# Patient Record
Sex: Female | Born: 1980 | Race: Black or African American | Hispanic: No | Marital: Married | Smoking: Never smoker
Health system: Southern US, Community
[De-identification: ages and names within clinical notes are randomized; demographics above are authoritative.]

## PROBLEM LIST (undated history)

## (undated) HISTORY — PX: TUBAL LIGATION: SHX77

## (undated) HISTORY — DX: Morbid (severe) obesity due to excess calories: E66.01

---

## 2001-01-24 ENCOUNTER — Emergency Department (HOSPITAL_COMMUNITY): Admission: EM | Admit: 2001-01-24 | Discharge: 2001-01-25 | Payer: Self-pay | Admitting: Internal Medicine

## 2001-02-02 ENCOUNTER — Other Ambulatory Visit: Admission: RE | Admit: 2001-02-02 | Discharge: 2001-02-02 | Payer: Self-pay | Admitting: Obstetrics and Gynecology

## 2001-03-18 ENCOUNTER — Other Ambulatory Visit: Admission: RE | Admit: 2001-03-18 | Discharge: 2001-03-18 | Payer: Self-pay | Admitting: Obstetrics and Gynecology

## 2001-05-11 ENCOUNTER — Ambulatory Visit (HOSPITAL_COMMUNITY): Admission: AD | Admit: 2001-05-11 | Discharge: 2001-05-11 | Payer: Self-pay | Admitting: Obstetrics and Gynecology

## 2001-08-07 ENCOUNTER — Ambulatory Visit (HOSPITAL_COMMUNITY): Admission: RE | Admit: 2001-08-07 | Discharge: 2001-08-07 | Payer: Self-pay | Admitting: Obstetrics and Gynecology

## 2001-08-11 ENCOUNTER — Ambulatory Visit (HOSPITAL_COMMUNITY): Admission: AD | Admit: 2001-08-11 | Discharge: 2001-08-11 | Payer: Self-pay | Admitting: Neurology

## 2001-08-14 ENCOUNTER — Ambulatory Visit (HOSPITAL_COMMUNITY): Admission: AD | Admit: 2001-08-14 | Discharge: 2001-08-14 | Payer: Self-pay | Admitting: Obstetrics and Gynecology

## 2001-08-18 ENCOUNTER — Ambulatory Visit (HOSPITAL_COMMUNITY): Admission: AD | Admit: 2001-08-18 | Discharge: 2001-08-18 | Payer: Self-pay | Admitting: Obstetrics and Gynecology

## 2001-08-21 ENCOUNTER — Ambulatory Visit (HOSPITAL_COMMUNITY): Admission: AD | Admit: 2001-08-21 | Discharge: 2001-08-21 | Payer: Self-pay | Admitting: Obstetrics and Gynecology

## 2001-08-24 ENCOUNTER — Inpatient Hospital Stay (HOSPITAL_COMMUNITY): Admission: RE | Admit: 2001-08-24 | Discharge: 2001-08-27 | Payer: Self-pay | Admitting: Obstetrics and Gynecology

## 2001-08-28 ENCOUNTER — Emergency Department (HOSPITAL_COMMUNITY): Admission: EM | Admit: 2001-08-28 | Discharge: 2001-08-29 | Payer: Self-pay | Admitting: *Deleted

## 2003-01-06 ENCOUNTER — Observation Stay (HOSPITAL_COMMUNITY): Admission: AD | Admit: 2003-01-06 | Discharge: 2003-01-07 | Payer: Self-pay | Admitting: Obstetrics and Gynecology

## 2003-01-07 ENCOUNTER — Encounter: Payer: Self-pay | Admitting: Obstetrics and Gynecology

## 2003-01-18 ENCOUNTER — Inpatient Hospital Stay (HOSPITAL_COMMUNITY): Admission: AD | Admit: 2003-01-18 | Discharge: 2003-01-18 | Payer: Self-pay | Admitting: Obstetrics and Gynecology

## 2003-06-19 ENCOUNTER — Observation Stay (HOSPITAL_COMMUNITY): Admission: RE | Admit: 2003-06-19 | Discharge: 2003-06-20 | Payer: Self-pay | Admitting: Obstetrics and Gynecology

## 2003-07-04 ENCOUNTER — Inpatient Hospital Stay (HOSPITAL_COMMUNITY): Admission: RE | Admit: 2003-07-04 | Discharge: 2003-07-07 | Payer: Self-pay | Admitting: Obstetrics and Gynecology

## 2005-10-04 ENCOUNTER — Ambulatory Visit (HOSPITAL_COMMUNITY): Admission: RE | Admit: 2005-10-04 | Discharge: 2005-10-04 | Payer: Self-pay | Admitting: Obstetrics and Gynecology

## 2005-10-27 ENCOUNTER — Ambulatory Visit (HOSPITAL_COMMUNITY): Admission: RE | Admit: 2005-10-27 | Discharge: 2005-10-27 | Payer: Self-pay | Admitting: Obstetrics and Gynecology

## 2005-12-22 ENCOUNTER — Ambulatory Visit (HOSPITAL_COMMUNITY): Admission: AD | Admit: 2005-12-22 | Discharge: 2005-12-22 | Payer: Self-pay | Admitting: Obstetrics and Gynecology

## 2005-12-25 ENCOUNTER — Encounter (INDEPENDENT_AMBULATORY_CARE_PROVIDER_SITE_OTHER): Payer: Self-pay | Admitting: *Deleted

## 2005-12-25 ENCOUNTER — Inpatient Hospital Stay (HOSPITAL_COMMUNITY): Admission: AD | Admit: 2005-12-25 | Discharge: 2005-12-28 | Payer: Self-pay | Admitting: Obstetrics and Gynecology

## 2009-12-27 ENCOUNTER — Ambulatory Visit: Payer: Self-pay | Admitting: Family Medicine

## 2009-12-27 DIAGNOSIS — R5381 Other malaise: Secondary | ICD-10-CM | POA: Insufficient documentation

## 2009-12-27 DIAGNOSIS — R5383 Other fatigue: Secondary | ICD-10-CM

## 2009-12-27 DIAGNOSIS — J309 Allergic rhinitis, unspecified: Secondary | ICD-10-CM | POA: Insufficient documentation

## 2010-01-09 ENCOUNTER — Telehealth: Payer: Self-pay | Admitting: Family Medicine

## 2010-02-12 ENCOUNTER — Ambulatory Visit: Payer: Self-pay | Admitting: Family Medicine

## 2010-02-12 ENCOUNTER — Other Ambulatory Visit: Admission: RE | Admit: 2010-02-12 | Discharge: 2010-02-12 | Payer: Self-pay | Admitting: Family Medicine

## 2010-02-15 LAB — CONVERTED CEMR LAB: Pap Smear: NEGATIVE

## 2010-11-13 NOTE — Assessment & Plan Note (Signed)
Summary: new pt per Dr. Lodema Hong to put in   Vital Signs:  Patient profile:   30 year old female Menstrual status:  regular LMP:     11/28/2009 Height:      60.5 inches Weight:      271.75 pounds BMI:     52.39 O2 Sat:      98 % Pulse rate:   76 / minute Pulse rhythm:   regular Resp:     16 per minute BP sitting:   118 / 82  (left arm) Cuff size:   xl  Vitals Entered By: Everitt Amber LPN (December 27, 2009 1:36 PM)  Nutrition Counseling: Patient's BMI is greater than 25 and therefore counseled on weight management options. CC: New Patient  LMP (date): 11/28/2009     Menstrual Status regular Enter LMP: 11/28/2009   CC:  New Patient .  History of Present Illness: New pt evaluation for this young femae whose primary complaints are obesity and uncontrolled allergies. She denies any recent fever or chills, she suffers frm fatigue, and denies sleep disturbance. she denies depression or anxiety. She denies any recent head or chest congestion. She denies dysuria or frequency or incontinence.  Preventive Screening-Counseling & Management  Alcohol-Tobacco     Smoking Status: never  Caffeine-Diet-Exercise     Does Patient Exercise: no  Current Medications (verified): 1)  None  Allergies (verified): 1)  ! Hydrocodone 2)  ! Pcn  Past History:  Past Medical History: obesity ectopic pregnancy 2008.  Past Surgical History: Caesarean section x 3  laparoscopy following pseudopregnancy in 2010 bTL 2007  Family History: Mother-Living-Asthma  Father-Living-Sleep apnea  3 brothers-1 diabetic, 2 healthy  Social History: Occupation: Dipl[employer] Services operational officer  Married since 2007 Never Smoked Alcohol use-no Regular exercise-no 3 children, 2 girls and 1 boy Smoking Status:  never Does Patient Exercise:  no  Review of Systems      See HPI General:  Complains of fatigue; denies chills and fever. Eyes:  Complains of vision loss-both eyes; poor night vision , since age 67 has had  glasses. ENT:  Denies earache, hoarseness, nasal congestion, and sinus pressure. CV:  Denies chest pain or discomfort, palpitations, and swelling of feet. Resp:  Denies cough and sputum productive. GI:  Denies abdominal pain, constipation, diarrhea, nausea, and vomiting. GU:  Denies dysuria and urinary frequency. MS:  Complains of joint pain and stiffness; intermittent right ankle pain and swelling 2 to 3 times per week, related footwear. Derm:  Denies itching and rash. Neuro:  Denies headaches, seizures, and sensation of room spinning. Psych:  Denies anxiety and depression. Endo:  Denies excessive thirst and excessive urination. Heme:  Denies abnormal bruising and bleeding. Allergy:  Complains of itching eyes, seasonal allergies, and sneezing; denies hives or rash; worse in the Summer and fall.  Physical Exam  General:  Well-developed,morbidly obese,in no acute distress; alert,appropriate and cooperative throughout examination HEENT: No facial asymmetry,  EOMI, No sinus tenderness, TM's Clear, oropharynx  pink and moist.   Chest: Clear to auscultation bilaterally.  CVS: S1, S2, No murmurs, No S3.   Abd: Soft, Nontender.  MS: Adequate ROM spine, hips, shoulders and knees.  Ext: No edema.   CNS: CN 2-12 intact, power tone and sensation normal throughout.   Skin: Intact, no visible lesions or rashes.  Psych: Good eye contact, normal affect.  Memory intact, not anxious or depressed appearing.    Impression & Recommendations:  Problem # 1:  FATIGUE (ICD-780.79) Assessment Comment Only  Orders:  T-Basic Metabolic Panel [id number]) T-CBC w/Diff [phone]) T-TSH [id number])  Problem # 2:  ALLERGIC RHINITIS CAUSE UNSPECIFIED (ICD-477.9) Assessment: Deteriorated  Her updated medication list for this problem includes:    Zyrtec Hives Relief 10 Mg Tabs (Cetirizine hcl) .Marland Kitchen... Take 1 tablet by mouth once a day  Problem # 3:  MORBID OBESITY (ICD-278.01) Assessment: Comment  Only  Ht: 60.5 (12/27/2009)   Wt: 271.75 (12/27/2009)   BMI: 52.39 (12/27/2009) pt to start phentermne along with lifestyle modification  Complete Medication List: 1)  Phentermine Hcl 37.5 Mg Tabs (Phentermine hcl) .... Take 1 tablet by mouth once a day 2)  Zyrtec Hives Relief 10 Mg Tabs (Cetirizine hcl) .... Take 1 tablet by mouth once a day  Other Orders: T-Lipid Profile ([id number]) Tdap => 50yrs IM ([address]3235) Admin 1st Vaccine ([address]322) Admin 1st Vaccine [facility]) [phone])  Patient Instructions: 1)  cPE in 7 weeks 2)  It is important that you exercise regularly at least 30 minutes 5 times a week. If you develop chest pain, have severe difficulty breathing, or feel very tired , stop exercising immediately and seek medical attention. 3)  You need to lose weight. Consider a lower calorie diet and regular exercise. goal is 1 to 2 pounds per week. aim for 7 to 12 pounds 4)  BMP prior to visit, ICD-9: 5)  Lipid Panel prior to visit, ICD-9: fasting asap 6)  TSH prior to visit, ICD-9: 7)  CBC w/ Diff prior to visit, ICD-9: 8)  you will get tDAp today Prescriptions: ZYRTEC HIVES RELIEF 10 MG TABS (CETIRIZINE HCL) Take 1 tablet by mouth once a day  #30 x 3   Entered and Authorized by:   Syliva Overman MD   Signed by:   Syliva Overman MD on 01/09/2010   Method used:   Electronically to        [facility]  [address] [address] [id number]ail[facility]       [address]       [place], [place]  [address]       Ph: [phone] or [phone]       Fax: [phone]   RxID:   [id number] PHENTERMINE HCL 37.5 MG TABS (PHENTERMINE HCL) Take 1 tablet by mouth once a day  #30 x 0   Entered and Authorized by:   Syliva Overman MD   Signed by:   Syliva Overman MD on 12/27/2009   Method used:   Printed then faxed to ...       [facility]  [address] [address] [id number]       [address]       [place], [place]  [address]       Ph: [phone] or [phone]       Fax:  [phone]   RxID:   [id number]    Tetanus/Td Vaccine    Vaccine Type: Tdap    Site: left deltoid    Mfr: Sanofi Pasteur    Dose: 0.5 ml    Route: IM    Given by: Everitt Amber LPN    Exp. Date: 01/2012    Lot #: [id number]

## 2010-11-13 NOTE — Assessment & Plan Note (Signed)
Summary: PHY   Vital Signs:  Patient profile:   30 year old female Menstrual status:  regular Height:      60.5 inches Weight:      258 pounds BMI:     49.74 O2 Sat:      98 % Pulse rate:   92 / minute Pulse rhythm:   regular Resp:     16 per minute BP sitting:   120 / 82  (left arm) Cuff size:   xl   Vitals Entered By: Everitt Amber LPN (Feb 13, 1600 8:54 AM)  Nutrition Counseling: Patient's BMI is greater than 25 and therefore counseled on weight management options. CC: CPE  Vision Screening:Left eye w/o correction: 20 / 20 Right Eye w/o correction: 20 / 20 Both eyes w/o correction:  20/ 20  Color vision testing: normal      Vision Entered By: Everitt Amber LPN (Feb 13, 931 8:55 AM)   CC:  CPE.  History of Present Illness: Reports  that she has been doing very well as fa as lifestyle change and eating is concerned. She ahs not been asdiligent with exercise, but intends to improve this. She denies any adverse side effects from phentermine. Denies recent fever or chills. Denies sinus pressure, nasal congestion , ear pain or sore throat. She does report increased and uncontrolled seasonal allergies. Denies chest congestion, or cough productive of sputum. Denies chest pain, palpitations, PND, orthopnea or leg swelling. Denies abdominal pain, nausea, vomitting, diarrhea or constipation. Denies change in bowel movements or bloody stool. Denies dysuria , frequency, incontinence or hesitancy. Denies  joint pain, swelling, or reduced mobility. Denies headaches, vertigo, seizures. Denies depression, anxiety or insomnia. Denies  rash, lesions, or itch.     Current Medications (verified): 1)  Phentermine Hcl 37.5 Mg Tabs (Phentermine Hcl) .... Take 1 Tablet By Mouth Once A Day 2)  Zyrtec Hives Relief 10 Mg Tabs (Cetirizine Hcl) .... Take 1 Tablet By Mouth Once A Day  Allergies (verified): 1)  ! Hydrocodone 2)  ! Pcn  Review of Systems      See HPI General:  Denies  chills, fatigue, fever, sleep disorder, sweats, and weakness. Eyes:  Denies blurring and discharge. Endo:  Denies cold intolerance, excessive hunger, excessive thirst, excessive urination, heat intolerance, polyuria, and weight change. Heme:  Denies abnormal bruising and bleeding. Allergy:  Complains of seasonal allergies; increased symptoms zyrtec only is inadequate.  Physical Exam  General:  Well-developed,obese,in no acute distress; alert,appropriate and cooperative throughout examination Head:  Normocephalic and atraumatic without obvious abnormalities. No apparent alopecia or balding. Eyes:  No corneal or conjunctival inflammation noted. EOMI. Perrla. Funduscopic exam benign, without hemorrhages, exudates or papilledema. Vision grossly normal. Ears:  External ear exam shows no significant lesions or deformities.  Otoscopic examination reveals clear canals, tympanic membranes are intact bilaterally without bulging, retraction, inflammation or discharge. Hearing is grossly normal bilaterally. Nose:  External nasal examination shows no deformity or inflammation. Nasal mucosa are pink and moist without lesions or exudates. Mouth:  Oral mucosa and oropharynx without lesions or exudates.  Teeth in good repair. Neck:  No deformities, masses, or tenderness noted. Chest Wall:  No deformities, masses, or tenderness noted. Breasts:  No mass, nodules, thickening, tenderness, bulging, retraction, inflamation, nipple discharge or skin changes noted.   Lungs:  Normal respiratory effort, chest expands symmetrically. Lungs are clear to auscultation, no crackles or wheezes. Heart:  Normal rate and regular rhythm. S1 and S2 normal without gallop, murmur,  click, rub or other extra sounds. Abdomen:  Bowel sounds positive,abdomen soft and non-tender without masses, organomegaly or hernias noted. Genitalia:  Normal introitus for age, no external lesions, no vaginal discharge, mucosa pink and moist, no vaginal or  cervical lesions, no vaginal atrophy, no friaility or hemorrhage, normal uterus size and position, no adnexal masses or tenderness Msk:  No deformity or scoliosis noted of thoracic or lumbar spine.   Pulses:  R and L carotid,radial,femoral,dorsalis pedis and posterior tibial pulses are full and equal bilaterally Extremities:  No clubbing, cyanosis, edema, or deformity noted with normal full range of motion of all joints.   Neurologic:  No cranial nerve deficits noted. Station and gait are normal. Plantar reflexes are down-going bilaterally. DTRs are symmetrical throughout. Sensory, motor and coordinative functions appear intact. Skin:  Intact without suspicious lesions or rashes Cervical Nodes:  No lymphadenopathy noted Axillary Nodes:  No palpable lymphadenopathy Inguinal Nodes:  No significant adenopathy Psych:  Cognition and judgment appear intact. Alert and cooperative with normal attention span and concentration. No apparent delusions, illusions, hallucinations   Impression & Recommendations:  Problem # 1:  SCREENING FOR MALIGNANT NEOPLASM OF THE CERVIX (ICD-V76.2) Assessment Comment Only  Orders: Pap Smear ([address])  Problem # 2:  ALLERGIC RHINITIS CAUSE UNSPECIFIED (ICD-477.9) Assessment: Deteriorated  Her updated medication list for this problem includes:    Zyrtec Hives Relief 10 Mg Tabs (Cetirizine hcl) .Marland Kitchen... Take 1 tablet by mouth once a day    Flonase 50 Mcg/act Susp (Fluticasone propionate) .Marland Kitchen... 2 puffs per nostril daily  Problem # 3:  MORBID OBESITY (ICD-278.01) Assessment: Improved  Ht: 60.5 (02/12/2010)   Wt: 258 (02/12/2010)   BMI: 49.74 (02/12/2010), pt applauded on a 13 pound weight loss,she is to commit too daily exercise and continue phentermine half daily  Complete Medication List: 1)  Phentermine Hcl 37.5 Mg Tabs (Phentermine hcl) .... Take 1 tablet by mouth once a day 2)  Zyrtec Hives Relief 10 Mg Tabs (Cetirizine hcl) .... Take 1 tablet by mouth once a  day 3)  Flonase 50 Mcg/act Susp (Fluticasone propionate) .... 2 puffs per nostril daily  Other Orders: T-Vitamin D (25-Hydroxy) ([id number])  Patient Instructions: 1)  Please schedule a follow-up appointment in 3 months. 2)  It is important that you exercise regularly at least 30 minutes 5 times a week. If you develop chest pain, have severe difficulty breathing, or feel very tired , stop exercising immediately and seek medical attention. 3)  You need to lose weight. Consider a lower calorie diet and regular exercise.  4)  Add vit D to labs Prescriptions: PHENTERMINE HCL 37.5 MG TABS (PHENTERMINE HCL) Take 1 tablet by mouth once a day  #30 x 0   Entered by:   Everitt Amber LPN   Authorized by:   Syliva Overman MD   Signed by:   Everitt Amber LPN on 96/29/5284   Method used:   Printed then faxed to ...       [facility]  [address] [address] [id number]       [address]       [place], [place]  [address]       Ph: [phone] or [phone]       Fax: [phone]   RxID:   [id number] FLONASE 50 MCG/ACT SUSP (FLUTICASONE PROPIONATE) 2 puffs per nostril daily  #1 x 3   Entered and Authorized by:   Syliva Overman MD  Signed by:   Syliva Overman MD on 02/12/2010   Method used:   [facility]ron[facility]        [facility]  [address] [address] [id number]       [address]       [place], [place]  [address]       Ph: [phone] or [phone]       Fax: [phone]   RxID:   [id number]

## 2010-11-13 NOTE — Progress Notes (Signed)
  Phone Note Call from Patient   Caller: Patient Summary of Call: patient called asking about an allergy med Initial call taken by: Adella Hare LPN,  January 09, 2010 1:36 PM  Follow-up for Phone Call        advise zyrtec sebnt in but no guarantee ins will cover since its otc, ok to buy it otc if ins will  not cover Follow-up by: Syliva Overman MD,  January 09, 2010 8:46 PM  Additional Follow-up for Phone Call Additional follow up Details #1::        called patient, left message Additional Follow-up by: Adella Hare LPN,  January 10, 2010 3:27 PM    Additional Follow-up for Phone Call Additional follow up Details #2::    patient aware Follow-up by: Adella Hare LPN,  January 11, 2010 11:30 AM

## 2011-03-01 NOTE — Consult Note (Signed)
NAMESHERBY, MONCAYO               ACCOUNT NO.:  [id number]   MEDICAL RECORD NO.:  [id number]          PATIENT TYPE:  OIB   LOCATION:  A415                          FACILITY:  [facility]   PHYSICIAN:  Tilda Burrow, M.D. DATE OF BIRTH:  [birth year]   DATE OF CONSULTATION:  DATE OF DISCHARGE:                                   CONSULTATION   HISTORY OF PRESENT ILLNESS:  [name] is 26-1/[redacted] weeks gestation.  This is her  third pregnancy.  She is in with complaints of lower back pain.  She denies  any dysuria.  Does have some suprapubic tenderness.  Urine dipped out 2+  white blood cells and a small amount of blood, vaginal exam revealed a  cervix that was long, thick, closed and firm.  Presenting part is high.  Fetal heart rate is confirmed in the 150 range.  There is no uterine  activity at this point.   IMPRESSION:  1.  IUP at 26-1/[redacted] weeks gestation.  2.  Urinary tract infection.  Will treat her with Macrobid 100 mg p.o.      b.i.d. x7, and continue to monitor her for an hour just to make sure      there is no uterine activity.      Jacklyn Shell, C.N.M.      Tilda Burrow, M.D.  Electronically Signed    FC/MEDQ  D:  10/04/2005  T:  10/04/2005  Job:  [id number]

## 2011-03-01 NOTE — Discharge Summary (Signed)
   [name], [name]                         ACCOUNT NO.:  [id number]   MEDICAL RECORD NO.:  [id number]                   PATIENT TYPE:  INP   LOCATION:  A427                                 FACILITY:  [facility]   PHYSICIAN:  Tilda Burrow, M.D.              DATE OF BIRTH:  [birth year]   DATE OF ADMISSION:  07/04/2003  DATE OF DISCHARGE:  07/07/2003                                 DISCHARGE SUMMARY   PROCEDURE:  Low transverse cesarean section.   Postoperative course was essentially uneventful.  She did require Hemabate  in surgery for some bleeding but, otherwise, postoperative recovery was  uneventful.  Postoperative laboratories are as follows:  Hemoglobin 9,  hematocrit 27.5, platelets were 1000.   DISCHARGE MEDICATIONS:  1. Motrin 800, 1 p.o. q.6-8h. p.r.n. cramping.  2. Chromagen Forte 1 p.o. b.i.d.  3. Tylox 1-2 p.o. q.4h. p.r.n. pain.   PHYSICAL EXAM UPON DISCHARGE:  HEART:  Regular to rhythm and rate.  LUNGS:  Clear to auscultation bilaterally.  ABDOMEN:  Bowel sounds are present.  Incision is dry, intact, showing no  signs of redness or drainage.  Jackson-Pratt tube had approximately 3-4 mL  of serous drainage in it.  JP was removed today without any complications.   FOLLOW-UP:  The patient is to come back to the office Monday for staple  removal.  I discussed signs and symptoms of infection with the patient.  She  knows if her temperature goes above 101 she is to notify the office, or if  she has any complications or concerns she is to notify the office.     _____________________________________  ___________________________________________  Zerita Boers, N.M.                    Tilda Burrow, M.D.   DL/MEDQ  D:  16/07/9603  T:  07/07/2003  Job:  [id number]

## 2011-03-01 NOTE — H&P (Signed)
[facility]  Patient:    [name], [name] Visit Number: [id number] MRN: [id number]          Service Type: OBS Location: 4A A427 01 Attending Physician:  Tilda Burrow Dictated by:   Zerita Boers, C.N.M. Admit Date:  08/24/2001   CC:         Lilyan Punt, M.D.  [facility]   History and Physical  ADMITTING DIAGNOSIS: Pregnancy at 40 weeks, with spontaneous rupture of membranes at 5 a.m.  HISTORY OF PRESENT ILLNESS: [name] awoke this morning at 5 a.m. with spontaneous rupture of membranes and irregular uterine contractions.  She is being admitted now with early labor and rupture of membranes.  PAST MEDICAL HISTORY: Negative.  PAST SURGICAL HISTORY: Negative.  ALLERGIES: PENICILLIN gives her a rash.  MEDICATIONS:  1. Prenatal vitamins.  2. Iron.  SOCIAL HISTORY: She is single.  She lives with her parents.  FAMILY HISTORY: Positive for type 1 diabetes.  PRENATAL HISTORY: Prenatal course was complicated by polyhydramnios and that was monitored with serial ultrasounds.  Blood type is B-positive.  UDS is negative.  Rubella is immune.  Hepatitis B surface antigen is negative.  HIV is negative.  Pap smear is class 1.  GC and Chlamydia, Chlamydia was positive with treatment and repeated cultures were negative on March 18, 2001.  She declined MSAFP.  GBS is positive.  Hemoglobin at 28 weeks is 7.8, hematocrit 34.2.  One hour glucose tolerance was 93.  At that time the patient was started on iron supplement.  Sickle cell screen was negative.  PHYSICAL EXAMINATION:  VITAL SIGNS: Stable.  Estimated fetal weight is approximately 7 pounds.  PLAN: We are going to admit for labor and possible Pitocin augmentation. Dictated by:   Zerita Boers, C.N.M. Attending Physician:  Tilda Burrow DD:  08/24/01 TD:  08/24/01 Job: [id number]

## 2011-03-01 NOTE — Discharge Summary (Signed)
[name], [name]               ACCOUNT NO.:  [id number]   MEDICAL RECORD NO.:  [id number]          PATIENT TYPE:  INP   LOCATION:  A403                          FACILITY:  [facility]   PHYSICIAN:  Lazaro Arms, M.D.   DATE OF BIRTH:  06/13/1981   DATE OF ADMISSION:  12/25/2005  DATE OF DISCHARGE:  03/17/2007LH                                 DISCHARGE SUMMARY   DISCHARGE DIAGNOSES:  1.  Status post repeat cesarean section.  2.  Tubal ligation.   PROCEDURE:  Repeat cesarean section with tubal ligation.   HISTORY OF PRESENT ILLNESS:  Please refer to the transcribed History and  Physical as well as the antepartum chart for details of admission to the  hospital.   HOSPITAL COURSE:  The patient was admitted.  She had been scheduled to  undergo a cesarean section next week because of previous C-section and also  tubal ligation.  However, she came in at 38-1/2 weeks with spontaneous  rupture of membranes.  As a result, she underwent a C-section at this point  with tubal ligation.  Her postoperative course has been unremarkable.  She  tolerated clear liquids and a regular diet.  She is voiding without  symptoms.  She has been ambulatory.  Hemoglobin on postop day #1, was 8.7,  hematocrit 27.3.  On postop day #3, it was 8.5 and 26.7 with white count  9000.  The corresponds favorably with a preop of 10.6 and 32.9.  She has  remained afebrile.  She has had flatus.  Her incision is clean, dry and  intact.  Her abdominal exam is benign.  She is discharged to home on the  morning of postop day #3 in good and stable condition.   FOLLOW UP:  Follow up in the office on Wednesday to get her staples out.   DISCHARGE MEDICATIONS:  Darvocet and Motrin for pain.      Lazaro Arms, M.D.  Electronically Signed     LHE/MEDQ  D:  12/28/2005  T:  12/29/2005  Job:  [id number]

## 2011-03-01 NOTE — Op Note (Signed)
[facility]  Patient:    [name], SOOTS Visit Number: [id number] MRN: [id number]          Service Type: EMS Location: ED Attending Physician:  Ilean Skill Dictated by:   Langley Gauss, M.D. Proc. Date: 08/24/01 Admit Date:  08/28/2001 Discharge Date: 08/29/2001   CC:         Christin Bach, M.D.   Operative Report  PREOPERATIVE DIAGNOSES: 1. Term intrauterine pregnancy, in labor. 2. Prolonged fetal heart rate decelerations, non-reassuring fetal heart rate.  POSTOPERATIVE DIAGNOSES:  PROCEDURE PERFORMED:  Emergent primary low transverse cesarean section with delivery of a viable vigorous infant.  SURGEON:  Langley Gauss, M.D.  ESTIMATED BLOOD LOSS:  800 cc.  ANESTHESIA:  General endotracheal.  COMPLICATIONS:  None.  SPECIMENS:  Arterial cord gas and cord blood to pathology laboratory. Placenta is likewise examined and noted to be apparently intact with a three-vessel umbilical cord; this is likewise sent off for pathological study.  PEDIATRICIAN:  Lilyan Punt, M.D.  SUMMARY:  The patient was noted to have presented to labor and delivery in the early stages of active labor.  During the course of labor, she was noted to have the onset of prolonged variable late-type decelerations and thus the decision was made to proceed with emergent low transverse cesarean section. The Pitocin was discontinued with cessation of uterine activities.  The prolonged decelerations resolved.  Dr. Christin Bach was caring for the patient at that time and made all the preparations for the patient to undergo emergent low transverse cesarean section.  I became involved in the patients care when the patient was in the operating room on the first floor on a fetal heart rate monitor at that time with a reassuring fetal heart rate showing with no further fetal heart rate decelerations, with cessation of uterine activity.  Dr. Christin Bach was also present and  actively involved in making preparations for the low transverse cesarean section; this was between 7:30 and 8 oclock in the morning.  Dr. Christin Bach had another patients surgical procedure scheduled in an adjacent room, with patient already intubated under general endotracheal anesthesia, thus due to the emergent nature of the situation, he asked if I would be available to perform the low transverse cesarean section on this patient.  So that he could care for his other patient, I agreed to do so.  DESCRIPTION OF PROCEDURE:  Patient was placed on the OR table in a slight left lateral tilt.  Continuous electronic fetal monitoring was performed.  Patient was prepped and draped in the usual sterile manner, placed on the OR table with a slight left lateral tilt.  She then underwent uncomplicated induction of general endotracheal anesthesia.  Foley catheter had been previously placed to straight drainage with findings of clear-yellow urine.  A sharp knife was then used to incise a Pfannenstiel incision through the skin.  We dissected down to the fascial plane utilizing a sharp knife, cauterizing bleeders along the way.  The fascia was then incised in a transverse curvilinear manner utilizing the Mayo scissors.  Fascial edges were then grasped utilizing straight Kocher clamps and fascia was dissected off the underlying rectus muscle in the midline utilizing the Mayo scissors.  The rectus muscle was then bluntly separated; peritoneal cavity was atraumatically bluntly entered at the superior-most portion of the incision.  Peritoneal incision is then extended superiorly and inferiorly, inferiorly directly visualized and palpated the bladder to avoid its accidental injury.  A bladder blade was then  placed.  The lower uterine segment was identified.  A bladder flap was then created from the vesicouterine fold overlying the lower uterine segment by dissecting the avascular plane.  The bladder flap  was then separated off the anterior lower uterine segment.  Bladder blade is then placed, lower uterine segment is identified and noted to be very thin in nature due to the previous labor.  A sharp knife is then used to score a low transverse uterine incision.  The intact amniotic sac is encountered in the midline of this incision.  My index fingers were then used to bluntly extend the low transverse uterine incision. Allis clamp is used to artificially rupture the membrane.  The infant was noted to be in an OP position, cephalic presentation.  My right hand then reached into the uterine cavity, the head of the infant was flexed and elevated and with fundal pressure, was directed to the uterine incision.  The reasonable Silastic suction connected to wall suction was then placed on the infants vertex.  Gentle suction was then applied utilizing wall suction only; this in combination with gentle traction and fundal pressure resulted in easy delivery of the vertex through the uterine incision without extension.  Mouth and nares of the infant were bulb-suctioned of clear amniotic fluid.  Gentle traction and fundal pressure then resulted in delivery of the remainder of the infant without difficulty.  Spontaneous and vigorous breathe and cry were noted.  The umbilical cord is milked towards the infant, cord is doubly clamped and cut and infant is handed to the awaiting pediatrician, Dr. Lilyan Punt.  Good tone is noted as well as good color.  Of note, a nuchal cord was not encountered at time of delivery.  Arterial cord gas and cord blood are then obtained from the placenta.  Gentle traction on the umbilical cord results in spontaneous separation, which upon examination appears to be an intact three-vessel placenta.  The uterus is then exteriorized.  Intrauterine exploration reveals no retained placental fragments.  Some small membranous fragments are removed from the posterior lower uterine  segment.  The uterine incision is examined and noted to have not  extended, thus it is easily closed in a 0 chromic in a running-locked fashion, second layer being an imbricating layer overlying the first; this results in restoration of the normal anatomy.  A single figure-of-eight suture is required about two-thirds of the way to the right apex of the incision to result in excellent hemostasis and restore the normal anatomy of the uterine incision.  Tubes and ovaries are noted to be normal in appearance at this time.  Sponge, needle and instrument counts are correct, thus cul-de-sac is irrigated free of all clots at this time and uterus returned to the pelvic cavity.  The peritoneal edges are then grasped using Kelly clamps.  The peritoneal gutters are then irrigated with normal saline until clear.  The peritoneum is then closed with a continuous running layer of 0 chromic suture in a simple continuous fashion; this is then followed by reapproximation of the rectus muscle utilizing continuous running 0 chromic suture overlying the peritoneal closure.  Subfascial bleeders are then cauterized.  The fascia is then closed with a continuous running #1 PDS suture.  Subcutaneous bleeders are then cauterized.  Three interrupted #1 PDS sutures are placed through and through the skin edges to facilitate stapling.  The skin is then completely closed utilizing staples.  Following this, the three retention-type sutures are removed; the closure is  noted to be intact.  Patient tolerated the procedure very well.  She was extubated and taken to recovery room in stable condition.  I did contact the nursery at that time; the baby was noted to be doing very well in the newborn nursery.  Dictated by:   Langley Gauss, M.D.  Attending Physician:  Ilean Skill DD:  08/31/01 TD:  08/31/01 Job: [id number]

## 2011-03-01 NOTE — H&P (Signed)
[name], [name]               ACCOUNT NO.:  [id number]   MEDICAL RECORD NO.:  [id number]          PATIENT TYPE:  INP   LOCATION:  LDR4                          FACILITY:  [facility]   PHYSICIAN:  Richardean Canal, M.D.  DATE OF BIRTH:  1980/11/30   DATE OF ADMISSION:  12/25/2005  DATE OF DISCHARGE:  LH                                HISTORY & PHYSICAL   HISTORY:  This 30 year old black female gravida 3, para 2 with [facility] January 03, 2006 was admitted at 38-1/2 gestation with spontaneous rupture of membranes  at home. The patient had been scheduled for repeat Cesarean section. The  patient states that the fluid was clear. On admission, the patient was  having contractions and was given terbutaline. A fetal monitor strip was run  showing reactive NST with good variability and no decelerations. The patient  has requested bilateral tubal ligation at time of Cesarean section.   PAST MEDICAL HISTORY:  Medical:  Negative. Surgical:  Cesarean section x2.   FAMILY HISTORY:  Negative.   DRUG ALLERGIES:  The patient is allergic to TYLOX and PENICILLIN, both of  which cause hives.   MEDICATIONS:  Prenatal vitamins and iron.   PHYSICAL EXAMINATION:  GENERAL:  The patient appears a well-developed, well-  nourished, term pregnant black female, having occasional contractions in no  acute distress.  VITAL SIGNS:  Blood pressure 120/71.  HEENT:  Negative.  HEART:  There is a normal sinus rhythm. No murmurs are heard.  LUNGS:  Clear.  ABDOMEN:  Fetus is presenting vertex and the fundus is term size. There is a  Pfannenstiel scar on the abdomen. Uterus is soft and nontender.  EXTREMITIES:  Negative.  PELVIC:  Deferred.   IMPRESSION:  Intrauterine gestation, 38-1/2 weeks. History of prior Cesarean  section. Spontaneous rupture of membranes. Desire for sterilization.   DISPOSITION:  The patient will undergo repeat Cesarean section and bilateral  tubal ligation. The patient has been counseled for  surgery including but not  limited to complications of surgery of hemorrhage, transfusion, infection  and injury to an internal organ. The patient has also been counseled that  bilateral tubal ligation carries a 1% failure rate and that the procedure is  not guaranteed. The entire conversation was witnessed by the husband. The  patient and husband agreed to the surgical plan and have expressed  understanding.                                            ______________________________  Richardean Canal, M.D.     RW/MEDQ  D:  12/25/2005  T:  12/25/2005  Job:  [id number]

## 2011-03-01 NOTE — H&P (Signed)
[name], [name]                         ACCOUNT NO.:  [id number]   MEDICAL RECORD NO.:  [id number]                   PATIENT TYPE:   LOCATION:                                       FACILITY:  [facility]   PHYSICIAN:  Tilda Burrow, M.D.              DATE OF BIRTH:  [birth year]   DATE OF ADMISSION:  DATE OF DISCHARGE:                                HISTORY & PHYSICAL   ANTICIPATED DATE OF ADMISSION:  July 04, 2003   ADMISSION DIAGNOSES:  1. Pregnancy, [redacted] weeks gestation.  2. Prior cesarean section, not for trial of labor.  3. Scheduled for repeat cesarean section.   HISTORY OF PRESENT ILLNESS:  This 30 year old female gravida 2, para 1, AB  0, LMP October 03, 2002, placing  menstrual EDC at September 27th with  corresponding first and second trimester ultrasounds is admitted at this  time after a pregnancy course followed through our office with 29 prenatal  visits.  [name] was seen in our office and has been scheduled for repeat  cesarean section at this time.  The patient does not plan permanent  contraception; and, has had an otherwise uncomplicated prenatal course.   PAST MEDICAL HISTORY:  Benign.   PAST SURGICAL HISTORY:  Negative.   ALLERGIES:  PENICILLIN.   MEDICATIONS:  Prenatal vitamins.   SOCIAL HISTORY:  Single.  Supportive father of the baby.   PHYSICAL EXAMINATION:  VITAL SIGNS:  Height 5 feet 1/4 inch.  Weight 204,  which is a 9-pound weight gain.  Blood pressure 118/70.  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  HEENT:  Pupils equal, round and react.  Extraocular movements intact.  NECK: Supple.  Trachea midline.  CHEST:  Clear to auscultation.  ABDOMEN:  Thickened abdomen with term size fetus, vertex presentation, with  presenting part out of the pelvis.  PELVIC EXAMINATION:  Deferred at this time.  Pap smear Class I, and GC and  Chlamydia negative early in  pregnancy.   PRENATAL LABORATORY DATA:  The remainder of labs include blood type A  positive, antibody screen negative.  Urine drug screen negative.  Rubella  immunity present. Hepatitis, HIV, GC and Chlamydia are al  negative.  The boy friend is HSV-2 positive.  The patient is reportedly  negative.  She has had no outbreaks with this pregnancy.  She declined MS  AFP and had a normal one-hour glucose challenge test.   IMPRESSION:  Pregnancy at term with repeat cesarean section on July 04, 2003.                                                 Tilda Burrow, M.D.    JVF/MEDQ  D:  06/22/2003  T:  06/23/2003  Job:  [id number]

## 2011-03-01 NOTE — Discharge Summary (Signed)
[facility]  Patient:    [name], [name] Visit Number: [id number] MRN: [id number]          Service Type: EMS Location: ED Attending Physician:  Ilean Skill Dictated by:   Zerita Boers, N.M. Admit Date:  08/28/2001 Discharge Date: 08/29/2001                             Discharge Summary  ADMITTING DIAGNOSIS:  Pregnancy at term with early labor with nonreassuring fetal heart rate pattern and emergency cesarean section performed by Dr. Lisette Grinder.  DISCHARGE DIAGNOSIS:  Term pregnancy delivered via low-transverse cesarean section for nonreassuring fetal heart rate in early labor with viable infant with Apgars of 9 and 9.  DISCHARGE CONDITION:  Stable on postoperative day three.  HISTORY OF PRESENT ILLNESS AND HOSPITAL COURSE:  This patient was a primigravida in early labor who had very deep variable decelerations down into the 30s.  She was given Brethine to stop her labor and Dr. Delbert Harness and Dr. Lisette Grinder were notified.  Due to the fact that Dr. Emelda Fear was in surgery Dr. Lisette Grinder attended the patient and performed an emergency primary cesarean section with an infant stable and Apgars of 9 and 9.  She had an uneventful postpartum, postoperative course.  She is being discharged, today, on August 27, 2001 stable and without any problems.  PHYSICAL EXAMINATION:  Vital signs are stable.  Heart rate is regular rate and rhythm.  Lungs are clear to auscultation bilaterally.  Abdomen is soft and nontender.  Incision is healing well.  It is intact.  There is no redness and no swelling.  Lochia is small amount.  There is no edema of her extremities.  DISCHARGE INSTRUCTIONS:  Plan:  We are going to discharge her home.  She is to follow up Monday for staple removal.  She is to notify us over the weekend of any impending problems.  DISCHARGE MEDICATIONS: 1. Tylox. 2. Motrin. Dictated by:   Zerita Boers, N.M. Attending Physician:  Ilean Skill DD:  08/27/01 TD:  08/27/01 Job: [id number]

## 2011-03-01 NOTE — Consult Note (Signed)
[name], [name]               ACCOUNT NO.:  [id number]   MEDICAL RECORD NO.:  [id number]          PATIENT TYPE:  OIB   LOCATION:  A415                          FACILITY:  [facility]   PHYSICIAN:  Tilda Burrow, M.D. DATE OF BIRTH:  [birth year]   DATE OF CONSULTATION:  12/22/2005  DATE OF DISCHARGE:  12/22/2005                                   CONSULTATION   ADMISSION DIAGNOSES:  Pregnancy at 37+ weeks gestation with nausea and  vomiting, probably gastroenteritis with observation x2 hours.   HISTORY OF PRESENT ILLNESS:  This 30 year old gravida 3, para 2, prior  cesarean section scheduled for repeat C-section and tubal ligation is seen  at this time complaining of loose bowel movement overnight with two episodes  of vomiting.  She became concerned and came in for reassurance.  External  monitoring shows no uterine contractions.  Reactive NSD criteria is met.  Urinalysis shows specific gravity of 1.025 with negative hemoglobin with no  evidence of UTI.  There is some moderate ketones greater than 80 mg/dL.  The  patient feels that she can keep crackers down at this time, predominantly  diarrhea.  Abdominal exam shows bowel sounds present.  No rebound tenderness  or guarding.  Again, there is no uterine activity.   IMPRESSION:  Gastroenteritis without clinically significantly dehydration.   PLAN:  1.  BRAT diet.  2.  Phenergan 25 mg tablets p.o. q.4 h p.r.n. nausea.  3.  Clear liquid diet today.   FOLLOWUP:  Follow up in 24 hours if she does not improve for IV fluid  hydration if needed.  Otherwise, routine visit this week.      Tilda Burrow, M.D.  Electronically Signed     JVF/MEDQ  D:  12/22/2005  T:  12/23/2005  Job:  [id number]

## 2011-03-01 NOTE — Op Note (Signed)
[name], [name]                         ACCOUNT NO.:  [id number]   MEDICAL RECORD NO.:  [id number]                   PATIENT TYPE:  INP   LOCATION:  A427                                 FACILITY:  [facility]   PHYSICIAN:  Tilda Burrow, M.D.              DATE OF BIRTH:  June 25, 1981   DATE OF PROCEDURE:  DATE OF DISCHARGE:                                 OPERATIVE REPORT   PREOPERATIVE DIAGNOSIS:  Pregnancy 39 weeks, repeat cesarean section.   POSTOPERATIVE DIAGNOSIS:  Pregnancy 39 weeks, repeat cesarean section.   PROCEDURE:  1. Repeat low transverse cervical cesarean section.  2. Excision of cicatrix.   SURGEON:  Tilda Burrow, M.D.   ASSISTANTMarlinda Mike, RN   ANESTHESIA:  General   COMPLICATIONS:  None   FINDINGS:  Posterior fundal placental location, with extensive oozing from  the posterior lower edge of the placental bed necessitating Hemabate  injection into the uterus. Healthy female infant, Apgars 9 and 9.   DESCRIPTION OF PROCEDURE:  The patient was taken to the operating room  prepped and draped after spinal anesthesia introduced through a paramedial  access.  Adequate analgesia was confirmed.  The patient prepped and draped,  Foley catheter having been placed.  The old cicatrix was excised with an  additional 2-3 cm of skin above it to allow for an improved lower abdominal  appearance.  This was excised down to just above the fascia; and then the  fascia was opened transversely in the method of Pfannenstiel; and dissected  off the underlying rectus muscles with some difficulty due to old fibrosis.   The peritoneal cavity was safely and easily entered in the midline while  holding peritoneum upward and then the bladder flap developed on the very  thin, lower uterine segment which was no more than 3 mm thick.  A transverse  extension of the incision was done with index finger traction and counter  traction and then the fetal vertex rotated from its somewhat  military  position into the incision and delivered using fundal pressure.  The right  arm was delivered first and then the rest of the fetal body.  There was  clear amniotic fluid without malodor.  Bulb suctioning of the pharynx was  performed upon delivery to the head and shoulder and then the infant  delivered the rest of the way and then taken to Dr. Francoise Schaumann. Hal[place] for  further care.  See his notes for details.   Cord blood samples were obtained and placenta delivered [facility]  presentation, membranes intact.  The posterior placental bed showed a very  generous oozing tendency even though the uterus was firm and it was felt  like the lower edge was below the fibromuscular changes in the lower uterine  segment so Hemabate was used to inject directly into the myometrial bed  using 250 mcg of Hemabate in 10 cc of the  normal saline and injecting it  directly into the placental bed area.  Pressure for several minutes was then  performed and then the uterus closed.   IV antibiotic administration with Cleocin was performed.  A single layer of  running locking closure of the uterine incision was relatively hemostatic  with 2 additional figure-of-eight sutures required for adequate hemostasis.  The bladder flap had a varicosity on the back side of it that required  ligature.  We then proceeded with irrigation of the abdomen, and closure of  the anterior peritoneum with 2-0 chromic of the fascia with #0 Vicryl and  revision of the inferior aspects of the scar resulting in good tissue edge  approximation.  There was an oozing tendency that required point cautery in  several spots and the fascia and the subcu fatty tissue and then a flap JP  drain was placed just above the fascia.      ___________________________________________                                            Tilda Burrow, M.D.   JVF/MEDQ  D:  07/04/2003  T:  07/04/2003  Job:  [id number]

## 2011-03-01 NOTE — H&P (Signed)
[name], [name]                         ACCOUNT NO.:  [id number]   MEDICAL RECORD NO.:  [id number]                   PATIENT TYPE:  AMB   LOCATION:  DAY                                  FACILITY:  [facility]   PHYSICIAN:  Tilda Burrow, M.D.              DATE OF BIRTH:  June 13, 1981   DATE OF ADMISSION:  DATE OF DISCHARGE:                                HISTORY & PHYSICAL   ADMISSION DIAGNOSES:  Pregnancy, 39 weeks' gestation.  Prior cesarean  section, not for trial of labor.   HISTORY OF PRESENT ILLNESS:  This 30 year old female gravida 2, para 1, AB  0, LMP October 03, 2002, placing an initial [facility] of September 27 with  corresponding first and second trimester ultrasounds is admitted at this  time for repeat cesarean section. She has been seen in our office, followed  through prenatal care since November with 10 uncomplicated prenatal visits  with appropriate weight gain and fundal height growth.   The patient was seen in our office today and acknowledges the desire for  repeat cesarean section.  Presenting part is high out of the pelvis.  Plans  are for spinal anesthesia. She will be taking her baby to Dr. Conni Elliot of [facility]. Subsequently, Dr. Milford Cage has been notified of need for cesarean  attendance.   PAST MEDICAL HISTORY:  Benign.   PAST SURGICAL HISTORY:  Negative.   ALLERGIES:  PENICILLIN CAUSES A RASH.  She can take Ancef and Keflex.   SOCIAL HISTORY:  Single, baby's father is supportive.   PHYSICAL EXAMINATION:  VITAL SIGNS:  Height 5 feet, 1/4 inch, weight 204,  blood pressure 110/72, pulse 90.  GENERAL:  She is a healthy, African-American female, alert and oriented x3.  HEENT:  Pupils are equal, round and reactive to light, extraocular movements  intact.  NECK:  Supple, trachea midline.  LUNGS:  Clear to auscultation.  CARDIOVASCULAR:  Regular rate and rhythm.  ABDOMEN:  Nontender, 36-38 cm fundal height, estimated fetal weight 7  pounds.  PELVIC:   Cervix long and high, last checked.   LABORATORY DATA:  Blood type B positive, antibody screen negative, urine  drug screen negative, Rubella immunity present. Hepatitis, HIV, GC,  Chlamydia all negative.  The boyfriend is HSV positive, patient negative to  date with no reported suspicious lesions at the time of this visit.  Pap  smear within normal limits. Alpha fetoprotein declined. Glucose tolerance  test 92 mg.   PLAN:  Repeat cesarean section July 04, 2003.      ___________________________________________                                            Tilda Burrow, M.D.   JVF/MEDQ  D:  07/01/2003  T:  07/01/2003  Job:  [id number]  cc:   Francoise Schaumann. Milford Cage, D.O.  [address], [address]  [place]  [place] [address]  Fax: [phone]   Law, Dr.  [facility]

## 2011-03-01 NOTE — Op Note (Signed)
NAMEBRIGID, [name]               ACCOUNT NO.:  [id number]   MEDICAL RECORD NO.:  [id number]          PATIENT TYPE:  INP   LOCATION:  A403                          FACILITY:  [facility]   PHYSICIAN:  Richardean Canal, M.D.  DATE OF BIRTH:  [birth year]   DATE OF PROCEDURE:  12/25/2005  DATE OF DISCHARGE:                                 OPERATIVE REPORT   PREOPERATIVE DIAGNOSIS:  Intrauterine gestation, 38-1/2 weeks. History of  prior Cesarean section. Desire for sterilization.   POSTOPERATIVE DIAGNOSIS:  Intrauterine gestation, 38-1/2 weeks. History of  prior Cesarean section. Desire for sterilization.   PROCEDURE:  Repeat low transverse Cesarean section, bilateral Pomeroy tubal  ligation.   SURGEON:  Richardean Canal, M.D.   ANESTHESIA:  Spinal   DESCRIPTION OF OPERATIVE PROCEDURE:  Following induction of spinal  anesthesia, the patient was placed in supine position with a roll under the  right hip. A Foley catheter had previously been placed. The abdomen was  surgically prepped and draped. The abdomen was opened through a Pfannenstiel  incision. The uterovesical fold of the peritoneum was incised transversely  and bladder flap developed with blunt dissection. A transverse incision was  made in the lower uterine segment and extended with two index fingers.  Membranes were ruptured, and the fluid was clear. A disposable vacuum  extractor was placed on the head which was gently brought the incision. The  patient was delivered of a normal-appearing 6-pound 12-ounce female infant,  Apgar 9/9. The cord was clamped and divided and the newborn transferred to  [facility]. Halm, D.O., attending the newborn. Cord blood was collected.  Pitocin 20 units was added to the intravenous infusion and run rapidly.  Ancef 1 g was administered intravenously. The uterus was firmed up with  manual massage, and the placenta delivered and inspected. The placenta  appeared intact and normal. The uterus was  exteriorized. Uterine incision  was closed with a single layer of running 0 chromic suture. The line of  incision appeared dry. The right tube was identified by its fimbriated end  and tented with a Babcock clamp. A 0 plain tie was placed around the loop  which was excised and sent to pathology. The identical procedure was carried  out on the opposite site with positive identification of the left tube. The  operative sites appeared dry. Uterus was replaced in the abdomen. The  peritoneum was closed with a running 2-0 chromic suture. The rectus muscles  were approximated with three interrupted 0 chromic sutures. The fascia was  closed with two segmentally run 0 PDS sutures tied together in the midline.  The subcutaneous layer was irrigated with normal saline. The  subcutaneous layer was closed with a running 2-0 Vicryl suture. The skin was  closed with skin staples. Estimated blood loss was less than 500 cc. The  patient tolerated the procedure well and left the operative room in  satisfactory postoperative condition.           ______________________________  Richardean Canal, M.D.     RW/MEDQ  D:  12/25/2005  T:  12/26/2005  Job:  [id number] 

## 2011-05-29 ENCOUNTER — Encounter: Payer: Self-pay | Admitting: Family Medicine

## 2011-05-29 ENCOUNTER — Ambulatory Visit (INDEPENDENT_AMBULATORY_CARE_PROVIDER_SITE_OTHER): Payer: BC Managed Care – PPO | Admitting: Family Medicine

## 2011-05-29 VITALS — BP 100/70 | HR 100 | Ht 60.0 in | Wt 260.0 lb

## 2011-05-29 DIAGNOSIS — J309 Allergic rhinitis, unspecified: Secondary | ICD-10-CM

## 2011-05-29 DIAGNOSIS — Z13820 Encounter for screening for osteoporosis: Secondary | ICD-10-CM

## 2011-05-29 DIAGNOSIS — R5381 Other malaise: Secondary | ICD-10-CM

## 2011-05-29 MED ORDER — FLUTICASONE PROPIONATE 50 MCG/ACT NA SUSP: 1.0000 | Freq: Every day | NASAL | Status: DC

## 2011-05-29 MED ORDER — PHENTERMINE HCL 37.5 MG PO TABS: 37.5000 mg | ORAL_TABLET | Freq: Every day | ORAL | Status: DC

## 2011-05-29 NOTE — Progress Notes (Signed)
  Subjective:    Patient ID: [name], female    DOB: 1981/06/12, 30 y.o.   MRN: [id number]  HPI The PT is here for re-evaluation of chronic medical conditions, data.  Preventive health is updated, specifically  Cancer screening and Immunization.   Pt has concerns about her weight , wants help with weight loss. C/o increased and uncontrolled allegy symptoms     Review of Systems Denies recent fever or chills. Denies sinus pressure, nasal congestion, ear pain or sore throat.Allergies which were year round, less so now, better response to nasal spray than tabletsa  Denies chest congestion, productive cough or wheezing. Left chest pain intermittently sharp, for over 1 year, avg every 6 month, one episode , most recent was 5 days ago, thinks related to what she ate. Does have intermittent fluttering over 1 year also shortness of breath at times  Denies abdominal pain, nausea, vomiting,diarrhea or constipation.   Denies dysuria, frequency, hesitancy or incontinence. Denies joint pain, swelling and limitation in mobility. Denies headaches, seizures, numbness, or tingling. Denies depression, anxiety or insomnia. Denies skin break down or rash.        Objective:   Physical Exam Patient alert and oriented and in no cardiopulmonary distress.  HEENT: No facial asymmetry, EOMI, no sinus tenderness,  oropharynx pink and moist.  Neck supple no adenopathy.Nasal mucosa erythematous and edematous  Chest: Clear to auscultation bilaterally.  CVS: S1, S2 no murmurs, no S3.  ABD: Soft non tender. Bowel sounds normal.  Ext: No edema  MS: Adequate ROM spine, shoulders, hips and knees.  Skin: Intact, no ulcerations or rash noted.  Psych: Good eye contact, normal affect. Memory intact not anxious or depressed appearing.  CNS: CN 2-12 intact, power, tone and sensation normal throughout.        Assessment & Plan:

## 2011-05-29 NOTE — Patient Instructions (Signed)
CPE in 7 weeks.  It is important that you exercise regularly at least 30 minutes 5 times a week. If you develop chest pain, have severe difficulty breathing, or feel very tired, stop exercising immediately and seek medical attention    A healthy diet is rich in fruit, vegetables and whole grains. Poultry fish, nuts and beans are a healthy choice for protein rather then red meat. A low sodium diet and drinking 64 ounces of water daily is generally recommended. Oils and sweet should be limited. Carbohydrates especially for those who are diabetic or overweight, should be limited to 30-45 gram per meal. It is important to eat on a regular schedule, at least 3 times daily. Snacks should be primarily fruits, vegetables or nuts.   Weight loss goal is 5  Pounds  LABWORK  NEEDS TO BE DONE BETWEEN 3 TO 7 DAYS BEFORE YOUR NEXT SCEDULED  VISIT.  THIS WILL IMPROVE THE QUALITY OF YOUR CARE.  Med is sent in for allergies also phentermine tabs take HALF once daily not the whole as precribed

## 2011-06-02 ENCOUNTER — Encounter: Payer: Self-pay | Admitting: Family Medicine

## 2011-06-02 NOTE — Assessment & Plan Note (Signed)
Uncontrolled , pt to start prescription med

## 2011-06-02 NOTE — Assessment & Plan Note (Signed)
counseled re need for comitmet to dietary change and regular exercise to facilitate weight loss and improve health

## 2011-07-18 ENCOUNTER — Encounter: Payer: Self-pay | Admitting: Family Medicine

## 2011-07-19 ENCOUNTER — Encounter: Payer: Self-pay | Admitting: Family Medicine

## 2011-07-20 LAB — CBC WITH DIFFERENTIAL/PLATELET
Basophils Absolute: 0 10*3/uL (ref 0.0–0.1)
Eosinophils Relative: 2 % (ref 0–5)
HCT: 37 % (ref 36.0–46.0)
Hemoglobin: 11.9 g/dL — ABNORMAL LOW (ref 12.0–15.0)
Lymphocytes Relative: 39 % (ref 12–46)
Lymphs Abs: 2.5 10*3/uL (ref 0.7–4.0)
MCV: 84.3 fL (ref 78.0–100.0)
Monocytes Absolute: 0.4 10*3/uL (ref 0.1–1.0)
Monocytes Relative: 6 % (ref 3–12)
Neutro Abs: 3.5 10*3/uL (ref 1.7–7.7)
Neutrophils Relative %: 52 % (ref 43–77)
Platelets: 329 10*3/uL (ref 150–400)
RBC: 4.39 MIL/uL (ref 3.87–5.11)
RDW: 14.1 % (ref 11.5–15.5)
WBC: 6.6 10*3/uL (ref 4.0–10.5)

## 2011-07-20 LAB — LIPID PANEL
Cholesterol: 136 mg/dL (ref 0–200)
HDL: 40 mg/dL (ref >39)
LDL Cholesterol: 87 mg/dL (ref 0–99)
Total CHOL/HDL Ratio: 3.4 Ratio
VLDL: 9 mg/dL (ref 0–40)

## 2011-07-20 LAB — BASIC METABOLIC PANEL
BUN: 12 mg/dL (ref 6–23)
CO2: 22 mEq/L (ref 19–32)
Chloride: 104 mEq/L (ref 96–112)
Glucose, Bld: 90 mg/dL (ref 70–99)
Potassium: 4 mEq/L (ref 3.5–5.3)
Sodium: 136 mEq/L (ref 135–145)

## 2011-07-20 LAB — TSH: TSH: 0.639 u[IU]/mL (ref 0.350–4.500)

## 2011-07-20 LAB — HEMOGLOBIN A1C: Mean Plasma Glucose: 120 mg/dL — ABNORMAL HIGH (ref <117)

## 2011-07-22 ENCOUNTER — Encounter: Payer: Self-pay | Admitting: Family Medicine

## 2011-07-22 ENCOUNTER — Other Ambulatory Visit (HOSPITAL_COMMUNITY)
Admission: RE | Admit: 2011-07-22 | Discharge: 2011-07-22 | Disposition: A | Discharge status: Home / Self Care | Payer: BC Managed Care – PPO | Source: Ambulatory Visit | Attending: Family Medicine | Admitting: Family Medicine

## 2011-07-22 ENCOUNTER — Ambulatory Visit (INDEPENDENT_AMBULATORY_CARE_PROVIDER_SITE_OTHER): Payer: BC Managed Care – PPO | Admitting: Family Medicine

## 2011-07-22 VITALS — BP 118/80 | HR 84 | Resp 16 | Ht 60.0 in | Wt 253.1 lb

## 2011-07-22 DIAGNOSIS — Z01419 Encounter for gynecological examination (general) (routine) without abnormal findings: Secondary | ICD-10-CM | POA: Insufficient documentation

## 2011-07-22 DIAGNOSIS — Z124 Encounter for screening for malignant neoplasm of cervix: Secondary | ICD-10-CM

## 2011-07-22 DIAGNOSIS — Z0000 Encounter for general adult medical examination without abnormal findings: Secondary | ICD-10-CM | POA: Insufficient documentation

## 2011-07-22 MED ORDER — PHENTERMINE HCL 37.5 MG PO TABS: 37.5000 mg | ORAL_TABLET | Freq: Every day | ORAL | Status: DC

## 2011-07-22 NOTE — Assessment & Plan Note (Signed)
Improved. Pt applauded on succesful weight loss through lifestyle change, and encouraged to continue same. Weight loss goal set for the next several months.  

## 2011-07-22 NOTE — Assessment & Plan Note (Signed)
Commitment to daily exercise discussed and encouraged again.  Referred for individual obesity counseling.  Continue phntermine as before, weight loss goal of at least 4 pounds per months. Flu vac offered, pt refused.

## 2011-07-22 NOTE — Patient Instructions (Signed)
F/u  In 3 months  Continue medications, you will be referred for nutrition counseling  Weight loss goal of between 4 to 5 pounds per month

## 2011-07-22 NOTE — Progress Notes (Signed)
  Subjective:    Patient ID: [name], female    DOB: 16-Nov-1980, 30 y.o.   MRN: [id number]  HPI The PT is here for follow up and annual exam. Unfortunately , recent labs are unavailable for review, but will be followed. Phentermine has assisted with appetiie suppression with no adverse side effects. She plans to commit to more regular exercise     Review of Systems See HPI Denies recent fever or chills. Denies sinus pressure, nasal congestion, ear pain or sore throat. Denies chest congestion, productive cough or wheezing. Denies chest pains, palpitations and leg swelling Denies abdominal pain, nausea, vomiting,diarrhea or constipation.   Denies dysuria, frequency, hesitancy or incontinence. Denies joint pain, swelling and limitation in mobility. Denies headaches, seizures, numbness, or tingling. Denies depression, anxiety or insomnia. Denies skin break down or rash.        Objective:   Physical Exam Pleasant well nourished female, alert and oriented x 3, in no cardio-pulmonary distress. Afebrile. HEENT No facial trauma or asymetry. Sinuses non tender.  EOMI, PERTL, fundoscopic exam  no hemorhage or exudate.  External ears normal, tympanic membranes clear. Oropharynx moist, no exudate, good dentition. Neck: supple, no adenopathy,JVD or thyromegaly.No bruits.  Chest: Clear to ascultation bilaterally.No crackles or wheezes. Non tender to palpation  Breast: No asymetry,no masses. No nipple discharge or inversion. No axillary or supraclavicular adenopathy  Cardiovascular system; Heart sounds normal,  S1 and  S2 ,no S3.  No murmur, or thrill. Apical beat not displaced Peripheral pulses normal.  Abdomen: Soft, non tender, no organomegaly or masses. No bruits. Bowel sounds normal. No guarding, tenderness or rebound.    GU: External genitalia normal. No lesions. Vaginal canal normal.No discharge. Uterus normal size, no adnexal masses, no cervical  motion or adnexal tenderness.  Musculoskeletal exam: Full ROM of spine, hips , shoulders and knees. No deformity ,swelling or crepitus noted. No muscle wasting or atrophy.   Neurologic: Cranial nerves 2 to 12 intact. Power, tone ,sensation and reflexes normal throughout. No disturbance in gait. No tremor.  Skin: Intact, no ulceration, erythema , scaling or rash noted. Pigmentation normal throughout  Psych; Normal mood and affect. Judgement and concentration normal        Assessment & Plan:

## 2011-07-24 LAB — VITAMIN D 1,25 DIHYDROXY
Vitamin D 1, 25 (OH)2 Total: 76 pg/mL — ABNORMAL HIGH (ref 18–72)
Vitamin D2 1, 25 (OH)2: <8 pg/mL
Vitamin D3 1, 25 (OH)2: 76 pg/mL

## 2011-07-29 ENCOUNTER — Telehealth: Payer: Self-pay | Admitting: Family Medicine

## 2011-07-30 NOTE — Progress Notes (Signed)
Patient aware.

## 2011-07-30 NOTE — Progress Notes (Signed)
Patient informed via phone that lab results all in normal range

## 2011-08-30 NOTE — Telephone Encounter (Signed)
Please let patient know.

## 2011-09-04 NOTE — Telephone Encounter (Signed)
Left message all was normal.

## 2011-09-06 ENCOUNTER — Encounter (HOSPITAL_COMMUNITY): Payer: Self-pay | Admitting: Dietician

## 2011-09-06 NOTE — Progress Notes (Unsigned)
Outpatient Initial Nutrition Assessment  Date:09/06/2011   Time: 11:00 AM  Referring Physician: Dr. Lodema Hong Reason for Visit: obesity  Nutrition Assessment:  Ht: 5'0" Wt: 249# IBW:100# %IBW: 249% UBW:249# %UBW: 100% BMI: 48.63 Goal Weight: 211# Weight hx: Pt reports her highest weight was 271#, around 1 year ago when she started seeing Dr. Lodema Hong. She has successfully lost 22# (8.1% loss) in 1 year. Her lowest weight was 211#, when she graduated high school; she desires to return to this weight.   Estimated nutritional needs: 2067-2255 kcals daily, 91-115 grams protein daily, 2.1-2.3 L fluid daily   PMH:  Past Medical History  Diagnosis Date  . Morbid obesity     Medications:  Current outpatient prescriptions:fluticasone (FLONASE) 50 MCG/ACT nasal spray, Place 1 spray into the nose daily., Disp: 16 g, Rfl: 2  Phentermine Labs: CMP     Component Value Date/Time   NA 136 07/24/2011 1321   K 4.0 07/24/2011 1321   CL 104 07/24/2011 1321   CO2 22 07/24/2011 1321   GLUCOSE 90 07/24/2011 1321   BUN 12 07/24/2011 1321   CREATININE 0.97 07/24/2011 1321   CALCIUM 8.8 07/24/2011 1321     CBC    Component Value Date/Time   WBC 6.6 07/24/2011 1321   RBC 4.39 07/24/2011 1321   HGB 11.9* 07/24/2011 1321   HCT 37.0 07/24/2011 1321   PLT 329 07/24/2011 1321   MCV 84.3 07/24/2011 1321   MCH 27.1 07/24/2011 1321   MCHC 32.2 07/24/2011 1321   RDW 14.1 07/24/2011 1321   LYMPHSABS 2.5 07/24/2011 1321   MONOABS 0.4 07/24/2011 1321   EOSABS 0.2 07/24/2011 1321   BASOSABS 0.0 07/24/2011 1321     Lab Results  Component Value Date   HGBA1C 5.8* 07/24/2011   Lab Results  Component Value Date   LDLCALC 87 07/24/2011   CREATININE 0.97 07/24/2011     Nutrition hx/habits: [name] is a pleasant young woman who desires weight loss. She reports that she is not satisfied with her weight loss efforts in the past year, despite losing 22#. She had moderate stress, trying to  balance family life, finishing up an online associate's degree in business administration, and working full time as an Environmental health practitioner. She reports that she has been following a 1500 calorie diet by decreasing portions, eliminating fried chicken, limiting burgers, and eating more grilled food, baked food, fruits, and vegetables. She reports she can't eat corn and sesame seeds due to diverticulitis. She eats out 4 times per week at either pizza place (eats 2 slices of veggie pizza) or [employer] (eats salad). She eats a burger about once a month. She reports she has a [employer] and goes once a week with her husband and 3 children (ages 4, 68, and 4); she walks 30 minutes on the treadmill and plays basketball for 30 minutes.    Diet recall: Breakfast (9-9:30 AM): oatmeal OR 2 hard boiled eggs with bacon, water; Lunch (12:30-2:30 PM): chicken salad with banana or orange; Dinner (6-8:30 PM): salad with grilled chicken and water.  Nutrition Diagnosis: Nutrition related knowledge deficit r/t weight loss, sedentary lifestyle, healthy eating principles AEB BMI: 48.63, Hgb A1c: 5.8.   Nutrition Intervention Nutrition rx: 1550 kcal NAS, no added sugar diet; 3 meals/day; low calorie beverages only; 2.5 hours exercise per week  Education/Counseling Provided: Educated pt on plate method, reading food labels, diet principles for diverticular disease, and importance of increased physical activity along with diet to achieve weight loss  goals. Also discussed importance of keeping a food diary and showed demonstrated functionalities of My Fitness Pal. Provided "Plate Method", 1610 calorie diet, and Diverticulitis Nutrition Therapy handouts.     Understanding, Motivation, Ability to Follow Recommendations: Expect fair to good compliance. Pt desires to lose weight, but encouraged pt to make realistic goals and stressed importance of incorporating exercise into daily schedule. Emphasized 1-2# weight loss per  week and slow, moderate weight loss.   Monitoring and Evaluation Goals: 1) 1-2# weight loss per week; 2) 2.5 hours physical activity weekly  Recommendations: 1) For weight loss: 1567-1755 kcals daily; 2) Keep food diary (ex. My Fitness Pal); 3) Break exercise up into smaller, more frequent intervals  F/U: PRN  [name], RD  09/06/2011  Time: 11:00 AM

## 2011-10-16 ENCOUNTER — Encounter: Payer: Self-pay | Admitting: Family Medicine

## 2011-10-24 ENCOUNTER — Ambulatory Visit: Payer: BC Managed Care – PPO | Admitting: Family Medicine

## 2011-11-21 ENCOUNTER — Ambulatory Visit (INDEPENDENT_AMBULATORY_CARE_PROVIDER_SITE_OTHER): Payer: BC Managed Care – PPO | Admitting: Family Medicine

## 2011-11-21 VITALS — BP 122/70 | HR 87 | Resp 18 | Ht 60.0 in | Wt 246.0 lb

## 2011-11-21 DIAGNOSIS — R7302 Impaired glucose tolerance (oral): Secondary | ICD-10-CM

## 2011-11-21 DIAGNOSIS — Z0189 Encounter for other specified special examinations: Secondary | ICD-10-CM

## 2011-11-21 DIAGNOSIS — R7309 Other abnormal glucose: Secondary | ICD-10-CM

## 2011-11-21 DIAGNOSIS — J309 Allergic rhinitis, unspecified: Secondary | ICD-10-CM

## 2011-11-21 DIAGNOSIS — R5381 Other malaise: Secondary | ICD-10-CM

## 2011-11-21 DIAGNOSIS — R5383 Other fatigue: Secondary | ICD-10-CM

## 2011-11-21 DIAGNOSIS — Z0000 Encounter for general adult medical examination without abnormal findings: Secondary | ICD-10-CM

## 2011-11-21 DIAGNOSIS — R42 Dizziness and giddiness: Secondary | ICD-10-CM | POA: Insufficient documentation

## 2011-11-21 NOTE — Progress Notes (Signed)
  Subjective:    Patient ID: [name], female    DOB: June 25, 1981, 31 y.o.   MRN: [id number]  HPI 2 month h/o wekly near syncope, longest duration , has been on phentermine during this time, no h/o dyspnea, chest pain or palpitation. C/olongstanding fatigue, with excessive daytime sleepiness and a h/o snoring, needs sleep study. Has worked on dietary change with some exercise with resultant weight loss  Review of Systems See HPI Denies recent fever or chills. Denies sinus pressure, nasal congestion, ear pain or sore throat. Denies chest congestion, productive cough or wheezing.  Denies abdominal pain, nausea, vomiting,diarrhea or constipation.   Denies dysuria, frequency, hesitancy or incontinence. Denies joint pain, swelling and limitation in mobility. Denies headaches, seizures, numbness, or tingling. Denies depression, anxiety or insomnia. Denies skin break down or rash.        Objective:   Physical Exam  Patient alert and oriented and in no cardiopulmonary distress.  HEENT: No facial asymmetry, EOMI, no sinus tenderness,  oropharynx pink and moist.  Neck supple, short and thick, no adenopathy.  Chest: Clear to auscultation bilaterally.  CVS: S1, S2 no murmurs, no S3.  ABD: Soft non tender. Bowel sounds normal.  Ext: No edema  MS: Adequate ROM spine, shoulders, hips and knees.  Skin: Intact, no ulcerations or rash noted.  Psych: Good eye contact, normal affect. Memory intact not anxious or depressed appearing.  CNS: CN 2-12 intact, power, tone and sensation normal throughout.       Assessment & Plan:

## 2011-11-21 NOTE — Patient Instructions (Signed)
F/u in 4 month  You are referred to cardiology for further evaluation of recurrent light headedness.  STOP phentermine.  I recommend a sleep study as soon as possible due to some of your symptoms  Congrats on weight loss, keep it up  HBA1C in 4 months, before visit please

## 2011-11-29 ENCOUNTER — Encounter: Payer: Self-pay | Admitting: Cardiology

## 2011-12-01 ENCOUNTER — Encounter: Payer: Self-pay | Admitting: Family Medicine

## 2011-12-01 DIAGNOSIS — R7302 Impaired glucose tolerance (oral): Secondary | ICD-10-CM | POA: Insufficient documentation

## 2011-12-01 NOTE — Assessment & Plan Note (Signed)
Importance of continued weight loss, regular exercise, eating a low carb diet is stressed rept lab in 4 month

## 2011-12-01 NOTE — Assessment & Plan Note (Signed)
Controlled, no change in medication  

## 2011-12-01 NOTE — Assessment & Plan Note (Signed)
Deteriorated, has h/o excessive snoring , and breath holding while asleep, also excessive daytime sleepiness will refer for sleep study

## 2011-12-01 NOTE — Assessment & Plan Note (Signed)
Reports recurrent episodes of light headedness and near syncope, cardiology to further eval

## 2011-12-01 NOTE — Assessment & Plan Note (Signed)
Improved. Pt applauded on succesful weight loss through lifestyle change, and encouraged to continue same. Weight loss goal set for the next several months.  

## 2011-12-04 ENCOUNTER — Ambulatory Visit: Payer: BC Managed Care – PPO | Admitting: Cardiology

## 2012-02-13 ENCOUNTER — Telehealth: Payer: Self-pay | Admitting: Family Medicine

## 2012-02-13 DIAGNOSIS — J309 Allergic rhinitis, unspecified: Secondary | ICD-10-CM

## 2012-02-13 MED ORDER — FLUTICASONE PROPIONATE 50 MCG/ACT NA SUSP: 1.0000 | Freq: Every day | NASAL | Status: DC

## 2012-02-13 NOTE — Telephone Encounter (Signed)
Refilled

## 2012-03-20 ENCOUNTER — Ambulatory Visit: Payer: BC Managed Care – PPO | Admitting: Family Medicine

## 2012-05-16 LAB — HEMOGLOBIN A1C: Hgb A1c MFr Bld: 6 % — ABNORMAL HIGH (ref <5.7)

## 2012-05-20 ENCOUNTER — Encounter: Payer: Self-pay | Admitting: Family Medicine

## 2012-05-20 ENCOUNTER — Ambulatory Visit (INDEPENDENT_AMBULATORY_CARE_PROVIDER_SITE_OTHER): Payer: BC Managed Care – PPO | Admitting: Family Medicine

## 2012-05-20 VITALS — BP 120/80 | HR 75 | Resp 18 | Ht 60.0 in | Wt 263.1 lb

## 2012-05-20 DIAGNOSIS — R5381 Other malaise: Secondary | ICD-10-CM

## 2012-05-20 DIAGNOSIS — R7309 Other abnormal glucose: Secondary | ICD-10-CM

## 2012-05-20 DIAGNOSIS — R7303 Prediabetes: Secondary | ICD-10-CM

## 2012-05-20 DIAGNOSIS — R5383 Other fatigue: Secondary | ICD-10-CM

## 2012-05-20 MED ORDER — PHENTERMINE HCL 37.5 MG PO TABS: 37.5000 mg | ORAL_TABLET | Freq: Every day | ORAL | Status: DC

## 2012-05-20 MED ORDER — METFORMIN HCL 500 MG PO TABS: 500.0000 mg | ORAL_TABLET | Freq: Two times a day (BID) | ORAL | Status: DC

## 2012-05-20 NOTE — Patient Instructions (Addendum)
cPE in November.  You are prediabetic, please attend a class which is free at the hospital to learn how to count carbs.  A healthy diet is rich in fruit, vegetables and whole grains. Poultry fish, nuts and beans are a healthy choice for protein rather then red meat. A low sodium diet and drinking 64 ounces of water daily is generally recommended. Oils and sweet should be limited. Carbohydrates especially for those who are diabetic or overweight, should be limited to 30-45 gram per meal. It is important to eat on a regular schedule, at least 3 times daily. Snacks should be primarily fruits, vegetables or nuts.`  It is important that you exercise regularly at least 45 minutes 7 times a week. If you develop chest pain, have severe difficulty breathing, or feel very tired, stop exercising immediately and seek medical attention    You will start metformin one twice daily to help with blood sugar control, and it may also help with weight loss.  Please commit to eating at home more, you can better control what you eat this way  Fasting cbc, chem 7, lipid, tsh and hBA1C in November  Weight loss goal of 3 to 4 pounds per month  Eat only natural sweets, fresh fruit, drink water, diet drinks or crystal light .

## 2012-05-24 NOTE — Assessment & Plan Note (Signed)
Deteriorated. Patient re-educated about  the importance of commitment to a  minimum of 150 minutes of exercise per week. The importance of healthy food choices with portion control discussed. Encouraged to start a food diary, count calories and to consider  joining a support group. Sample diet sheets offered. Goals set by the patient for the next several months.    

## 2012-05-24 NOTE — Assessment & Plan Note (Signed)
Patient advised to reduce carb and sweets, commit to regular physical activity, take meds as prescribed, test blood as directed, and attempt to lose weight, to improve blood sugar control.   Deterioration in HBA1C. Pt to start metformin

## 2012-05-24 NOTE — Progress Notes (Signed)
  Subjective:    Patient ID: [name], female    DOB: Sep 11, 1981, 31 y.o.   MRN: [id number]  HPI The PT is here for follow up and re-evaluation of chronic medical conditions, medication management and review of any available recent lab and radiology data.  Preventive health is updated, specifically  Cancer screening and Immunization.   Pt has been non compliant with lifestyle changes discussed , has gained weight , and her blood sugar control is worse, states she is now motivated to change this     Review of Systems See HPI Denies recent fever or chills. Denies sinus pressure, nasal congestion, ear pain or sore throat. Denies chest congestion, productive cough or wheezing. Denies chest pains, palpitations and leg swelling Denies abdominal pain, nausea, vomiting,diarrhea or constipation.   Denies dysuria, frequency, hesitancy or incontinence. Denies joint pain, swelling and limitation in mobility. Denies headaches, seizures, numbness, or tingling. Denies depression, anxiety or insomnia. Denies skin break down or rash.        Objective:   Physical Exam   Patient alert and oriented and in no cardiopulmonary distress.  HEENT: No facial asymmetry, EOMI, no sinus tenderness,  oropharynx pink and moist.  Neck supple no adenopathy.  Chest: Clear to auscultation bilaterally.  CVS: S1, S2 no murmurs, no S3.  ABD: Soft non tender. Bowel sounds normal.  Ext: No edema  MS: Adequate ROM spine, shoulders, hips and knees.  Skin: Intact, no ulcerations or rash noted.  Psych: Good eye contact, normal affect. Memory intact not anxious or depressed appearing.  CNS: CN 2-12 intact, power, tone and sensation normal throughout.      Assessment & Plan:

## 2012-09-11 ENCOUNTER — Encounter: Payer: BC Managed Care – PPO | Admitting: Family Medicine

## 2012-10-28 ENCOUNTER — Telehealth: Payer: Self-pay | Admitting: Family Medicine

## 2012-10-28 DIAGNOSIS — R5381 Other malaise: Secondary | ICD-10-CM

## 2012-10-28 DIAGNOSIS — R7303 Prediabetes: Secondary | ICD-10-CM

## 2012-10-28 DIAGNOSIS — Z13220 Encounter for screening for lipoid disorders: Secondary | ICD-10-CM

## 2012-10-28 NOTE — Telephone Encounter (Signed)
Labs reordered.  Cancelled in system.  Orders faxed to [facility] as requested by patient.

## 2012-10-28 NOTE — Telephone Encounter (Signed)
Labs ordered and faxed.

## 2012-10-31 LAB — HEMOGLOBIN A1C: Hgb A1c MFr Bld: 5.7 % — ABNORMAL HIGH (ref <5.7)

## 2012-11-01 LAB — LIPID PANEL
HDL: 39 mg/dL — ABNORMAL LOW (ref >39)
LDL Cholesterol: 86 mg/dL (ref 0–99)
Total CHOL/HDL Ratio: 3.5 Ratio

## 2012-11-01 LAB — BASIC METABOLIC PANEL
CO2: 25 mEq/L (ref 19–32)
Calcium: 8.1 mg/dL — ABNORMAL LOW (ref 8.4–10.5)
Glucose, Bld: 94 mg/dL (ref 70–99)
Potassium: 4.4 mEq/L (ref 3.5–5.3)
Sodium: 137 mEq/L (ref 135–145)

## 2012-11-01 LAB — TSH: TSH: 0.728 u[IU]/mL (ref 0.350–4.500)

## 2012-11-04 ENCOUNTER — Encounter: Payer: BC Managed Care – PPO | Admitting: Family Medicine

## 2012-11-06 ENCOUNTER — Other Ambulatory Visit (HOSPITAL_COMMUNITY)
Admission: RE | Admit: 2012-11-06 | Discharge: 2012-11-06 | Disposition: A | Discharge status: Home / Self Care | Payer: BC Managed Care – PPO | Source: Ambulatory Visit | Attending: Family Medicine | Admitting: Family Medicine

## 2012-11-06 ENCOUNTER — Ambulatory Visit (INDEPENDENT_AMBULATORY_CARE_PROVIDER_SITE_OTHER): Payer: BC Managed Care – PPO | Admitting: Family Medicine

## 2012-11-06 ENCOUNTER — Encounter: Payer: Self-pay | Admitting: Family Medicine

## 2012-11-06 VITALS — BP 116/80 | HR 80 | Resp 18 | Ht 60.0 in | Wt 252.1 lb

## 2012-11-06 DIAGNOSIS — Z0000 Encounter for general adult medical examination without abnormal findings: Secondary | ICD-10-CM

## 2012-11-06 DIAGNOSIS — R7303 Prediabetes: Secondary | ICD-10-CM

## 2012-11-06 DIAGNOSIS — Z1151 Encounter for screening for human papillomavirus (HPV): Secondary | ICD-10-CM | POA: Insufficient documentation

## 2012-11-06 DIAGNOSIS — Z01419 Encounter for gynecological examination (general) (routine) without abnormal findings: Secondary | ICD-10-CM | POA: Insufficient documentation

## 2012-11-06 DIAGNOSIS — R7309 Other abnormal glucose: Secondary | ICD-10-CM

## 2012-11-06 DIAGNOSIS — J309 Allergic rhinitis, unspecified: Secondary | ICD-10-CM

## 2012-11-06 MED ORDER — PHENTERMINE HCL 37.5 MG PO TBDP: 1.0000 | ORAL_TABLET | Freq: Every day | ORAL | Status: DC

## 2012-11-06 MED ORDER — FLUTICASONE PROPIONATE 50 MCG/ACT NA SUSP: 1.0000 | Freq: Every day | NASAL | Status: AC

## 2012-11-06 NOTE — Assessment & Plan Note (Signed)
Uncontrolled, flonase prescribed

## 2012-11-06 NOTE — Assessment & Plan Note (Signed)
Patient educated about the importance of limiting  Carbohydrate intake , the need to commit to daily physical activity for a minimum of 30 minutes , and to commit weight loss. The fact that changes in all these areas will reduce or eliminate all together the development of diabetes is stressed.    

## 2012-11-06 NOTE — Assessment & Plan Note (Signed)
Improved. Pt applauded on succesful weight loss through lifestyle change, and encouraged to continue same. Weight loss goal set for the next several months.  

## 2012-11-06 NOTE — Assessment & Plan Note (Signed)
Annual exam completed as documented. Pt congratulated on weight loss, heallthy eating discussed Pt to start daily physical activity to help with weight loss and improve her health

## 2012-11-06 NOTE — Patient Instructions (Addendum)
F/u in 4 month  Labs have improved. Please commit to daily exercise and continue your new healthy way of eating.  Phenetemine HALF daily, though script states one daily.  Flonase spray sent in for allergies.OK to use sudafed one daily for 3 to 4 days for excessive nasal drainage Please get the flu vaccine at your pharmacy as soon as possible  HBa1C in 4 month

## 2012-11-06 NOTE — Progress Notes (Signed)
  Subjective:    Patient ID: [name], female    DOB: [birth year], 32 y.o.   MRN: [id number]  HPI The PT is here for annual exam  and re-evaluation of chronic medical conditions, medication management and review of any available recent lab and radiology data.  Preventive health is updated, specifically  Cancer screening and Immunization.    The PT denies any adverse reactions to current medications since the last visit.  There are no new concerns.Working more consistently on weight loss through change in diet in the past 2 weeks, also states the phentermine does help with appetite suppression, wants to continue  There are no specific complaints       Review of Systems See HPI Denies recent fever or chills. Denies sinus pressure, nasal congestion, ear pain or sore throat. Denies chest congestion, productive cough or wheezing. Denies chest pains, palpitations and leg swelling Denies abdominal pain, nausea, vomiting,diarrhea or constipation.   Denies dysuria, frequency, hesitancy or incontinence. Denies joint pain, swelling and limitation in mobility. Denies headaches, seizures, numbness, or tingling. Denies depression, anxiety or insomnia. Denies skin break down or rash.        Objective:   Physical Exam Pleasant well nourished female, alert and oriented x 3, in no cardio-pulmonary distress. Afebrile. HEENT No facial trauma or asymetry. Sinuses non tender.  EOMI, PERTL, fundoscopic exam is normal, no hemorhage or exudate.  External ears normal, tympanic membranes clear. Oropharynx moist, no exudate, good dentition. Neck: supple, no adenopathy,JVD or thyromegaly.No bruits.  Chest: Clear to ascultation bilaterally.No crackles or wheezes. Non tender to palpation  Breast: No asymetry,no masses. No nipple discharge or inversion. No axillary or supraclavicular adenopathy  Cardiovascular system; Heart sounds normal,  S1 and  S2 ,no S3.  No murmur, or  thrill. Apical beat not displaced Peripheral pulses normal.  Abdomen: Soft, non tender, no organomegaly or masses. No bruits. Bowel sounds normal. No guarding, tenderness or rebound.    GU: External genitalia normal. No lesions. Vaginal canal normal.No discharge. Uterus normal size, no adnexal masses, no cervical motion or adnexal tenderness.  Musculoskeletal exam: Full ROM of spine, hips , shoulders and knees. No deformity ,swelling or crepitus noted. No muscle wasting or atrophy.   Neurologic: Cranial nerves 2 to 12 intact. Power, tone ,sensation and reflexes normal throughout. No disturbance in gait. No tremor.  Skin: Intact, no ulceration, erythema , scaling or rash noted. Pigmentation normal throughout  Psych; Normal mood and affect. Judgement and concentration normal         Assessment & Plan:

## 2012-11-11 ENCOUNTER — Other Ambulatory Visit: Payer: Self-pay | Admitting: Family Medicine

## 2012-11-12 ENCOUNTER — Other Ambulatory Visit: Payer: Self-pay

## 2012-11-12 MED ORDER — METRONIDAZOLE 500 MG PO TABS: ORAL_TABLET | ORAL | Status: DC

## 2012-12-21 ENCOUNTER — Telehealth: Payer: Self-pay | Admitting: Family Medicine

## 2012-12-21 NOTE — Telephone Encounter (Signed)
Called and left message for patient to call back with detailed message.

## 2012-12-21 NOTE — Telephone Encounter (Signed)
Spoke with patient and she is able to get pharmacy to fill phentermine.

## 2013-03-09 ENCOUNTER — Ambulatory Visit: Payer: BC Managed Care – PPO | Admitting: Family Medicine

## 2013-03-25 ENCOUNTER — Ambulatory Visit: Payer: BC Managed Care – PPO | Admitting: Family Medicine

## 2013-07-13 ENCOUNTER — Telehealth: Payer: Self-pay | Admitting: Family Medicine

## 2013-07-13 DIAGNOSIS — R7303 Prediabetes: Secondary | ICD-10-CM

## 2013-07-13 NOTE — Telephone Encounter (Signed)
No labs ordered. What does she need?  

## 2013-07-13 NOTE — Telephone Encounter (Signed)
pls order CBC and HBA1C this is non fasting let her know

## 2013-07-13 NOTE — Telephone Encounter (Signed)
Labs ordered and faxed over to [facility].

## 2013-07-17 ENCOUNTER — Other Ambulatory Visit: Payer: Self-pay | Admitting: Family Medicine

## 2013-07-17 LAB — CBC
HCT: 34.4 % — ABNORMAL LOW (ref 36.0–46.0)
Hemoglobin: 11.3 g/dL — ABNORMAL LOW (ref 12.0–15.0)
MCH: 26.7 pg (ref 26.0–34.0)
MCHC: 32.8 g/dL (ref 30.0–36.0)
MCV: 81.3 fL (ref 78.0–100.0)
Platelets: 388 K/uL (ref 150–400)
RBC: 4.23 MIL/uL (ref 3.87–5.11)
RDW: 14.2 % (ref 11.5–15.5)
WBC: 6 K/uL (ref 4.0–10.5)

## 2013-07-17 LAB — HEMOGLOBIN A1C
Hgb A1c MFr Bld: 6 % — ABNORMAL HIGH (ref <5.7)
Mean Plasma Glucose: 126 mg/dL — ABNORMAL HIGH (ref <117)

## 2013-07-23 ENCOUNTER — Encounter: Payer: Self-pay | Admitting: Family Medicine

## 2013-07-23 ENCOUNTER — Ambulatory Visit (INDEPENDENT_AMBULATORY_CARE_PROVIDER_SITE_OTHER): Payer: BC Managed Care – PPO | Admitting: Family Medicine

## 2013-07-23 ENCOUNTER — Encounter (INDEPENDENT_AMBULATORY_CARE_PROVIDER_SITE_OTHER): Payer: Self-pay

## 2013-07-23 VITALS — BP 118/78 | HR 100 | Resp 16 | Ht 60.0 in | Wt 271.0 lb

## 2013-07-23 DIAGNOSIS — M25569 Pain in unspecified knee: Secondary | ICD-10-CM

## 2013-07-23 DIAGNOSIS — N920 Excessive and frequent menstruation with regular cycle: Secondary | ICD-10-CM

## 2013-07-23 DIAGNOSIS — M25561 Pain in right knee: Secondary | ICD-10-CM

## 2013-07-23 DIAGNOSIS — R5383 Other fatigue: Secondary | ICD-10-CM

## 2013-07-23 DIAGNOSIS — J309 Allergic rhinitis, unspecified: Secondary | ICD-10-CM

## 2013-07-23 DIAGNOSIS — R7303 Prediabetes: Secondary | ICD-10-CM

## 2013-07-23 DIAGNOSIS — R5381 Other malaise: Secondary | ICD-10-CM

## 2013-07-23 DIAGNOSIS — R7309 Other abnormal glucose: Secondary | ICD-10-CM

## 2013-07-23 MED ORDER — PHENTERMINE HCL 37.5 MG PO TBDP: 1.0000 | ORAL_TABLET | Freq: Every day | ORAL | Status: DC

## 2013-07-23 MED ORDER — METFORMIN HCL ER (MOD) 500 MG PO TB24: 500.0000 mg | ORAL_TABLET | Freq: Every day | ORAL | Status: DC

## 2013-07-23 NOTE — Assessment & Plan Note (Signed)
Floods and clots, she is anemic, imaging studies needed

## 2013-07-23 NOTE — Assessment & Plan Note (Signed)
4 month h/o right knee pain and swelling with instability

## 2013-07-23 NOTE — Progress Notes (Signed)
  Subjective:    Patient ID: [name], female    DOB: 1980-12-09, 32 y.o.   MRN: [id number]  HPI  The PT is here for follow up and re-evaluation of chronic medical conditions, medication management and review of any available recent lab and radiology data.  Preventive health is updated, specifically  Cancer screening and Immunization.   . The PT denies any adverse reactions to current medications since the last visit. Discontinued metformin, thought it made her sugar low, she was on one twice daily. Did not stick with use of phentermine or regular f/u , subsequent 19 pound weight gain 4  Month h/o pain, swelling and limitation of movement of left knee with instability, and near falls. No recent direct or indirect trauma to knee , first episode      Review of Systems See HPI Denies recent fever or chills. Denies sinus pressure, nasal congestion, ear pain or sore throat. Denies chest congestion, productive cough or wheezing. Denies chest pains, palpitations and leg swelling Denies abdominal pain, nausea, vomiting,diarrhea or constipation.   Denies dysuria, frequency, hesitancy or incontinence.c/o excesively heavy menses with clots and flooding from 3 to 7 days duration. Wants no more children  Denies headaches, seizures, numbness, or tingling. Denies  anxiety or insomnia.c/o mild depression and frustration with weight , ready to get back on track Denies skin break down or rash.        Objective:   Physical Exam        Assessment & Plan:

## 2013-07-23 NOTE — Assessment & Plan Note (Signed)
Deteriorated Resume metformin Patient educated about the importance of limiting  Carbohydrate intake , the need to commit to daily physical activity for a minimum of 30 minutes , and to commit weight loss. The fact that changes in all these areas will reduce or eliminate all together the development of diabetes is stressed.

## 2013-07-23 NOTE — Assessment & Plan Note (Signed)
Controlled, no change in medication  

## 2013-07-23 NOTE — Assessment & Plan Note (Signed)
Deteriorated. Patient re-educated about  the importance of commitment to a  minimum of 150 minutes of exercise per week. The importance of healthy food choices with portion control discussed. Encouraged to start a food diary, count calories and to consider  joining a support group. Sample diet sheets offered. Goals set by the patient for the next several months.    

## 2013-07-23 NOTE — Patient Instructions (Addendum)
CPE in  January 20 or after, call if you need me before  Flu vaccine today.  New for blood sugar and to help with weight loss is metformin one daily at breakfast or any meal preferred  New is phentermine HALF daily for help with appetitie control as you change eating to lose weight  Start iron 325 mg one twice daily for anemia due to heavy cycles  Fasting labs Jan 16 or after CBc, lipid, chem 7, hBA1C , TSH, before visit  You are referred to Dr Romeo Apple re right knee pain and swelling      Information on  Endometrial Ablation Endometrial ablation removes the lining of the uterus (endometrium). It is usually a same day, outpatient treatment. Ablation helps avoid major surgery (such as a hysterectomy). A hysterectomy is removal of the cervix and uterus. Endometrial ablation has less risk and complications, has a shorter recovery period and is less expensive. After endometrial ablation, most women will have little or no menstrual bleeding. You may not keep your fertility. Pregnancy is no longer likely after this procedure but if you are pre-menopausal, you still need to use a reliable method of birth control following the procedure because pregnancy can occur. REASONS TO HAVE THE PROCEDURE MAY INCLUDE:  Heavy periods.  Bleeding that is causing anemia.  Anovulatory bleeding, very irregular, bleeding.  Bleeding submucous fibroids (on the lining inside the uterus) if they are smaller than 3 centimeters. REASONS NOT TO HAVE THE PROCEDURE MAY INCLUDE:  You wish to have more children.  You have a pre-cancerous or cancerous problem. The cause of any abnormal bleeding must be diagnosed before having the procedure.  You have pain coming from the uterus.  You have a submucus fibroid larger than 3 centimeters.  You recently had a baby.  You recently had an infection in the uterus.  You have a severe retro-flexed, tipped uterus and cannot insert the instrument to do the ablation.  You  had a Cesarean section or deep major surgery on the uterus.  The inner cavity of the uterus is too large for the endometrial ablation instrument. RISKS AND COMPLICATIONS   Perforation of the uterus.  Bleeding.  Infection of the uterus, bladder or vagina.  Injury to surrounding organs.  Cutting the cervix.  An air bubble to the lung (air embolus).  Pregnancy following the procedure.  Failure of the procedure to help the problem requiring hysterectomy.  Decreased ability to diagnose cancer in the lining of the uterus. BEFORE THE PROCEDURE  The lining of the uterus must be tested to make sure there is no pre-cancerous or cancer cells present.  Medications may be given to make the lining of the uterus thinner.  Ultrasound may be used to evaluate the size and look for abnormalities of the uterus.  Future pregnancy is not desired. PROCEDURE  There are different ways to destroy the lining of the uterus.   Resectoscope - radio frequency-alternating electric current is the most common one used.  Cryotherapy - freezing the lining of the uterus.  Heated Free Liquid - heated salt (saline) solution inserted into the uterus.  Microwave - uses high energy microwaves in the uterus.  Thermal Balloon - a catheter with a balloon tip is inserted into the uterus and filled with heated fluid. Your caregiver will talk with you about the method used in this clinic. They will also instruct you on the pros and cons of the procedure. Endometrial ablation is performed along with a procedure called  operative hysteroscopy. A narrow viewing tube is inserted through the birth canal (vagina) and through the cervix into the uterus. A tiny camera attached to the viewing tube (hysteroscope) allows the uterine cavity to be shown on a TV monitor during surgery. Your uterus is filled with a harmless liquid to make the procedure easier. The lining of the uterus is then removed. The lining can also be removed with  a resectoscope which allows your surgeon to cut away the lining of the uterus under direct vision. Usually, you will be able to go home within an hour after the procedure. HOME CARE INSTRUCTIONS   Do not drive for 24 hours.  No tampons, douching or intercourse for 2 weeks or until your caregiver approves.  Rest at home for 24 to 48 hours. You may then resume normal activities unless told differently by your caregiver.  Take your temperature two times a day for 4 days, and record it.  Take any medications your caregiver has ordered, as directed.  Use some form of contraception if you are pre-menopausal and do not want to get pregnant. Bleeding after the procedure is normal. It varies from light spotting and mildly watery to bloody discharge for 4 to 6 weeks. You may also have mild cramping. Only take over-the-counter or prescription medicines for pain, discomfort, or fever as directed by your caregiver. Do not use aspirin, as this may aggravate bleeding. Frequent urination during the first 24 hours is normal. You will not know how effective your surgery is until at least 3 months after the surgery. SEEK IMMEDIATE MEDICAL CARE IF:   Bleeding is heavier than a normal menstrual cycle.  An oral temperature above 102 F (38.9 C) develops.  You have increasing cramps or pains not relieved with medication or develop belly (abdominal) pain which does not seem to be related to the same area of earlier cramping and pain.  You are light headed, weak or have fainting episodes.  You develop pain in the shoulder strap areas.  You have chest or leg pain.  You have abnormal vaginal discharge.  You have painful urination. Document Released: 08/09/2004 Document Revised: 12/23/2011 Document Reviewed: 11/07/2007 [facility] Patient Information 2014 [place], [place].

## 2013-07-24 ENCOUNTER — Telehealth: Payer: Self-pay | Admitting: Family Medicine

## 2013-07-24 NOTE — Telephone Encounter (Signed)
Pls refer pt to Dr Magnus Ivan ortho in [place] re knee pain instead of Dr Romeo Apple, she sent a msg after the visit, s[pecifically requesting that MD, thanks

## 2013-07-27 ENCOUNTER — Ambulatory Visit (HOSPITAL_COMMUNITY): Payer: BC Managed Care – PPO

## 2013-07-30 ENCOUNTER — Ambulatory Visit (HOSPITAL_COMMUNITY)
Admission: RE | Admit: 2013-07-30 | Discharge: 2013-07-30 | Disposition: A | Discharge status: Home / Self Care | Payer: BC Managed Care – PPO | Source: Ambulatory Visit | Attending: Family Medicine | Admitting: Family Medicine

## 2013-07-30 ENCOUNTER — Ambulatory Visit (HOSPITAL_COMMUNITY): Payer: BC Managed Care – PPO

## 2013-07-30 DIAGNOSIS — N920 Excessive and frequent menstruation with regular cycle: Secondary | ICD-10-CM | POA: Insufficient documentation

## 2013-07-30 DIAGNOSIS — R939 Diagnostic imaging inconclusive due to excess body fat of patient: Secondary | ICD-10-CM | POA: Insufficient documentation

## 2013-07-30 DIAGNOSIS — D649 Anemia, unspecified: Secondary | ICD-10-CM | POA: Insufficient documentation

## 2013-08-17 ENCOUNTER — Other Ambulatory Visit (HOSPITAL_COMMUNITY): Payer: BC Managed Care – PPO

## 2013-10-30 LAB — LIPID PANEL
Cholesterol: 161 mg/dL (ref 0–200)
HDL: 46 mg/dL (ref >39)
LDL Cholesterol: 102 mg/dL — ABNORMAL HIGH (ref 0–99)
Total CHOL/HDL Ratio: 3.5 Ratio
Triglycerides: 65 mg/dL (ref <150)
VLDL: 13 mg/dL (ref 0–40)

## 2013-10-30 LAB — CBC WITH DIFFERENTIAL/PLATELET
Basophils Absolute: 0 10*3/uL (ref 0.0–0.1)
Basophils Relative: 1 % (ref 0–1)
Eosinophils Absolute: 0.2 10*3/uL (ref 0.0–0.7)
Eosinophils Relative: 3 % (ref 0–5)
HCT: 36.7 % (ref 36.0–46.0)
Hemoglobin: 12 g/dL (ref 12.0–15.0)
Lymphocytes Relative: 38 % (ref 12–46)
Lymphs Abs: 2.2 10*3/uL (ref 0.7–4.0)
MCH: 26.6 pg (ref 26.0–34.0)
MCHC: 32.7 g/dL (ref 30.0–36.0)
MCV: 81.4 fL (ref 78.0–100.0)
Monocytes Absolute: 0.4 10*3/uL (ref 0.1–1.0)
Monocytes Relative: 6 % (ref 3–12)
Neutro Abs: 3 10*3/uL (ref 1.7–7.7)
Neutrophils Relative %: 52 % (ref 43–77)
Platelets: 362 10*3/uL (ref 150–400)
RBC: 4.51 MIL/uL (ref 3.87–5.11)
RDW: 14.4 % (ref 11.5–15.5)
WBC: 5.8 10*3/uL (ref 4.0–10.5)

## 2013-10-30 LAB — BASIC METABOLIC PANEL
BUN: 11 mg/dL (ref 6–23)
CO2: 25 mEq/L (ref 19–32)
Calcium: 8.8 mg/dL (ref 8.4–10.5)
Chloride: 104 mEq/L (ref 96–112)
Creat: 0.96 mg/dL (ref 0.50–1.10)
Glucose, Bld: 85 mg/dL (ref 70–99)
Potassium: 4.1 mEq/L (ref 3.5–5.3)
Sodium: 138 mEq/L (ref 135–145)

## 2013-10-30 LAB — TSH: TSH: 0.792 u[IU]/mL (ref 0.350–4.500)

## 2013-10-30 LAB — HEMOGLOBIN A1C
Hgb A1c MFr Bld: 6 % — ABNORMAL HIGH (ref <5.7)
Mean Plasma Glucose: 126 mg/dL — ABNORMAL HIGH (ref <117)

## 2013-11-04 ENCOUNTER — Encounter: Payer: Self-pay | Admitting: Family Medicine

## 2013-11-04 ENCOUNTER — Other Ambulatory Visit (HOSPITAL_COMMUNITY)
Admission: RE | Admit: 2013-11-04 | Discharge: 2013-11-04 | Disposition: A | Discharge status: Home / Self Care | Payer: BC Managed Care – PPO | Source: Ambulatory Visit | Attending: Family Medicine | Admitting: Family Medicine

## 2013-11-04 ENCOUNTER — Ambulatory Visit (INDEPENDENT_AMBULATORY_CARE_PROVIDER_SITE_OTHER): Payer: BC Managed Care – PPO | Admitting: Family Medicine

## 2013-11-04 VITALS — BP 114/78 | HR 88 | Resp 16 | Ht 60.0 in | Wt 267.0 lb

## 2013-11-04 DIAGNOSIS — Z0000 Encounter for general adult medical examination without abnormal findings: Secondary | ICD-10-CM

## 2013-11-04 DIAGNOSIS — F329 Major depressive disorder, single episode, unspecified: Secondary | ICD-10-CM

## 2013-11-04 DIAGNOSIS — F339 Major depressive disorder, recurrent, unspecified: Secondary | ICD-10-CM | POA: Insufficient documentation

## 2013-11-04 DIAGNOSIS — F32A Depression, unspecified: Secondary | ICD-10-CM

## 2013-11-04 DIAGNOSIS — R7309 Other abnormal glucose: Secondary | ICD-10-CM

## 2013-11-04 DIAGNOSIS — F3289 Other specified depressive episodes: Secondary | ICD-10-CM

## 2013-11-04 DIAGNOSIS — R7303 Prediabetes: Secondary | ICD-10-CM

## 2013-11-04 DIAGNOSIS — Z01419 Encounter for gynecological examination (general) (routine) without abnormal findings: Secondary | ICD-10-CM | POA: Insufficient documentation

## 2013-11-04 DIAGNOSIS — G4700 Insomnia, unspecified: Secondary | ICD-10-CM

## 2013-11-04 DIAGNOSIS — Z124 Encounter for screening for malignant neoplasm of cervix: Secondary | ICD-10-CM

## 2013-11-04 MED ORDER — METFORMIN HCL ER (MOD) 1000 MG PO TB24: 1000.0000 mg | ORAL_TABLET | Freq: Every day | ORAL | Status: DC

## 2013-11-04 MED ORDER — VENLAFAXINE HCL ER 37.5 MG PO CP24: 37.5000 mg | ORAL_CAPSULE | Freq: Every day | ORAL | Status: DC

## 2013-11-04 MED ORDER — TEMAZEPAM 7.5 MG PO CAPS: 7.5000 mg | ORAL_CAPSULE | Freq: Every day | ORAL | Status: DC

## 2013-11-04 NOTE — Patient Instructions (Addendum)
F/u in Mid to end March, call if you need me before   New medications for depression and sleep, we will hold on phentermine now and focus on mental health  Please commit to exercise at least 5 days per week, this is helpful to every aspect of your health  Labs show blood sugar average is unchanged , still 6.0 which is too high so increase metformin two 500mf tabs once daily till done, new script is for 1000mg  one daily

## 2013-11-07 DIAGNOSIS — G4700 Insomnia, unspecified: Secondary | ICD-10-CM | POA: Insufficient documentation

## 2013-11-07 NOTE — Assessment & Plan Note (Signed)
Annual exam as documented. Counseling done  re healthy lifestyle involving commitment to 150 minutes exercise per week, heart healthy diet, and attaining healthy weight.The importance of adequate sleep also discussed. Regular seat belt use and safe storage  of firearms if patient has them, is also discussed. Changes in health habits are decided on by the patient with goals and time frames  set for achieving them. Immunization and cancer screening needs are specifically addressed at this visit.  

## 2013-11-07 NOTE — Progress Notes (Signed)
   Subjective:    Patient ID: [name], female    DOB: May 18, 1981, 33 y.o.   MRN: [id number]  HPI  Pt in for annual exam , and review of any recent lab data. She c/o increased depression and insomnia, though not suicidal or homicidal, she has a significantly high PHQ 9 score at visit, and hence needs to start medication and shift the focus from phentermine for appetite suppression to treatment of depression. Also has insomnia , sleeps about 4 hrs per night, anxious , worried about finances and health of family members, needs help with this also  Review of Systems See HPI Denies recent fever or chills. Denies sinus pressure, nasal congestion, ear pain or sore throat. Denies chest congestion, productive cough or wheezing. Denies chest pains, palpitations and leg swelling Denies abdominal pain, nausea, vomiting,diarrhea or constipation.   Denies dysuria, frequency, hesitancy or incontinence. Denies joint pain, swelling and limitation in mobility. Denies headaches, seizures, numbness, or tingling. Denies skin break down or rash.        Objective:   Physical Exam  Pleasant well nourished female, alert and oriented x 3, in no cardio-pulmonary distress. Afebrile. HEENT No facial trauma or asymetry. Sinuses non tender.  EOMI, PERTL, fundoscopic exam no hemorhage or exudate.  External ears normal, tympanic membranes clear. Oropharynx moist, no exudate, fair  dentition. Neck: supple, no adenopathy,JVD or thyromegaly.No bruits.  Chest: Clear to ascultation bilaterally.No crackles or wheezes. Non tender to palpation  Breast: No asymetry,no masses. No nipple discharge or inversion. No axillary or supraclavicular adenopathy  Cardiovascular system; Heart sounds normal,  S1 and  S2 ,no S3.  No murmur, or thrill. Apical beat not displaced Peripheral pulses normal.  Abdomen: Soft, non tender, no organomegaly or masses. No bruits. Bowel sounds normal. No guarding,  tenderness or rebound.   GU: External genitalia normal. No lesions. Vaginal canal Physiologic  discharge. Uterus normal size, no adnexal masses, no cervical motion or adnexal tenderness.  Musculoskeletal exam: Full ROM of spine, hips , shoulders and knees. No deformity ,swelling or crepitus noted. No muscle wasting or atrophy.   Neurologic: Cranial nerves 2 to 12 intact. Power, tone ,sensation and reflexes normal throughout. No disturbance in gait. No tremor.  Skin: Intact, no ulceration, erythema , scaling or rash noted. Pigmentation normal throughout  Psych; Depressed  mood and affect. Judgement and concentration normal       Assessment & Plan:

## 2013-11-07 NOTE — Assessment & Plan Note (Signed)
Sleep hygiene reviewed. Pt to start restoril due to very poor sleep, on av 3 hrs per night

## 2013-11-07 NOTE — Assessment & Plan Note (Signed)
Pt not suicidal or homicidal  She is started  Medication and will return for re evaluation.No interest in therapy at this time, reports good family support, and a lot of her anxiety is due to ill health n family members and economic stress Regular exercise is also encouraged to help with managing her depression

## 2014-01-10 ENCOUNTER — Ambulatory Visit (INDEPENDENT_AMBULATORY_CARE_PROVIDER_SITE_OTHER): Payer: BC Managed Care – PPO | Admitting: Family Medicine

## 2014-01-10 ENCOUNTER — Encounter: Payer: Self-pay | Admitting: Family Medicine

## 2014-01-10 VITALS — BP 116/78 | HR 100 | Resp 20 | Ht 60.0 in | Wt 271.0 lb

## 2014-01-10 DIAGNOSIS — R7303 Prediabetes: Secondary | ICD-10-CM

## 2014-01-10 DIAGNOSIS — F3289 Other specified depressive episodes: Secondary | ICD-10-CM

## 2014-01-10 DIAGNOSIS — F329 Major depressive disorder, single episode, unspecified: Secondary | ICD-10-CM

## 2014-01-10 DIAGNOSIS — Z13220 Encounter for screening for lipoid disorders: Secondary | ICD-10-CM

## 2014-01-10 DIAGNOSIS — F32A Depression, unspecified: Secondary | ICD-10-CM

## 2014-01-10 DIAGNOSIS — R7309 Other abnormal glucose: Secondary | ICD-10-CM

## 2014-01-10 MED ORDER — VENLAFAXINE HCL ER 75 MG PO CP24: 75.0000 mg | ORAL_CAPSULE | Freq: Every day | ORAL | Status: DC

## 2014-01-10 NOTE — Patient Instructions (Signed)
F/u in 3.5 month, call iof you need me before.  Please accept my condolence on your recent loss.  Stop metformin as unable to tolerate.  Use restoril sparingly if you have difficulty sleeping, espescialy at this time  DOSE INCREASE in effexor to 75mg  daily (OK tot take two 37.5mg  capsules once daily)  It is important that you exercise regularly at least 30 minutes 5 times a week. If you develop chest pain, have severe difficulty breathing, or feel very tired, stop exercising immediately and seek medical attention   HBA1c , fasting lipid in 3.5 month, 2 to 5 days before next visit.  Please work on weight loss , and lowering blood sugar and cholesterol to reduce your risk of heart disease, this is very  important

## 2014-01-10 NOTE — Assessment & Plan Note (Signed)
Deteriorated. Patient re-educated about  the importance of commitment to a  minimum of 150 minutes of exercise per week. The importance of healthy food choices with portion control discussed. Encouraged to start a food diary, count calories and to consider  joining a support group. Sample diet sheets offered. Goals set by the patient for the next several months.    

## 2014-01-10 NOTE — Progress Notes (Signed)
   Subjective:    Patient ID: [name], female    DOB: [birth year], 33 y.o.   MRN: [id number]  HPI  Pt in for review of her chronic health isiues. Has nmade no progress in past 3 months, due to increased stress, grandmother lost her battle to liver cancer last week after being diagnosed about 6 months ago.She has not been exercising, was unable to tolerate metformin, made her feel as though blood sugar was low, and also has not used restoril states sleep improved with effexor. Stll depressed, no improvement in score , some of this is due to acute grief undoubtedly  Review of Systems See HPI Denies recent fever or chills. Denies sinus pressure, nasal congestion, ear pain or sore throat. Denies chest congestion, productive cough or wheezing. Denies chest pains, palpitations and leg swelling Denies abdominal pain, nausea, vomiting,diarrhea or constipation.   Denies dysuria, frequency, hesitancy or incontinence. Denies joint pain, swelling and limitation in mobility. Denies headaches, seizures, numbness, or tingling.  Denies skin break down or rash.        Objective:   Physical Exam  BP 116/78  Pulse 100  Resp 20  Ht 5' (1.524 m)  Wt 271 lb (122.925 kg)  BMI 52.93 kg/m2  SpO2 99%  LMP 12/14/2013 Patient alert and oriented and in no cardiopulmonary distress.  HEENT: No facial asymmetry, EOMI, no sinus tenderness,  oropharynx pink and moist.  Neck supple no adenopathy.  Chest: Clear to auscultation bilaterally.  CVS: S1, S2 no murmurs, no S3.  ABD: Soft non tender. Bowel sounds normal.  Ext: No edema  Psych: good eye contact, flat affect, not anxious, but depressed appearing. Not suicidal or homicidal, no hallucinations  CNS: CN 2-12 intact, power, tone and sensation normal throughout.       Assessment & Plan:  Depression Essentially unchanged on lowest dose effexor will increase dose, pt lost her grandmother 1 week ago to liver cancer , so this  contributes to her mental health  MORBID OBESITY Deteriorated. Patient re-educated about  the importance of commitment to a  minimum of 150 minutes of exercise per week. The importance of healthy food choices with portion control discussed. Encouraged to start a food diary, count calories and to consider  joining a support group. Sample diet sheets offered. Goals set by the patient for the next several months.     Prediabetes Patient educated about the importance of limiting  Carbohydrate intake , the need to commit to daily physical activity for a minimum of 30 minutes , and to commit weight loss. The fact that changes in all these areas will reduce or eliminate all together the development of diabetes is stressed.   Updated lab needed at/ before next visit.

## 2014-01-10 NOTE — Assessment & Plan Note (Signed)
Patient educated about the importance of limiting  Carbohydrate intake , the need to commit to daily physical activity for a minimum of 30 minutes , and to commit weight loss. The fact that changes in all these areas will reduce or eliminate all together the development of diabetes is stressed.   Updated lab needed at/ before next visit.  

## 2014-01-10 NOTE — Assessment & Plan Note (Signed)
Essentially unchanged on lowest dose effexor will increase dose, pt lost her grandmother 1 week ago to liver cancer , so this contributes to her mental health

## 2014-03-03 ENCOUNTER — Ambulatory Visit (INDEPENDENT_AMBULATORY_CARE_PROVIDER_SITE_OTHER): Payer: BC Managed Care – PPO | Admitting: Family Medicine

## 2014-03-03 ENCOUNTER — Encounter: Payer: Self-pay | Admitting: Family Medicine

## 2014-03-03 VITALS — BP 118/80 | HR 97 | Resp 16 | Wt 274.4 lb

## 2014-03-03 DIAGNOSIS — F32A Depression, unspecified: Secondary | ICD-10-CM

## 2014-03-03 DIAGNOSIS — E785 Hyperlipidemia, unspecified: Secondary | ICD-10-CM

## 2014-03-03 DIAGNOSIS — E8881 Metabolic syndrome: Secondary | ICD-10-CM

## 2014-03-03 DIAGNOSIS — F329 Major depressive disorder, single episode, unspecified: Secondary | ICD-10-CM

## 2014-03-03 DIAGNOSIS — F411 Generalized anxiety disorder: Secondary | ICD-10-CM | POA: Insufficient documentation

## 2014-03-03 DIAGNOSIS — F3289 Other specified depressive episodes: Secondary | ICD-10-CM

## 2014-03-03 DIAGNOSIS — R7303 Prediabetes: Secondary | ICD-10-CM

## 2014-03-03 DIAGNOSIS — F410 Panic disorder [episodic paroxysmal anxiety] without agoraphobia: Secondary | ICD-10-CM | POA: Insufficient documentation

## 2014-03-03 DIAGNOSIS — R7309 Other abnormal glucose: Secondary | ICD-10-CM

## 2014-03-03 MED ORDER — BUSPIRONE HCL 5 MG PO TABS: 5.0000 mg | ORAL_TABLET | Freq: Three times a day (TID) | ORAL | Status: DC

## 2014-03-03 MED ORDER — ALPRAZOLAM 0.5 MG PO TABS: ORAL_TABLET | ORAL | Status: DC

## 2014-03-03 NOTE — Progress Notes (Signed)
   Subjective:    Patient ID: [name], female    DOB: 10-04-1981, 33 y.o.   MRN: [id number]  HPI Pt reports good response to effexor for GAd and depression, however in April she had severe 30 min panic attck , felt as though walls were closing in on her and she was so drained had to leave work for 2 days This past Monday on the way to work , again had an episode, 30 mins not as severe, did go to work late, wants help with the panic attacks as well as better control of her GAD Repeat depression screen shows marked improvement with that Unfortunately, she ha had significant weight gain but has been under excessive stress  Review of Systems See HPI Denies recent fever or chills. Denies sinus pressure, nasal congestion, ear pain or sore throat. Denies chest congestion, productive cough or wheezing. Denies chest pains, palpitations and leg swelling Denies abdominal pain, nausea, vomiting,diarrhea or constipation.   Denies dysuria, frequency, hesitancy or incontinence. Denies joint pain, swelling and limitation in mobility. Denies headaches, seizures, numbness, or tingling.  Denies skin break down or rash.        Objective:   Physical Exam BP 118/80  Pulse 97  Resp 16  Wt 274 lb 6.4 oz (124.467 kg)  SpO2 97% Patient alert and oriented and in no cardiopulmonary distress.  HEENT: No facial asymmetry, EOMI,   oropharynx pink and moist.  Neck supple no JVD, no mass.  Chest: Clear to auscultation bilaterally.  CVS: S1, S2 no murmurs, no S3.  ABD: Soft non tender.   Ext: No edema  MS: Adequate ROM spine, shoulders, hips and knees.  Skin: Intact, no ulcerations or rash noted.  Psych: Good eye contact, . Memory intact  anxious not depressed appearing.  CNS: CN 2-12 intact, power,  normal throughout.no focal deficits noted.        Assessment & Plan:  Panic attack 2 disabling panic attacks in the past month, add buspar daily and have limited supply of xanax  available for panic attacks which do not respond to behavioral techniques, which were discussed at the visit  GAD (generalized anxiety disorder) daily scheduled buspar to be started  Depression Marked improvement , continue currentmed  MORBID OBESITY Deteriorated. Patient re-educated about  the importance of commitment to a  minimum of 150 minutes of exercise per week. The importance of healthy food choices with portion control discussed. Encouraged to start a food diary, count calories and to consider  joining a support group. Sample diet sheets offered. Goals set by the patient for the next several months.     Prediabetes Patient educated about the importance of limiting  Carbohydrate intake , the need to commit to daily physical activity for a minimum of 30 minutes , and to commit weight loss. The fact that changes in all these areas will reduce or eliminate all together the development of diabetes is stressed.   Updated lab needed at/ before next visit.   Metabolic syndrome X The increased risk of cardiovascular disease associated with this diagnosis, and the need to consistently work on lifestyle to change this is discussed. Following  a  heart healthy diet ,commitment to 30 minutes of exercise at least 5 days per week, as well as control of blood sugar and cholesterol , and achieving a healthy weight are all the areas to be addressed .   Dyslipidemia Hyperlipidemia:Low fat diet discussed and encouraged.

## 2014-03-03 NOTE — Patient Instructions (Signed)
F/u in 3 month, cancel sooner appt  New for anxiety is buspar every day, practice techniques discussed for panic and use xanax ONLY if needed  Generalized Anxiety Disorder Generalized anxiety disorder (GAD) is a mental disorder. It interferes with life functions, including relationships, work, and school. GAD is different from normal anxiety, which everyone experiences at some point in their lives in response to specific life events and activities. Normal anxiety actually helps us prepare for and get through these life events and activities. Normal anxiety goes away after the event or activity is over.  GAD causes anxiety that is not necessarily related to specific events or activities. It also causes excess anxiety in proportion to specific events or activities. The anxiety associated with GAD is also difficult to control. GAD can vary from mild to severe. People with severe GAD can have intense waves of anxiety with physical symptoms (panic attacks).  SYMPTOMS The anxiety and worry associated with GAD are difficult to control. This anxiety and worry are related to many life events and activities and also occur more days than not for 6 months or longer. People with GAD also have three or more of the following symptoms (one or more in children): Restlessness.  Fatigue. Difficulty concentrating.  Irritability. Muscle tension. Difficulty sleeping or unsatisfying sleep. DIAGNOSIS GAD is diagnosed through an assessment by your caregiver. Your caregiver will ask you questions aboutyour mood,physical symptoms, and events in your life. Your caregiver may ask you about your medical history and use of alcohol or drugs, including prescription medications. Your caregiver may also do a physical exam and blood tests. Certain medical conditions and the use of certain substances can cause symptoms similar to those associated with GAD. Your caregiver may refer you to a mental health specialist for further  evaluation. TREATMENT The following therapies are usually used to treat GAD:  Medication Antidepressant medication usually is prescribed for long-term daily control. Antianxiety medications may be added in severe cases, especially when panic attacks occur.  Talk therapy (psychotherapy) Certain types of talk therapy can be helpful in treating GAD by providing support, education, and guidance. A form of talk therapy called cognitive behavioral therapy can teach you healthy ways to think about and react to daily life events and activities. Stress managementtechniques These include yoga, meditation, and exercise and can be very helpful when they are practiced regularly. A mental health specialist can help determine which treatment is best for you. Some people see improvement with one therapy. However, other people require a combination of therapies. Document Released: 01/25/2013 Document Reviewed: 01/25/2013 [facility] Patient Information 2014 [place], [place]. Panic Attacks Panic attacks are sudden, short feelings of great fear or discomfort. You may have them for no reason when you are relaxed, when you are uneasy (anxious), or when you are sleeping.  HOME CARE  Take all your medicines as told.  Check with your doctor before starting new medicines.  Keep all doctor visits. GET HELP IF:  You are not able to take your medicines as told.  Your symptoms do not get better.  Your symptoms get worse. GET HELP RIGHT AWAY IF:  Your attacks seem different than your normal attacks.  You have thoughts about hurting yourself or others.  You take panic attack medicine and you have a side effect. MAKE SURE YOU:  Understand these instructions.  Will watch your condition.  Will get help right away if you are not doing well or get worse. Document Released: 11/02/2010 Document Revised: 07/21/2013 Document Reviewed:  05/14/2013 ExitCare Patient Information 2014 [place], [place].

## 2014-03-21 DIAGNOSIS — Z13220 Encounter for screening for lipoid disorders: Secondary | ICD-10-CM | POA: Insufficient documentation

## 2014-03-21 DIAGNOSIS — E8881 Metabolic syndrome: Secondary | ICD-10-CM | POA: Insufficient documentation

## 2014-03-21 DIAGNOSIS — E785 Hyperlipidemia, unspecified: Secondary | ICD-10-CM | POA: Insufficient documentation

## 2014-03-21 NOTE — Assessment & Plan Note (Signed)
Patient educated about the importance of limiting  Carbohydrate intake , the need to commit to daily physical activity for a minimum of 30 minutes , and to commit weight loss. The fact that changes in all these areas will reduce or eliminate all together the development of diabetes is stressed.   Updated lab needed at/ before next visit.  

## 2014-03-21 NOTE — Assessment & Plan Note (Signed)
Hyperlipidemia:Low fat diet discussed and encouraged.   

## 2014-03-21 NOTE — Assessment & Plan Note (Signed)
daily scheduled buspar to be started

## 2014-03-21 NOTE — Assessment & Plan Note (Signed)
Marked improvement , continue currentmed

## 2014-03-21 NOTE — Assessment & Plan Note (Signed)
The increased risk of cardiovascular disease associated with this diagnosis, and the need to consistently work on lifestyle to change this is discussed. Following  a  heart healthy diet ,commitment to 30 minutes of exercise at least 5 days per week, as well as control of blood sugar and cholesterol , and achieving a healthy weight are all the areas to be addressed .  

## 2014-03-21 NOTE — Assessment & Plan Note (Signed)
Deteriorated. Patient re-educated about  the importance of commitment to a  minimum of 150 minutes of exercise per week. The importance of healthy food choices with portion control discussed. Encouraged to start a food diary, count calories and to consider  joining a support group. Sample diet sheets offered. Goals set by the patient for the next several months.    

## 2014-03-21 NOTE — Assessment & Plan Note (Signed)
2 disabling panic attacks in the past month, add buspar daily and have limited supply of xanax available for panic attacks which do not respond to behavioral techniques, which were discussed at the visit

## 2014-03-28 ENCOUNTER — Telehealth: Payer: Self-pay | Admitting: Family Medicine

## 2014-03-28 ENCOUNTER — Encounter: Payer: Self-pay | Admitting: Family Medicine

## 2014-03-28 NOTE — Telephone Encounter (Signed)
Please advise. Urgent care?

## 2014-03-28 NOTE — Telephone Encounter (Signed)
Verbal order:   Patient should seek care at closest urgent care.

## 2014-03-28 NOTE — Telephone Encounter (Signed)
Patient aware that she should seek care at a local urgent care.

## 2014-04-29 ENCOUNTER — Ambulatory Visit: Payer: BC Managed Care – PPO | Admitting: Family Medicine

## 2014-05-30 ENCOUNTER — Telehealth: Payer: Self-pay

## 2014-05-30 NOTE — Telephone Encounter (Signed)
[facility] [address]

## 2014-06-04 LAB — LIPID PANEL
Cholesterol: 135 mg/dL (ref 0–200)
HDL: 44 mg/dL (ref >39)
LDL Cholesterol: 78 mg/dL (ref 0–99)
Total CHOL/HDL Ratio: 3.1 Ratio
Triglycerides: 65 mg/dL (ref <150)
VLDL: 13 mg/dL (ref 0–40)

## 2014-06-05 LAB — HEMOGLOBIN A1C
Hgb A1c MFr Bld: 5.9 % — ABNORMAL HIGH (ref <5.7)
Mean Plasma Glucose: 123 mg/dL — ABNORMAL HIGH (ref <117)

## 2014-06-06 ENCOUNTER — Ambulatory Visit (INDEPENDENT_AMBULATORY_CARE_PROVIDER_SITE_OTHER): Payer: BC Managed Care – PPO | Admitting: Family Medicine

## 2014-06-06 VITALS — BP 118/82 | Resp 16 | Ht 60.0 in | Wt 273.1 lb

## 2014-06-06 DIAGNOSIS — E8881 Metabolic syndrome: Secondary | ICD-10-CM

## 2014-06-06 DIAGNOSIS — R7309 Other abnormal glucose: Secondary | ICD-10-CM

## 2014-06-06 DIAGNOSIS — F3289 Other specified depressive episodes: Secondary | ICD-10-CM

## 2014-06-06 DIAGNOSIS — R7303 Prediabetes: Secondary | ICD-10-CM

## 2014-06-06 DIAGNOSIS — F32A Depression, unspecified: Secondary | ICD-10-CM

## 2014-06-06 DIAGNOSIS — F329 Major depressive disorder, single episode, unspecified: Secondary | ICD-10-CM

## 2014-06-06 DIAGNOSIS — Z23 Encounter for immunization: Secondary | ICD-10-CM

## 2014-06-06 DIAGNOSIS — F411 Generalized anxiety disorder: Secondary | ICD-10-CM

## 2014-06-06 NOTE — Patient Instructions (Addendum)
F/u in 3.5 month, call if you need me before  You will be contacted later his week with script and info on weight loss medication  Work on changing eating and exercise

## 2014-06-07 ENCOUNTER — Ambulatory Visit: Payer: BC Managed Care – PPO | Admitting: Family Medicine

## 2014-06-10 ENCOUNTER — Telehealth: Payer: Self-pay

## 2014-06-10 MED ORDER — PHENTERMINE-TOPIRAMATE ER 3.75-23 MG PO CP24: 1.0000 | ORAL_CAPSULE | Freq: Two times a day (BID) | ORAL | Status: DC

## 2014-06-10 NOTE — Telephone Encounter (Signed)
Will print rx , this is the one we are trying to get savinngsd coupon for, she may be able to print one on line , or you may be able to

## 2014-06-10 NOTE — Telephone Encounter (Signed)
Medication and savings voucher sent to pharmacy.  Patient aware and is expecting savings care in the mail as well.

## 2014-06-13 ENCOUNTER — Telehealth: Payer: Self-pay

## 2014-06-14 NOTE — Telephone Encounter (Signed)
Called and left message notifying patient that the original savings card is on the way to her in the mail.  She will need to take to pharmacy when she receives.

## 2014-06-20 ENCOUNTER — Encounter: Payer: Self-pay | Admitting: Family Medicine

## 2014-06-20 DIAGNOSIS — Z23 Encounter for immunization: Secondary | ICD-10-CM | POA: Insufficient documentation

## 2014-06-20 NOTE — Progress Notes (Signed)
   Subjective:    Patient ID: [name], female    DOB: 1981/02/18, 33 y.o.   MRN: [id number]  HPI The PT is here for follow up and re-evaluation of chronic medical conditions, medication management and review of any available recent lab and radiology data.  Preventive health is updated, specifically  Cancer screening and Immunization.   Questions or concerns regarding consultations or procedures which the PT has had in the interim are  addressed. The PT denies any adverse reactions to current medications since the last visit.  There are no new concerns. Wants help with weight loss struggling with weight despite attempts  There are no specific complaints       Review of Systems See HPI Denies recent fever or chills. Denies sinus pressure, nasal congestion, ear pain or sore throat. Denies chest congestion, productive cough or wheezing. Denies chest pains, palpitations and leg swelling Denies abdominal pain, nausea, vomiting,diarrhea or constipation.   Denies dysuria, frequency, hesitancy or incontinence. Denies joint pain, swelling and limitation in mobility. Denies headaches, seizures, numbness, or tingling. Denies uncontrolled  depression, anxiety or insomnia.No more panic attacks Denies skin break down or rash.        Objective:   Physical Exam BP 118/82  Resp 16  Ht 5' (1.524 m)  Wt 273 lb 1.9 oz (123.886 kg)  BMI 53.34 kg/m2  SpO2 98% Patient alert and oriented and in no cardiopulmonary distress.  HEENT: No facial asymmetry, EOMI,   oropharynx pink and moist.  Neck supple no JVD, no mass.  Chest: Clear to auscultation bilaterally.  CVS: S1, S2 no murmurs, no S3.Regular rate.  ABD: Soft non tender.   Ext: No edema  MS: Adequate ROM spine, shoulders, hips and knees.  Skin: Intact, no ulcerations or rash noted.  Psych: Good eye contact, normal affect. Memory intact not anxious or depressed appearing.  CNS: CN 2-12 intact, power,  normal  throughout.no focal deficits noted.         Assessment & Plan:  GAD (generalized anxiety disorder) Improved and controlled on current medication  Prediabetes Unchanged Patient educated about the importance of limiting  Carbohydrate intake , the need to commit to daily physical activity for a minimum of 30 minutes , and to commit weight loss. The fact that changes in all these areas will reduce or eliminate all together the development of diabetes is stressed.     Metabolic syndrome X The increased risk of cardiovascular disease associated with this diagnosis, and the need to consistently work on lifestyle to change this is discussed. Following  a  heart healthy diet ,commitment to 30 minutes of exercise at least 5 days per week, as well as control of blood sugar and cholesterol , and achieving a healthy weight are all the areas to be addressed .   Depression Controlled, no change in medication   MORBID OBESITY Deteriorated. Patient re-educated about  the importance of commitment to a  minimum of 150 minutes of exercise per week. The importance of healthy food choices with portion control discussed. Encouraged to start a food diary, count calories and to consider  joining a support group. Sample diet sheets offered. Goals set by the patient for the next several months.  Pt started on appetite suppressant, 2 week supply prescribed, should increase dose after this , she is to call back if she will continue this   Need for prophylactic vaccination and inoculation against influenza Vaccine administered at visit.

## 2014-06-20 NOTE — Assessment & Plan Note (Signed)
Unchanged Patient educated about the importance of limiting  Carbohydrate intake , the need to commit to daily physical activity for a minimum of 30 minutes , and to commit weight loss. The fact that changes in all these areas will reduce or eliminate all together the development of diabetes is stressed.    

## 2014-06-20 NOTE — Assessment & Plan Note (Signed)
The increased risk of cardiovascular disease associated with this diagnosis, and the need to consistently work on lifestyle to change this is discussed. Following  a  heart healthy diet ,commitment to 30 minutes of exercise at least 5 days per week, as well as control of blood sugar and cholesterol , and achieving a healthy weight are all the areas to be addressed .  

## 2014-06-20 NOTE — Assessment & Plan Note (Signed)
Improved and controlled on current medication 

## 2014-06-20 NOTE — Assessment & Plan Note (Signed)
Controlled, no change in medication  

## 2014-06-20 NOTE — Assessment & Plan Note (Signed)
Deteriorated. Patient re-educated about  the importance of commitment to a  minimum of 150 minutes of exercise per week. The importance of healthy food choices with portion control discussed. Encouraged to start a food diary, count calories and to consider  joining a support group. Sample diet sheets offered. Goals set by the patient for the next several months.  Pt started on appetite suppressant, 2 week supply prescribed, should increase dose after this , she is to call back if she will continue this

## 2014-06-20 NOTE — Assessment & Plan Note (Signed)
Vaccine administered at visit.  

## 2014-09-16 ENCOUNTER — Encounter: Payer: Self-pay | Admitting: Family Medicine

## 2014-09-16 ENCOUNTER — Ambulatory Visit (INDEPENDENT_AMBULATORY_CARE_PROVIDER_SITE_OTHER): Payer: BC Managed Care – PPO | Admitting: Family Medicine

## 2014-09-16 VITALS — BP 120/76 | HR 84 | Resp 18 | Ht 60.0 in | Wt 273.0 lb

## 2014-09-16 DIAGNOSIS — E8881 Metabolic syndrome: Secondary | ICD-10-CM

## 2014-09-16 DIAGNOSIS — F32A Depression, unspecified: Secondary | ICD-10-CM

## 2014-09-16 DIAGNOSIS — F329 Major depressive disorder, single episode, unspecified: Secondary | ICD-10-CM

## 2014-09-16 DIAGNOSIS — R7303 Prediabetes: Secondary | ICD-10-CM

## 2014-09-16 DIAGNOSIS — F411 Generalized anxiety disorder: Secondary | ICD-10-CM

## 2014-09-16 DIAGNOSIS — R7309 Other abnormal glucose: Secondary | ICD-10-CM

## 2014-09-16 MED ORDER — PHENTERMINE HCL 37.5 MG PO TABS: 37.5000 mg | ORAL_TABLET | Freq: Every day | ORAL | Status: DC

## 2014-09-18 NOTE — Assessment & Plan Note (Signed)
The increased risk of cardiovascular disease associated with this diagnosis, and the need to consistently work on lifestyle to change this is discussed. Following  a  heart healthy diet ,commitment to 30 minutes of exercise at least 5 days per week, as well as control of blood sugar and cholesterol , and achieving a healthy weight are all the areas to be addressed .  

## 2014-09-18 NOTE — Assessment & Plan Note (Signed)
Deteriorated. Patient re-educated about  the importance of commitment to a  minimum of 150 minutes of exercise per week. The importance of healthy food choices with portion control discussed. Encouraged to start a food diary, count calories and to consider  joining a support group. Sample diet sheets offered. Goals set by the patient for the next several months.  Resume half phentermine daily  

## 2014-09-18 NOTE — Progress Notes (Signed)
   Subjective:    Patient ID: Michelle StanleyShamica D Nolasco, female    DOB: 1981-02-13, 33 y.o.   MRN: [id number]  HPI The PT is here for follow up and re-evaluation of chronic medical conditions, medication management and review of any available recent lab and radiology data.  Preventive health is updated, specifically  Cancer screening and Immunization.   Questions or concerns regarding consultations or procedures which the PT has had in the interim are  addressed. Stopped both buspar and effexor about 6 weeks ago,  Was forgetting doses and felt funny when she did not take them. Wants to resume phentermine, no real benefit noted with qysmia   Exercise still irregular and eating habits are being addressed     Review of Systems See HPI Denies recent fever or chills. Denies sinus pressure, nasal congestion, ear pain or sore throat. Denies chest congestion, productive cough or wheezing. Denies chest pains, palpitations and leg swelling Denies abdominal pain, nausea, vomiting,diarrhea or constipation.   Denies dysuria, frequency, hesitancy or incontinence. Denies joint pain, swelling and limitation in mobility. Denies headaches, seizures, numbness, or tingling. Denies depression, anxiety or insomnia. Denies skin break down or rash.        Objective:   Physical Exam BP 120/76 mmHg  Pulse 84  Resp 18  Ht 5' (1.524 m)  Wt 273 lb 0.6 oz (123.85 kg)  BMI 53.32 kg/m2  SpO2 99% Patient alert and oriented and in no cardiopulmonary distress.  HEENT: No facial asymmetry, EOMI,   oropharynx pink and moist.  Neck supple no JVD, no mass.  Chest: Clear to auscultation bilaterally.  CVS: S1, S2 no murmurs, no S3.Regular rate.  ABD: Soft non tender.   Ext: No edema  MS: Adequate ROM spine, shoulders, hips and knees.  Skin: Intact, no ulcerations or rash noted.  Psych: Good eye contact, normal affect. Memory intact not anxious or depressed appearing.  CNS: CN 2-12 intact, power,  normal  throughout.no focal deficits noted.        Assessment & Plan:  GAD (generalized anxiety disorder) needs to resume buspar  Prediabetes Patient educated about the importance of limiting  Carbohydrate intake , the need to commit to daily physical activity for a minimum of 30 minutes , and to commit weight loss. The fact that changes in all these areas will reduce or eliminate all together the development of diabetes is stressed.   Updated lab needed at/ before next visit.   MORBID OBESITY Deteriorated. Patient re-educated about  the importance of commitment to a  minimum of 150 minutes of exercise per week. The importance of healthy food choices with portion control discussed. Encouraged to start a food diary, count calories and to consider  joining a support group. Sample diet sheets offered. Goals set by the patient for the next several months.   Resume half phentermine daily  Metabolic syndrome X The increased risk of cardiovascular disease associated with this diagnosis, and the need to consistently work on lifestyle to change this is discussed. Following  a  heart healthy diet ,commitment to 30 minutes of exercise at least 5 days per week, as well as control of blood sugar and cholesterol , and achieving a healthy weight are all the areas to be addressed .

## 2014-09-18 NOTE — Assessment & Plan Note (Signed)
Improved and has been off effexor for neary 6 weeks, d/c same

## 2014-09-18 NOTE — Assessment & Plan Note (Signed)
needs to resume buspar

## 2014-09-18 NOTE — Assessment & Plan Note (Signed)
Patient educated about the importance of limiting  Carbohydrate intake , the need to commit to daily physical activity for a minimum of 30 minutes , and to commit weight loss. The fact that changes in all these areas will reduce or eliminate all together the development of diabetes is stressed.   Updated lab needed at/ before next visit.  

## 2015-01-18 ENCOUNTER — Ambulatory Visit: Payer: BC Managed Care – PPO | Admitting: Family Medicine

## 2015-02-06 ENCOUNTER — Telehealth: Payer: Self-pay

## 2015-02-06 DIAGNOSIS — E8881 Metabolic syndrome: Secondary | ICD-10-CM

## 2015-02-06 DIAGNOSIS — E785 Hyperlipidemia, unspecified: Secondary | ICD-10-CM

## 2015-02-06 DIAGNOSIS — R7303 Prediabetes: Secondary | ICD-10-CM

## 2015-02-06 LAB — BASIC METABOLIC PANEL
BUN: 13 mg/dL (ref 6–23)
CALCIUM: 8.6 mg/dL (ref 8.4–10.5)
CO2: 24 mEq/L (ref 19–32)
Chloride: 106 mEq/L (ref 96–112)
Creat: 0.93 mg/dL (ref 0.50–1.10)
Glucose, Bld: 100 mg/dL — ABNORMAL HIGH (ref 70–99)
POTASSIUM: 4.4 meq/L (ref 3.5–5.3)
Sodium: 139 mEq/L (ref 135–145)

## 2015-02-06 LAB — CBC
HCT: 36.3 % (ref 36.0–46.0)
Hemoglobin: 11.4 g/dL — ABNORMAL LOW (ref 12.0–15.0)
MCH: 26.7 pg (ref 26.0–34.0)
MCHC: 31.4 g/dL (ref 30.0–36.0)
MCV: 85 fL (ref 78.0–100.0)
MPV: 9.7 fL (ref 8.6–12.4)
Platelets: 364 10*3/uL (ref 150–400)
RBC: 4.27 MIL/uL (ref 3.87–5.11)
RDW: 14.7 % (ref 11.5–15.5)
WBC: 5.9 10*3/uL (ref 4.0–10.5)

## 2015-02-06 LAB — TSH: TSH: 0.796 u[IU]/mL (ref 0.350–4.500)

## 2015-02-06 LAB — HEMOGLOBIN A1C
HEMOGLOBIN A1C: 5.9 % — AB (ref <5.7)
Mean Plasma Glucose: 123 mg/dL — ABNORMAL HIGH (ref <117)

## 2015-02-06 LAB — LIPID PANEL
CHOL/HDL RATIO: 3.4 ratio
CHOLESTEROL: 143 mg/dL (ref 0–200)
HDL: 42 mg/dL — ABNORMAL LOW (ref >=46)
LDL Cholesterol: 91 mg/dL (ref 0–99)
TRIGLYCERIDES: 50 mg/dL (ref <150)
VLDL: 10 mg/dL (ref 0–40)

## 2015-02-06 NOTE — Telephone Encounter (Signed)
Labs ordered.

## 2015-02-07 ENCOUNTER — Encounter: Payer: Self-pay | Admitting: Family Medicine

## 2015-02-07 ENCOUNTER — Ambulatory Visit (INDEPENDENT_AMBULATORY_CARE_PROVIDER_SITE_OTHER): Payer: BLUE CROSS/BLUE SHIELD | Admitting: Family Medicine

## 2015-02-07 VITALS — BP 122/88 | HR 98 | Resp 18 | Ht 60.0 in | Wt 274.1 lb

## 2015-02-07 DIAGNOSIS — R7303 Prediabetes: Secondary | ICD-10-CM

## 2015-02-07 DIAGNOSIS — E785 Hyperlipidemia, unspecified: Secondary | ICD-10-CM | POA: Diagnosis not present

## 2015-02-07 DIAGNOSIS — E8881 Metabolic syndrome: Secondary | ICD-10-CM | POA: Diagnosis not present

## 2015-02-07 DIAGNOSIS — R7309 Other abnormal glucose: Secondary | ICD-10-CM

## 2015-02-07 DIAGNOSIS — F411 Generalized anxiety disorder: Secondary | ICD-10-CM | POA: Diagnosis not present

## 2015-02-07 MED ORDER — PHENTERMINE HCL 37.5 MG PO TBDP: ORAL_TABLET | ORAL | Status: DC

## 2015-02-07 NOTE — Patient Instructions (Addendum)
F/u in 3 month, call if you need me before  1500 calorie diet sheet  Increase phentermine to one daily  Commit to walking for 15 mins twice daily  Aim for 10 pound weight loss in next 3 months  Start iron OTC 325 mg one daily  Thanks for choosing [facility], we consider it a privelige to serve you.

## 2015-02-07 NOTE — Progress Notes (Signed)
Subjective:    Patient ID: [name], female    DOB: 03-02-1981, 34 y.o.   MRN: [id number]  HPI The PT is here for follow up and re-evaluation of chronic medical conditions, medication management and review of any available recent lab and radiology data.  Preventive health is updated, specifically  Cancer screening and Immunization.    The PT denies any adverse reactions to current medications since the last visit.  There are no new concerns.  There are no specific complaints       Review of Systems See HPI Denies recent fever or chills. Denies sinus pressure, nasal congestion, ear pain or sore throat. Denies chest congestion, productive cough or wheezing. Denies chest pains, palpitations and leg swelling Denies abdominal pain, nausea, vomiting,diarrhea or constipation.   Denies dysuria, frequency, hesitancy or incontinence. Denies joint pain, swelling and limitation in mobility. Denies headaches, seizures, numbness, or tingling. Denies depression, anxiety or insomnia. Denies skin break down or rash.        Objective:   Physical Exam BP 122/88 mmHg  Pulse 98  Resp 18  Ht 5' (1.524 m)  Wt 274 lb 1.3 oz (124.322 kg)  BMI 53.53 kg/m2  SpO2 98% Patient alert and oriented and in no cardiopulmonary distress.  HEENT: No facial asymmetry, EOMI,   oropharynx pink and moist.  Neck supple no JVD, no mass.  Chest: Clear to auscultation bilaterally.  CVS: S1, S2 no murmurs, no S3.Regular rate.  ABD: Soft non tender.   Ext: No edema  MS: Adequate ROM spine, shoulders, hips and knees.  Skin: Intact, no ulcerations or rash noted.  Psych: Good eye contact, normal affect. Memory intact not anxious or depressed appearing.  CNS: CN 2-12 intact, power,  normal throughout.no focal deficits noted.        Assessment & Plan:  MORBID OBESITY Unchanged. Patient re-educated about  the importance of commitment to a  minimum of 150 minutes of exercise per week.    The importance of healthy food choices with portion control discussed. Encouraged to start a food diary, count calories and to consider  joining a support group. Sample diet sheets offered. Goals set by the patient for the next several months.   Weight /BMI 02/07/2015 09/16/2014 06/06/2014  WEIGHT 274 lb 1.3 oz 273 lb 0.6 oz 273 lb 1.9 oz  HEIGHT 5\' 0"  5\' 0"  5\' 0"   BMI 53.53 kg/m2 53.32 kg/m2 53.34 kg/m2    Current exercise per week 90 minutes.    GAD (generalized anxiety disorder) Controlled, no change in medication    Metabolic syndrome X The increased risk of cardiovascular disease associated with this diagnosis, and the need to consistently work on lifestyle to change this is discussed. Following  a  heart healthy diet ,commitment to 30 minutes of exercise at least 5 days per week, as well as control of blood sugar and cholesterol , and achieving a healthy weight are all the areas to be addressed .    Dyslipidemia Hyperlipidemia:Low fat diet discussed and encouraged. Improved, however, low HDL, needs to commit to regular exercise   Lipid Panel  Lab Results  Component Value Date   CHOL 143 02/06/2015   HDL 42* 02/06/2015   LDLCALC 91 02/06/2015   TRIG 50 02/06/2015   CHOLHDL 3.4 02/06/2015         Prediabetes Unchanged Patient educated about the importance of limiting  Carbohydrate intake , the need to commit to daily physical activity for a minimum of 30  minutes , and to commit weight loss. The fact that changes in all these areas will reduce or eliminate all together the development of diabetes is stressed.   Diabetic Labs Latest Ref Rng 02/06/2015 06/04/2014 10/30/2013 07/17/2013 10/28/2012  HbA1c <5.7 % 5.9(H) 5.9(H) 6.0(H) 6.0(H) 5.7(H)  Chol 0 - 200 mg/dL 161 096 045 - 409  HDL >=46 mg/dL 81(X) 44 46 - 91(Y)  Calc LDL 0 - 99 mg/dL 91 78 782(N) - 86  Triglycerides <150 mg/dL 50 65 65 - 59  Creatinine 0.50 - 1.10 mg/dL 5.62 - 1.30 - 8.65   BP/Weight  02/07/2015 09/16/2014 06/06/2014 03/03/2014 01/10/2014 11/04/2013 07/23/2013  Systolic BP 122 120 118 118 116 114 118  Diastolic BP 88 76 82 80 78 78 78  Wt. (Lbs) 274.08 273.04 273.12 274.4 271 267 271  BMI 53.53 53.32 53.34 53.59 52.93 52.14 52.93   No flowsheet data found.

## 2015-02-19 NOTE — Assessment & Plan Note (Signed)
Unchanged. Patient re-educated about  the importance of commitment to a  minimum of 150 minutes of exercise per week.  The importance of healthy food choices with portion control discussed. Encouraged to start a food diary, count calories and to consider  joining a support group. Sample diet sheets offered. Goals set by the patient for the next several months.   Weight /BMI 02/07/2015 09/16/2014 06/06/2014  WEIGHT 274 lb 1.3 oz 273 lb 0.6 oz 273 lb 1.9 oz  HEIGHT 5\' 0"  5\' 0"  5\' 0"   BMI 53.53 kg/m2 53.32 kg/m2 53.34 kg/m2    Current exercise per week 90 minutes.

## 2015-02-19 NOTE — Assessment & Plan Note (Signed)
Controlled, no change in medication  

## 2015-02-19 NOTE — Assessment & Plan Note (Signed)
The increased risk of cardiovascular disease associated with this diagnosis, and the need to consistently work on lifestyle to change this is discussed. Following  a  heart healthy diet ,commitment to 30 minutes of exercise at least 5 days per week, as well as control of blood sugar and cholesterol , and achieving a healthy weight are all the areas to be addressed .  

## 2015-02-19 NOTE — Assessment & Plan Note (Signed)
Hyperlipidemia:Low fat diet discussed and encouraged. Improved, however, low HDL, needs to commit to regular exercise   Lipid Panel  Lab Results  Component Value Date   CHOL 143 02/06/2015   HDL 42* 02/06/2015   LDLCALC 91 02/06/2015   TRIG 50 02/06/2015   CHOLHDL 3.4 02/06/2015

## 2015-02-19 NOTE — Assessment & Plan Note (Signed)
Unchanged Patient educated about the importance of limiting  Carbohydrate intake , the need to commit to daily physical activity for a minimum of 30 minutes , and to commit weight loss. The fact that changes in all these areas will reduce or eliminate all together the development of diabetes is stressed.   Diabetic Labs Latest Ref Rng 02/06/2015 06/04/2014 10/30/2013 07/17/2013 10/28/2012  HbA1c <5.7 % 5.9(H) 5.9(H) 6.0(H) 6.0(H) 5.7(H)  Chol 0 - 200 mg/dL 119143 147135 829161 - 562137  HDL >=46 mg/dL 13(Y42(L) 44 46 - 86(V39(L)  Calc LDL 0 - 99 mg/dL 91 78 784(O102(H) - 86  Triglycerides <150 mg/dL 50 65 65 - 59  Creatinine 0.50 - 1.10 mg/dL 9.620.93 - 9.520.96 - 8.410.98   BP/Weight 02/07/2015 09/16/2014 06/06/2014 03/03/2014 01/10/2014 11/04/2013 07/23/2013  Systolic BP 122 120 118 118 116 114 118  Diastolic BP 88 76 82 80 78 78 78  Wt. (Lbs) 274.08 273.04 273.12 274.4 271 267 271  BMI 53.53 53.32 53.34 53.59 52.93 52.14 52.93   No flowsheet data found.

## 2015-03-21 ENCOUNTER — Emergency Department (HOSPITAL_COMMUNITY)
Admission: EM | Admit: 2015-03-21 | Discharge: 2015-03-21 | Disposition: A | Discharge status: Home / Self Care | Payer: BLUE CROSS/BLUE SHIELD | Attending: Emergency Medicine | Admitting: Emergency Medicine

## 2015-03-21 ENCOUNTER — Encounter (HOSPITAL_COMMUNITY): Payer: Self-pay | Admitting: Emergency Medicine

## 2015-03-21 ENCOUNTER — Encounter: Payer: Self-pay | Admitting: Family Medicine

## 2015-03-21 DIAGNOSIS — Y9289 Other specified places as the place of occurrence of the external cause: Secondary | ICD-10-CM | POA: Insufficient documentation

## 2015-03-21 DIAGNOSIS — Y9389 Activity, other specified: Secondary | ICD-10-CM | POA: Insufficient documentation

## 2015-03-21 DIAGNOSIS — Z79899 Other long term (current) drug therapy: Secondary | ICD-10-CM | POA: Insufficient documentation

## 2015-03-21 DIAGNOSIS — W57XXXA Bitten or stung by nonvenomous insect and other nonvenomous arthropods, initial encounter: Secondary | ICD-10-CM | POA: Insufficient documentation

## 2015-03-21 DIAGNOSIS — S80861A Insect bite (nonvenomous), right lower leg, initial encounter: Secondary | ICD-10-CM | POA: Insufficient documentation

## 2015-03-21 DIAGNOSIS — Y998 Other external cause status: Secondary | ICD-10-CM | POA: Diagnosis not present

## 2015-03-21 MED ORDER — DIPHENHYDRAMINE HCL 25 MG PO CAPS
50.0000 mg | ORAL_CAPSULE | Freq: Once | ORAL | Status: AC
  Administered 2015-03-21: 50 mg via ORAL
  Filled 2015-03-21: qty 2

## 2015-03-21 NOTE — Discharge Instructions (Signed)

## 2015-03-21 NOTE — ED Notes (Signed)
Notice insect bite yesterday, redness and swelling noted to lower right leg.  Had SOB this am.  Denies any pain at this time.

## 2015-03-21 NOTE — ED Provider Notes (Signed)
CSN: [id number]     Arrival date & time 03/21/15  0807 History   First MD Initiated Contact with Patient 03/21/15 0815     Chief Complaint  Patient presents with  . Insect Bite  . Shortness of Breath     (Consider location/radiation/quality/duration/timing/severity/associated sxs/prior Treatment) Patient is a 34 y.o. female presenting with shortness of breath. The history is provided by the patient. No language interpreter was used.  Shortness of Breath Severity:  Moderate Onset quality:  Gradual Duration:  1 day Timing:  Constant Relieved by:  Nothing Worsened by:  Nothing tried Ineffective treatments:  None tried Pt was bitten by an insect yesterday and feels short of breath today.  Swelling to right leg.  Past Medical History  Diagnosis Date  . Morbid obesity    Past Surgical History  Procedure Laterality Date  . Cesarean section      x 3  . Tubal ligation     Family History  Problem Relation Age of Onset  . Heart disease Mother   . Diabetes Brother    History  Substance Use Topics  . Smoking status: Never Smoker   . Smokeless tobacco: Not on file  . Alcohol Use: No   OB History    No data available     Review of Systems  Respiratory: Positive for shortness of breath.   All other systems reviewed and are negative.     Allergies  Hydrocodone and Penicillins  Home Medications   Prior to Admission medications   Medication Sig Start Date End Date Taking? Authorizing Provider  busPIRone (BUSPAR) 5 MG tablet Take 1 tablet (5 mg total) by mouth 3 (three) times daily. 03/03/14   Kerri PerchesMargaret E Simpson, MD  Phentermine HCl 37.5 MG TBDP One tablet every morning before breakfast 02/07/15   Kerri PerchesMargaret E Simpson, MD   BP 141/95 mmHg  Pulse 93  Temp(Src) 98.1 F (36.7 C)  Resp 20  Ht 5' (1.524 m)  Wt 250 lb (113.399 kg)  BMI 48.82 kg/m2  SpO2 100%  LMP 02/28/2015 Physical Exam  Constitutional: She is oriented to person, place, and time. She appears  well-developed and well-nourished.  HENT:  Head: Normocephalic.  Eyes: EOM are normal.  Neck: Normal range of motion.  Pulmonary/Chest: Effort normal.  Abdominal: She exhibits no distension.  Musculoskeletal: Normal range of motion. She exhibits tenderness.  Localized selling at bite site, no infection, local reaction only  Neurological: She is alert and oriented to person, place, and time.  Psychiatric: She has a normal mood and affect.  Nursing note and vitals reviewed.   ED Course  Procedures (including critical care time) Labs Review Labs Reviewed - No data to display  Imaging Review No results found.   EKG Interpretation None      MDM  I advised benadryl every 4 hours.  Topical hydrocortisone ointment   Final diagnoses:  Insect bite    Benadryl Hydrocortisone     Elson AreasLeslie K Sofia, PA-C 03/21/15 [id number]  Gilda Creasehristopher J Pollina, MD 03/21/15 [phone]

## 2015-05-01 ENCOUNTER — Encounter: Payer: Self-pay | Admitting: Family Medicine

## 2015-05-01 ENCOUNTER — Ambulatory Visit (INDEPENDENT_AMBULATORY_CARE_PROVIDER_SITE_OTHER): Payer: BLUE CROSS/BLUE SHIELD | Admitting: Family Medicine

## 2015-05-01 VITALS — BP 118/80 | HR 86 | Resp 18 | Ht 60.0 in | Wt 275.0 lb

## 2015-05-01 DIAGNOSIS — E785 Hyperlipidemia, unspecified: Secondary | ICD-10-CM | POA: Diagnosis not present

## 2015-05-01 DIAGNOSIS — F411 Generalized anxiety disorder: Secondary | ICD-10-CM | POA: Diagnosis not present

## 2015-05-01 DIAGNOSIS — R7303 Prediabetes: Secondary | ICD-10-CM

## 2015-05-01 DIAGNOSIS — R7309 Other abnormal glucose: Secondary | ICD-10-CM

## 2015-05-01 DIAGNOSIS — F410 Panic disorder [episodic paroxysmal anxiety] without agoraphobia: Secondary | ICD-10-CM

## 2015-05-01 DIAGNOSIS — M7989 Other specified soft tissue disorders: Secondary | ICD-10-CM

## 2015-05-01 DIAGNOSIS — E8881 Metabolic syndrome: Secondary | ICD-10-CM

## 2015-05-01 MED ORDER — PHENTERMINE HCL 37.5 MG PO TBDP: ORAL_TABLET | ORAL | Status: DC

## 2015-05-01 MED ORDER — IBUPROFEN 600 MG PO TABS: 600.0000 mg | ORAL_TABLET | Freq: Two times a day (BID) | ORAL | Status: DC

## 2015-05-01 NOTE — Patient Instructions (Addendum)
F/u in 3 month, call if you need me before  You are referred to nutritionist,   You will get a diet sheet  It is important that you exercise regularly at least 30 minutes 7 times a week. If you develop chest pain, have severe difficulty breathing, or feel very tired, stop exercising immediately and seek medical attention     Resume phentermine one daily  Goal is 9 to 12 pounds of weight loss  Pls resume buspar.. For right ankle pain and swelling, early arthritis , ibuprofen for 5 days, then tylenol as needed  Watch heels and lose weight  For leg swelling elevate feet  Increase vege and fruit intake fresh/frozen , and water only

## 2015-05-08 DIAGNOSIS — M7989 Other specified soft tissue disorders: Secondary | ICD-10-CM | POA: Insufficient documentation

## 2015-05-08 NOTE — Assessment & Plan Note (Signed)
The increased risk of cardiovascular disease associated with this diagnosis, and the need to consistently work on lifestyle to change this is discussed. Following  a  heart healthy diet ,commitment to 30 minutes of exercise at least 5 days per week, as well as control of blood sugar and cholesterol , and achieving a healthy weight are all the areas to be addressed .  

## 2015-05-08 NOTE — Assessment & Plan Note (Signed)
Hyperlipidemia:Low fat diet discussed and encouraged.   Lipid Panel  Lab Results  Component Value Date   CHOL 143 02/06/2015   HDL 42* 02/06/2015   LDLCALC 91 02/06/2015   TRIG 50 02/06/2015   CHOLHDL 3.4 02/06/2015   Need to commit to daily exercise to increase HDL

## 2015-05-08 NOTE — Assessment & Plan Note (Signed)
Deteriorated. Patient re-educated about  the importance of commitment to a  minimum of 150 minutes of exercise per week.  The importance of healthy food choices with portion control discussed. Encouraged to start a food diary, count calories and to consider  joining a support group. Sample diet sheets offered. Goals set by the patient for the next several months.   Weight /BMI 05/01/2015 03/21/2015 02/07/2015  WEIGHT 275 lb 250 lb 274 lb 1.3 oz  HEIGHT     BMI 53.71 kg/m2 48.82 kg/m2 53.53 kg/m2    Current exercise per week 30 minutes.

## 2015-05-08 NOTE — Assessment & Plan Note (Signed)
Denies any recent attacks

## 2015-05-08 NOTE — Assessment & Plan Note (Signed)
Not well controlled as she is non compliant

## 2015-05-08 NOTE — Assessment & Plan Note (Addendum)
Patient educated about the importance of limiting  Carbohydrate intake , the need to commit to daily physical activity for a minimum of 30 minutes , and to commit weight loss. The fact that changes in all these areas will reduce or eliminate all together the development of diabetes is stressed.   Unchnaged  Diabetic Labs Latest Ref Rng 02/06/2015 06/04/2014 10/30/2013 07/17/2013 10/28/2012  HbA1c <5.7 % 5.9(H) 5.9(H) 6.0(H) 6.0(H) 5.7(H)  Chol 0 - 200 mg/dL 409 811 914 - 782  HDL >=46 mg/dL 95(A) 44 46 - 21(H)  Calc LDL 0 - 99 mg/dL 91 78 086(V) - 86  Triglycerides <150 mg/dL 50 65 65 - 59  Creatinine 0.50 - 1.10 mg/dL 7.84 - 6.96 - 2.95   BP/Weight 05/01/2015 03/21/2015 02/07/2015 09/16/2014 06/06/2014 03/03/2014 01/10/2014  Systolic BP 118 141 122 120 118 118 116  Diastolic BP 80 95 88 76 82 80 78  Wt. (Lbs) 275 250 274.08 273.04 273.12 274.4 271  BMI 53.71 48.82 53.53 53.32 53.34 53.59 52.93   No flowsheet data found.

## 2015-05-08 NOTE — Assessment & Plan Note (Signed)
Normal exam, trace edema,advised low sodium and leg elevation, reassured no pathology on exam or in history. Advised weight loss also and appropriate footwear , has been wearing heels

## 2015-05-08 NOTE — Progress Notes (Signed)
Subjective:    Patient ID: [name], female    DOB: July 16, 1981, 34 y.o.   MRN: [id number]  HPI The PT is here for follow up and re-evaluation of chronic medical conditions, medication management and review of any available recent lab and radiology data.  Preventive health is updated, specifically  Cancer screening and Immunization.   Questions or concerns regarding consultations or procedures which the PT has had in the interim are  addressed. The PT denies any adverse reactions to current medications since the last visit. Has been taking meds inconsistently No regular exercise, and no real focus on weight loss with subsequent weigth gain C/o leg swelling after wearing high heels this past Sunday also noted at end of the day , denies PND or orthopnea    Review of Systems See HPI Denies recent fever or chills. Denies sinus pressure, nasal congestion, ear pain or sore throat. Denies chest congestion, productive cough or wheezing. Denies chest pains, palpitations  Denies abdominal pain, nausea, vomiting,diarrhea or constipation.   Denies dysuria, frequency, hesitancy or incontinence. Denies joint pain, swelling and limitation in mobility. Denies headaches, seizures, numbness, or tingling. Denies depression, anxiety or insomnia. Denies skin break down or rash.        Objective:   Physical Exam  BP 118/80 mmHg  Pulse 86  Resp 18  Ht 5' (1.524 m)  Wt 275 lb (124.739 kg)  BMI 53.71 kg/m2  SpO2 98% Patient alert and oriented and in no cardiopulmonary distress.  HEENT: No facial asymmetry, EOMI,   oropharynx pink and moist.  Neck supple no JVD, no mass.  Chest: Clear to auscultation bilaterally.  CVS: S1, S2 no murmurs, no S3.Regular rate.  ABD: Soft non tender.   Ext: No edema  MS: Adequate ROM spine, shoulders, hips and knees.  Skin: Intact, no ulcerations or rash noted.  Psych: Good eye contact, normal affect. Memory intact not anxious or depressed  appearing.  CNS: CN 2-12 intact, power,  normal throughout.no focal deficits noted.       Assessment & Plan:  MORBID OBESITY Deteriorated. Patient re-educated about  the importance of commitment to a  minimum of 150 minutes of exercise per week.  The importance of healthy food choices with portion control discussed. Encouraged to start a food diary, count calories and to consider  joining a support group. Sample diet sheets offered. Goals set by the patient for the next several months.   Weight /BMI 05/01/2015 03/21/2015 02/07/2015  WEIGHT 275 lb 250 lb 274 lb 1.3 oz  HEIGHT 5\' 0"  5\' 0"  5\' 0"   BMI 53.71 kg/m2 48.82 kg/m2 53.53 kg/m2    Current exercise per week 30 minutes.   Prediabetes Patient educated about the importance of limiting  Carbohydrate intake , the need to commit to daily physical activity for a minimum of 30 minutes , and to commit weight loss. The fact that changes in all these areas will reduce or eliminate all together the development of diabetes is stressed.   Unchnaged  Diabetic Labs Latest Ref Rng 02/06/2015 06/04/2014 10/30/2013 07/17/2013 10/28/2012  HbA1c <5.7 % 5.9(H) 5.9(H) 6.0(H) 6.0(H) 5.7(H)  Chol 0 - 200 mg/dL 782 956 213 - 086  HDL >=46 mg/dL 57(Q) 44 46 - 46(N)  Calc LDL 0 - 99 mg/dL 91 78 629(B) - 86  Triglycerides <150 mg/dL 50 65 65 - 59  Creatinine 0.50 - 1.10 mg/dL 2.84 - 1.32 - 4.40   BP/Weight 05/01/2015 03/21/2015 02/07/2015 09/16/2014 06/06/2014 03/03/2014 01/10/2014  Systolic BP 118 141 122 120 118 118 116  Diastolic BP 80 95 88 76 82 80 78  Wt. (Lbs) 275 250 274.08 273.04 273.12 274.4 271  BMI 53.71 48.82 53.53 53.32 53.34 53.59 52.93   No flowsheet data found.     Dyslipidemia Hyperlipidemia:Low fat diet discussed and encouraged.   Lipid Panel  Lab Results  Component Value Date   CHOL 143 02/06/2015   HDL 42* 02/06/2015   LDLCALC 91 02/06/2015   TRIG 50 02/06/2015   CHOLHDL 3.4 02/06/2015   Need to commit to daily exercise to  increase HDL     Metabolic syndrome X The increased risk of cardiovascular disease associated with this diagnosis, and the need to consistently work on lifestyle to change this is discussed. Following  a  heart healthy diet ,commitment to 30 minutes of exercise at least 5 days per week, as well as control of blood sugar and cholesterol , and achieving a healthy weight are all the areas to be addressed .   GAD (generalized anxiety disorder) Not well controlled as she is non compliant  Panic attack Denies any recent attacks  Leg swelling Normal exam, trace edema,advised low sodium and leg elevation, reassured no pathology on exam or in history. Advised weight loss also and appropriate footwear , has been wearing heels

## 2015-05-11 ENCOUNTER — Ambulatory Visit: Payer: Self-pay | Admitting: Family Medicine

## 2015-06-12 ENCOUNTER — Encounter: Payer: Self-pay | Admitting: *Deleted

## 2015-06-12 ENCOUNTER — Encounter: Payer: BLUE CROSS/BLUE SHIELD | Attending: Family Medicine | Admitting: *Deleted

## 2015-06-12 DIAGNOSIS — E8881 Metabolic syndrome: Secondary | ICD-10-CM | POA: Insufficient documentation

## 2015-06-12 DIAGNOSIS — R7309 Other abnormal glucose: Secondary | ICD-10-CM | POA: Insufficient documentation

## 2015-06-12 DIAGNOSIS — Z713 Dietary counseling and surveillance: Secondary | ICD-10-CM | POA: Diagnosis not present

## 2015-06-12 DIAGNOSIS — Z6841 Body Mass Index (BMI) 40.0 and over, adult: Secondary | ICD-10-CM | POA: Insufficient documentation

## 2015-06-12 DIAGNOSIS — R7303 Prediabetes: Secondary | ICD-10-CM

## 2015-06-12 NOTE — Patient Instructions (Addendum)
Talk with provider about sleep Switch out soda for water Eat 3 meals/day and avoid meal skipping Try to increase fruit and vegetables Make healthier choices at fast food. Eat meals at table without distractions!!!!

## 2015-06-12 NOTE — Progress Notes (Signed)
  Medical Nutrition Therapy:  Appt start time: 0915 end time:  0945.   Assessment:  Primary concerns today: [name] is here for nutrition counseling.  She states she has seen a dietitian before, but she states that visit wasn't helpful.  She would like to talk about portion control and healthy food options: things she can eat on the go since she's very busy.  [name] also has an A1c of 5.9%.  There is a family history of diabetes, heart disease, and sleep apnea She does the grocery shopping and cooking for the household.  She does fry most often. She states she also eats out most days:  [employer], [employer]. When at home she "never eats at the table."  She eats while distracted and states she is a slow eater.    Reports poor sleep hygiene: states she's up late and up early.  She tosses and turns and wakes up periodically throughout the night.  She states her provider suggested a sleep study, but [name] is nervous about that.  She was not aware at home sleep study options exist  Preferred Learning Style:   No preference indicated   Learning Readiness:   Ready   MEDICATIONS: see list   DIETARY INTAKE:  Usual eating pattern includes 2-3 meals and 2 snacks per day.  Everyday foods include energy-dense convenience items and sugary beverages.  Avoided foods include "breads".    24-hr recall: fruit/veg 3 days , water 5 days B ( AM): skips  Snk ( AM): none  L ( PM): ate really late Snk ( PM): double cheeseburger and fries at 2:30 D ( PM): 9:30/10 pm slice of pizza Snk ( PM): none Beverages: 2 20-oz soda  Usual physical activity: walks at work 15 minutes/day for about 1 month.    Estimated energy needs: 1800 calories 200 g carbohydrates 135 g protein 50 g fat  Progress Towards Goal(s):  In progress.   Nutritional Diagnosis:  NB-1.7 Undesireable food choices As related to limited consumption fruit/vegetables and daily consumption energy-dense convenience items.  As evidenced by  dietary recall.    Intervention:  Nutrition counseling provided.  Discussed metabolic effects of meal skipping and discouraged this practice.  Recommended 3 meals/day eaten at the table with her family without distractions.  Eat whatever she's fixing for her children.  Recommended daily fruits and vegetables (especially vegetables for the fiber and glucose-lowering effect). Recommended decreasing sugary beverages and increasing water.  Discussed healthier options at fast food restaurants.  Recommended revisiting conversation with her provider about a sleep study to improve her sleep hygiene.    Teaching Method Utilized:  Auditory   Barriers to learning/adherence to lifestyle change: none reported  Demonstrated degree of understanding via:  Teach Back   Monitoring/Evaluation:  Dietary intake, exercise, and body weight in 5 week(s).

## 2015-06-28 ENCOUNTER — Encounter: Payer: Self-pay | Admitting: Family Medicine

## 2015-07-28 ENCOUNTER — Ambulatory Visit: Payer: BLUE CROSS/BLUE SHIELD | Admitting: *Deleted

## 2015-10-30 ENCOUNTER — Ambulatory Visit (INDEPENDENT_AMBULATORY_CARE_PROVIDER_SITE_OTHER): Payer: BLUE CROSS/BLUE SHIELD | Admitting: Family Medicine

## 2015-10-30 ENCOUNTER — Encounter: Payer: Self-pay | Admitting: Family Medicine

## 2015-10-30 ENCOUNTER — Other Ambulatory Visit: Payer: Self-pay | Admitting: Family Medicine

## 2015-10-30 VITALS — BP 128/72 | HR 82 | Resp 18 | Ht 60.0 in | Wt 277.0 lb

## 2015-10-30 DIAGNOSIS — R7303 Prediabetes: Secondary | ICD-10-CM

## 2015-10-30 DIAGNOSIS — R1013 Epigastric pain: Secondary | ICD-10-CM | POA: Diagnosis not present

## 2015-10-30 DIAGNOSIS — Z0000 Encounter for general adult medical examination without abnormal findings: Secondary | ICD-10-CM | POA: Diagnosis not present

## 2015-10-30 DIAGNOSIS — Z23 Encounter for immunization: Secondary | ICD-10-CM

## 2015-10-30 DIAGNOSIS — R1033 Periumbilical pain: Secondary | ICD-10-CM

## 2015-10-30 DIAGNOSIS — Z114 Encounter for screening for human immunodeficiency virus [HIV]: Secondary | ICD-10-CM

## 2015-10-30 LAB — CBC
HCT: 36.5 % (ref 36.0–46.0)
HEMOGLOBIN: 11.7 g/dL — AB (ref 12.0–15.0)
MCH: 27.3 pg (ref 26.0–34.0)
MCHC: 32.1 g/dL (ref 30.0–36.0)
MCV: 85.3 fL (ref 78.0–100.0)
MPV: 10.1 fL (ref 8.6–12.4)
Platelets: 379 10*3/uL (ref 150–400)
RBC: 4.28 MIL/uL (ref 3.87–5.11)
RDW: 14.7 % (ref 11.5–15.5)
WBC: 5.8 10*3/uL (ref 4.0–10.5)

## 2015-10-30 MED ORDER — PHENTERMINE HCL 37.5 MG PO TABS: 37.5000 mg | ORAL_TABLET | Freq: Every day | ORAL | Status: DC

## 2015-10-30 NOTE — Assessment & Plan Note (Signed)
Abdominal and RUQ pain, refer gor gall bladder studies and breath test, also describes reflux symptoms , will start PPI after breath test result back

## 2015-10-30 NOTE — Patient Instructions (Addendum)
F/u in 4 month, call if you need me sooner  Flu vaccine today  Labs today  You are referred for ultrasound of RUQ abdomen, and also need a breath test  1500 caL DIET SHEET TODAY  Phentermine HALF tab dfaily, weight loss goal of 12 pounds in 4 month  It is important that you exercise regularly at least 30 minutes 7 times a week. If you develop chest pain, have severe difficulty breathing, or feel very tired, stop exercising immediately and seek medical attention   Thanks for choosing  Primary Care, we consider it a privelige to serve you.  All the best for 2017!

## 2015-10-30 NOTE — Progress Notes (Signed)
Subjective:    Patient ID: [name], female    DOB: June 10, 1981, 35 y.o.   MRN: [id number]  HPI Patient is in for annual physical exam. Discussed with patient and her spouse marital discord discussed several days before with her husband . He is happy that he has brought it to the fore, and is willing to get therapy through professional counselor. States also last year was particularly stressful for her as she lost 2 grandparents, now ready to focus on her health and feels weight is her major concern, wants to use phentermine half daily for help Recent labs, if available are reviewed. Immunization is reviewed , and  updated if needed. C/o umbilical, RUQ pain, nausea, bloating and reflux symptoms in past approx 6 months , which are bothersome Needs flu vaccine and wants this today    Review of Systems See HPI  Denies abdominal pain  Denies dysuria, frequency, hesitancy or incontinence. Denies joint pain, swelling and limitation in mobility. Denies headaches, seizures, numbness, or tingling.  Denies skin break down or rash.        Objective:   Physical Exam BP 128/72 mmHg  Pulse 82  Resp 18  Ht 5' (1.524 m)  Wt 277 lb (125.646 kg)  BMI 54.10 kg/m2  SpO2 99%   Pleasant  female, alert and oriented x 3, in no cardio-pulmonary distress. Afebrile. HEENT No facial trauma or asymetry. Sinuses non tender.  Extra occullar muscles intact, . External ears normal, tympanic membranes clear. Oropharynx moist, no exudate, fairly good dentition. Neck: supple, no adenopathy,JVD or thyromegaly.No bruits.  Chest: Clear to ascultation bilaterally.No crackles or wheezes. Non tender to palpation  Breast: No asymetry,no masses or lumps. No tenderness. No nipple discharge or inversion. No axillary or supraclavicular adenopathy  Cardiovascular system; Heart sounds normal,  S1 and  S2 ,no S3.  No murmur, or thrill. Apical beat not displaced Peripheral pulses  normal.  Abdomen: Soft, mild epigastric and RUQ tenderness, no organomegaly or masses.No palpable hernia No bruits. Bowel sounds normal. No guarding, tenderness or rebound.  .  GU: External genitalia normal female genitalia , female distribution of hair. No lesions. Urethral meatus normal in size, no  Prolapse, no lesions visibly  Present. Bladder non tender. Vagina pink and moist , with no visible lesions , discharge present . Adequate pelvic support no  cystocele or rectocele noted Cervix pink and appears healthy, no lesions or ulcerations noted, no discharge noted from os Uterus normal size, no adnexal masses, no cervical motion or adnexal tenderness.   Musculoskeletal exam: Full ROM of spine, hips , shoulders and knees. No deformity ,swelling or crepitus noted. No muscle wasting or atrophy.   Neurologic: Cranial nerves 2 to 12 intact. Power, tone ,sensation and reflexes normal throughout. No disturbance in gait. No tremor.  Skin: Intact, no ulceration, erythema , scaling or rash noted. Pigmentation normal throughout  Psych; Normal mood and affect. Judgement and concentration normal. Mild anxiety, not tearful, suicidaal or homicidal       Assessment & Plan:  Annual physical exam Annual exam as documented. Counseling done  re healthy lifestyle involving commitment to 150 minutes exercise per week, heart healthy diet, and attaining healthy weight.The importance of adequate sleep also discussed. Regular seat belt use and home safety, is also discussed. Changes in health habits are decided on by the patient with goals and time frames  set for achieving them. Immunization and cancer screening needs are specifically addressed at this visit.   Dyspepsia  Abdominal and RUQ pain, refer gor gall bladder studies and breath test, also describes reflux symptoms , will start PPI after breath test result back  Umbilical pain No evidence of infection in affected area, no mass  or hernia on exam, will await test results , has had laprascopic surgery, may be scar tissue related  MORBID OBESITY Deteriorated. Patient re-educated about  the importance of commitment to a  minimum of 150 minutes of exercise per week.  The importance of healthy food choices with portion control discussed. Encouraged to start a food diary, count calories and to consider  joining a support group. Sample diet sheets offered. Goals set by the patient for the next several months.   Weight /BMI 10/30/2015 05/01/2015 03/21/2015  WEIGHT 277 lb 275 lb 250 lb  HEIGHT 5\' 0"  5\' 0"  5\' 0"   BMI 54.1 kg/m2 53.71 kg/m2 48.82 kg/m2    Current exercise per week 60 minutes.

## 2015-10-30 NOTE — Assessment & Plan Note (Signed)
Deteriorated. Patient re-educated about  the importance of commitment to a  minimum of 150 minutes of exercise per week.  The importance of healthy food choices with portion control discussed. Encouraged to start a food diary, count calories and to consider  joining a support group. Sample diet sheets offered. Goals set by the patient for the next several months.   Weight /BMI 10/30/2015 05/01/2015 03/21/2015  WEIGHT 277 lb 275 lb 250 lb  HEIGHT 5\' 0"  5\' 0"  5\' 0"   BMI 54.1 kg/m2 53.71 kg/m2 48.82 kg/m2    Current exercise per week 60 minutes.

## 2015-10-30 NOTE — Assessment & Plan Note (Signed)

## 2015-10-30 NOTE — Assessment & Plan Note (Signed)
No evidence of infection in affected area, no mass or hernia on exam, will await test results , has had laprascopic surgery, may be scar tissue related

## 2015-10-31 LAB — H. PYLORI BREATH TEST: H. pylori Breath Test: NOT DETECTED

## 2015-10-31 LAB — HEMOGLOBIN A1C
Hgb A1c MFr Bld: 5.8 % — ABNORMAL HIGH (ref <5.7)
MEAN PLASMA GLUCOSE: 120 mg/dL — AB (ref <117)

## 2015-10-31 LAB — HIV ANTIBODY (ROUTINE TESTING W REFLEX): HIV 1&2 Ab, 4th Generation: NONREACTIVE

## 2015-11-01 ENCOUNTER — Other Ambulatory Visit: Payer: Self-pay

## 2015-11-01 ENCOUNTER — Other Ambulatory Visit: Payer: Self-pay | Admitting: Family Medicine

## 2015-11-01 LAB — FERRITIN: Ferritin: 119 ng/mL (ref 10–291)

## 2015-11-01 LAB — IRON: IRON: 57 ug/dL (ref 40–190)

## 2015-11-01 MED ORDER — PANTOPRAZOLE SODIUM 40 MG PO TBEC: 40.0000 mg | DELAYED_RELEASE_TABLET | Freq: Every day | ORAL | Status: DC

## 2015-11-06 ENCOUNTER — Ambulatory Visit (HOSPITAL_COMMUNITY)
Admission: RE | Admit: 2015-11-06 | Discharge: 2015-11-06 | Disposition: A | Discharge status: Home / Self Care | Payer: BLUE CROSS/BLUE SHIELD | Source: Ambulatory Visit | Attending: Family Medicine | Admitting: Family Medicine

## 2015-11-06 DIAGNOSIS — R1013 Epigastric pain: Secondary | ICD-10-CM | POA: Diagnosis not present

## 2016-02-26 ENCOUNTER — Encounter: Payer: Self-pay | Admitting: Family Medicine

## 2016-02-26 ENCOUNTER — Ambulatory Visit (INDEPENDENT_AMBULATORY_CARE_PROVIDER_SITE_OTHER): Payer: BLUE CROSS/BLUE SHIELD | Admitting: Family Medicine

## 2016-02-26 VITALS — BP 118/80 | HR 89 | Resp 16 | Ht 60.0 in | Wt 275.0 lb

## 2016-02-26 DIAGNOSIS — F411 Generalized anxiety disorder: Secondary | ICD-10-CM | POA: Diagnosis not present

## 2016-02-26 DIAGNOSIS — R7303 Prediabetes: Secondary | ICD-10-CM

## 2016-02-26 DIAGNOSIS — E785 Hyperlipidemia, unspecified: Secondary | ICD-10-CM

## 2016-02-26 DIAGNOSIS — E8881 Metabolic syndrome: Secondary | ICD-10-CM | POA: Diagnosis not present

## 2016-02-26 DIAGNOSIS — N920 Excessive and frequent menstruation with regular cycle: Secondary | ICD-10-CM

## 2016-02-26 DIAGNOSIS — R1013 Epigastric pain: Secondary | ICD-10-CM

## 2016-02-26 MED ORDER — PHENTERMINE HCL 37.5 MG PO TABS: 37.5000 mg | ORAL_TABLET | Freq: Every day | ORAL | Status: DC

## 2016-02-26 NOTE — Patient Instructions (Addendum)
F/u in 4 month, call if you need me  Before  1500 cal diet sheet  And mason jar of vegetable or fruit at each meal  Will be given  Commit to eating on schedule, and make  it plant based  Weight loss goal of 16 to 20 pounds is achievable  Half phentermine daily  CBc, fasting chem 7, lipid, tSH, hBA1C and vit D in 4 month  Thank you  for choosing  Primary Care. We consider it a privelige to serve you.  Delivering excellent health care in a caring and  compassionate way is our goal.  Partnering with you,  so that together we can achieve this goal is our strategy.

## 2016-02-26 NOTE — Progress Notes (Signed)
Subjective:    Patient ID: [name], female    DOB: 1981-09-13, 35 y.o.   MRN: [id number]  HPI    [name]     MRN: [id number]      DOB: [birth year]   HPI [name] is here for follow up and re-evaluation of chronic medical conditions, medication management and review of any available recent lab and radiology data.  Preventive health is updated, specifically  Cancer screening and Immunization.   Questions or concerns regarding consultations or procedures which the PT has had in the interim are  addressed. The PT denies any adverse reactions to current medications since the last visit.  There are no new concerns.  There are no specific complaints   ROS Denies recent fever or chills. Denies sinus pressure, nasal congestion, ear pain or sore throat. Denies chest congestion, productive cough or wheezing. Denies chest pains, palpitations and leg swelling Denies abdominal pain, nausea, vomiting,diarrhea or constipation.   Denies dysuria, frequency, hesitancy or incontinence. Denies joint pain, swelling and limitation in mobility. Denies headaches, seizures, numbness, or tingling. Denies depression, anxiety or insomnia. Denies skin break down or rash.   PE  BP 118/80 mmHg  Pulse 89  Resp 16  Ht 5' (1.524 m)  Wt 275 lb (124.739 kg)  BMI 53.71 kg/m2  SpO2 99%  Patient alert and oriented and in no cardiopulmonary distress.  HEENT: No facial asymmetry, EOMI,   oropharynx pink and moist.  Neck supple no JVD, no mass.  Chest: Clear to auscultation bilaterally.  CVS: S1, S2 no murmurs, no S3.Regular rate.  ABD: Soft non tender.   Ext: No edema  MS: Adequate ROM spine, shoulders, hips and knees.  Skin: Intact, no ulcerations or rash noted.  Psych: Good eye contact, normal affect. Memory intact not anxious or depressed appearing.  CNS: CN 2-12 intact, power,  normal throughout.no focal deficits noted.   Assessment & Plan  MORBID  OBESITY Deteriorated. Patient re-educated about  the importance of commitment to a  minimum of 150 minutes of exercise per week.  The importance of healthy food choices with portion control discussed. Encouraged to start a food diary, count calories and to consider  joining a support group. Sample diet sheets offered. Goals set by the patient for the next several months.   Weight /BMI 02/26/2016 10/30/2015 05/01/2015  WEIGHT 275 lb 277 lb 275 lb  HEIGHT     BMI 53.71 kg/m2 54.1 kg/m2 53.71 kg/m2    Current exercise per week 60 minutes.   GAD (generalized anxiety disorder) Controlled, no change in medication Recent panic attack but not as severe  Prediabetes Patient educated about the importance of limiting  Carbohydrate intake , the need to commit to daily physical activity for a minimum of 30 minutes , and to commit weight loss. The fact that changes in all these areas will reduce or eliminate all together the development of diabetes is stressed.  improved  Diabetic Labs Latest Ref Rng 10/30/2015 02/06/2015 06/04/2014 10/30/2013 07/17/2013  HbA1c <5.7 % 5.8(H) 5.9(H) 5.9(H) 6.0(H) 6.0(H)  Chol 0 - 200 mg/dL - 562 130 865 -  HDL >=78 mg/dL - 46(N) 44 46 -  Calc LDL 0 - 99 mg/dL - 91 78 629(B) -  Triglycerides <150 mg/dL - 50 65 65 -  Creatinine 0.50 - 1.10 mg/dL - 2.84 - 1.32 -   BP/Weight 02/26/2016 10/30/2015 05/01/2015 03/21/2015 02/07/2015 09/16/2014 06/06/2014  Systolic BP 118 128 118 141 122  120 118  Diastolic BP 80 72 80 95 88 76 82  Wt. (Lbs) 275 277 275 250 274.08 273.04 273.12  BMI 53.71 54.1 53.71 48.82 53.53 53.32 53.34   No flowsheet data found.     Dyspepsia Controlled, no change in medication   Dyslipidemia Hyperlipidemia:Low fat diet discussed and encouraged.   Lipid Panel  Lab Results  Component Value Date   CHOL 143 02/06/2015   HDL 42* 02/06/2015   LDLCALC 91 02/06/2015   TRIG 50 02/06/2015   CHOLHDL 3.4 02/06/2015   Updated lab needed  at/ before next visit.      Metabolic syndrome X The increased risk of cardiovascular disease associated with this diagnosis, and the need to consistently work on lifestyle to change this is discussed. Following  a  heart healthy diet ,commitment to 30 minutes of exercise at least 5 days per week, as well as control of blood sugar and cholesterol , and achieving a healthy weight are all the areas to be addressed .   Heavy menses improved      Review of Systems     Objective:   Physical Exam        Assessment & Plan:

## 2016-03-03 NOTE — Assessment & Plan Note (Signed)
Deteriorated. Patient re-educated about  the importance of commitment to a  minimum of 150 minutes of exercise per week.  The importance of healthy food choices with portion control discussed. Encouraged to start a food diary, count calories and to consider  joining a support group. Sample diet sheets offered. Goals set by the patient for the next several months.   Weight /BMI 02/26/2016 10/30/2015 05/01/2015  WEIGHT 275 lb 277 lb 275 lb  HEIGHT 5\' 0"  5\' 0"  5\' 0"   BMI 53.71 kg/m2 54.1 kg/m2 53.71 kg/m2    Current exercise per week 60 minutes.

## 2016-03-03 NOTE — Assessment & Plan Note (Signed)
Hyperlipidemia:Low fat diet discussed and encouraged.   Lipid Panel  Lab Results  Component Value Date   CHOL 143 02/06/2015   HDL 42* 02/06/2015   LDLCALC 91 02/06/2015   TRIG 50 02/06/2015   CHOLHDL 3.4 02/06/2015   Updated lab needed at/ before next visit.

## 2016-03-03 NOTE — Assessment & Plan Note (Signed)
Controlled, no change in medication Recent panic attack but not as severe

## 2016-03-03 NOTE — Assessment & Plan Note (Signed)
improved

## 2016-03-03 NOTE — Assessment & Plan Note (Signed)
Controlled, no change in medication  

## 2016-03-03 NOTE — Assessment & Plan Note (Signed)
The increased risk of cardiovascular disease associated with this diagnosis, and the need to consistently work on lifestyle to change this is discussed. Following  a  heart healthy diet ,commitment to 30 minutes of exercise at least 5 days per week, as well as control of blood sugar and cholesterol , and achieving a healthy weight are all the areas to be addressed .  

## 2016-03-03 NOTE — Assessment & Plan Note (Signed)
Patient educated about the importance of limiting  Carbohydrate intake , the need to commit to daily physical activity for a minimum of 30 minutes , and to commit weight loss. The fact that changes in all these areas will reduce or eliminate all together the development of diabetes is stressed.  improved  Diabetic Labs Latest Ref Rng 10/30/2015 02/06/2015 06/04/2014 10/30/2013 07/17/2013  HbA1c <5.7 % 5.8(H) 5.9(H) 5.9(H) 6.0(H) 6.0(H)  Chol 0 - 200 mg/dL - 562143 130135 865161 -  HDL >=78>=46 mg/dL - 46(N42(L) 44 46 -  Calc LDL 0 - 99 mg/dL - 91 78 629(B102(H) -  Triglycerides <150 mg/dL - 50 65 65 -  Creatinine 0.50 - 1.10 mg/dL - 2.840.93 - 1.320.96 -   BP/Weight 02/26/2016 10/30/2015 05/01/2015 03/21/2015 02/07/2015 09/16/2014 06/06/2014  Systolic BP 118 128 118 141 122 120 118  Diastolic BP 80 72 80 95 88 76 82  Wt. (Lbs) 275 277 275 250 274.08 273.04 273.12  BMI 53.71 54.1 53.71 48.82 53.53 53.32 53.34   No flowsheet data found.

## 2016-04-18 ENCOUNTER — Other Ambulatory Visit: Payer: Self-pay | Admitting: Family Medicine

## 2016-06-14 ENCOUNTER — Ambulatory Visit: Payer: BLUE CROSS/BLUE SHIELD | Admitting: Family Medicine

## 2016-07-13 LAB — BASIC METABOLIC PANEL
BUN: 12 mg/dL (ref 7–25)
CO2: 25 mmol/L (ref 20–31)
Calcium: 8.8 mg/dL (ref 8.6–10.2)
Chloride: 105 mmol/L (ref 98–110)
Creat: 1.05 mg/dL (ref 0.50–1.10)
Glucose, Bld: 96 mg/dL (ref 65–99)
Potassium: 4.7 mmol/L (ref 3.5–5.3)
SODIUM: 139 mmol/L (ref 135–146)

## 2016-07-13 LAB — TSH: TSH: 0.86 m[IU]/L

## 2016-07-13 LAB — CBC
HCT: 36.8 % (ref 35.0–45.0)
HEMOGLOBIN: 11.9 g/dL (ref 11.7–15.5)
MCH: 27.2 pg (ref 27.0–33.0)
MCHC: 32.3 g/dL (ref 32.0–36.0)
MCV: 84 fL (ref 80.0–100.0)
MPV: 9.7 fL (ref 7.5–12.5)
Platelets: 374 10*3/uL (ref 140–400)
RBC: 4.38 MIL/uL (ref 3.80–5.10)
RDW: 13.7 % (ref 11.0–15.0)
WBC: 5.4 10*3/uL (ref 3.8–10.8)

## 2016-07-13 LAB — LIPID PANEL
CHOL/HDL RATIO: 3.4 ratio (ref <=5.0)
Cholesterol: 145 mg/dL (ref 125–200)
HDL: 43 mg/dL — AB (ref >=46)
LDL CALC: 88 mg/dL (ref <130)
Triglycerides: 71 mg/dL (ref <150)
VLDL: 14 mg/dL (ref <30)

## 2016-07-14 LAB — HEMOGLOBIN A1C
HEMOGLOBIN A1C: 5.7 % — AB (ref <5.7)
MEAN PLASMA GLUCOSE: 117 mg/dL

## 2016-07-15 ENCOUNTER — Other Ambulatory Visit: Payer: Self-pay

## 2016-07-15 LAB — VITAMIN D 25 HYDROXY (VIT D DEFICIENCY, FRACTURES): Vit D, 25-Hydroxy: 9 ng/mL — ABNORMAL LOW (ref 30–100)

## 2016-07-15 MED ORDER — VITAMIN D (ERGOCALCIFEROL) 1.25 MG (50000 UNIT) PO CAPS: 50000.0000 [IU] | ORAL_CAPSULE | ORAL | 1 refills | Status: DC

## 2016-07-19 ENCOUNTER — Ambulatory Visit: Payer: BLUE CROSS/BLUE SHIELD | Admitting: Family Medicine

## 2016-08-05 ENCOUNTER — Ambulatory Visit (INDEPENDENT_AMBULATORY_CARE_PROVIDER_SITE_OTHER): Payer: BLUE CROSS/BLUE SHIELD | Admitting: Family Medicine

## 2016-08-05 ENCOUNTER — Encounter: Payer: Self-pay | Admitting: Family Medicine

## 2016-08-05 VITALS — BP 118/82 | HR 87 | Ht 60.0 in | Wt 279.0 lb

## 2016-08-05 DIAGNOSIS — Z23 Encounter for immunization: Secondary | ICD-10-CM

## 2016-08-05 DIAGNOSIS — E559 Vitamin D deficiency, unspecified: Secondary | ICD-10-CM

## 2016-08-05 DIAGNOSIS — R1013 Epigastric pain: Secondary | ICD-10-CM

## 2016-08-05 DIAGNOSIS — R7303 Prediabetes: Secondary | ICD-10-CM

## 2016-08-05 DIAGNOSIS — E785 Hyperlipidemia, unspecified: Secondary | ICD-10-CM | POA: Diagnosis not present

## 2016-08-05 DIAGNOSIS — F411 Generalized anxiety disorder: Secondary | ICD-10-CM

## 2016-08-05 DIAGNOSIS — F410 Panic disorder [episodic paroxysmal anxiety] without agoraphobia: Secondary | ICD-10-CM

## 2016-08-05 MED ORDER — ORLISTAT 120 MG PO CAPS: 120.0000 mg | ORAL_CAPSULE | Freq: Three times a day (TID) | ORAL | 4 refills | Status: DC

## 2016-08-05 NOTE — Assessment & Plan Note (Signed)
Improved, not using pPI

## 2016-08-05 NOTE — Patient Instructions (Signed)
Annual physical exam first week in Feb, call if you need me before  Flu vaccine today  Please work on good  health habits so that your health will improve. 1. Commitment to daily physical activity for 30 to 60  minutes, if you are able to do this.  2. Commitment to wise food choices. Aim for half of your  food intake to be vegetable and fruit, one quarter starchy foods, and one quarter protein. Try to eat on a regular schedule  3 meals per day, snacking between meals should be limited to vegetables or fruits or small portions of nuts. 64 ounces of water per day is generally recommended, unless you have specific health conditions, like heart failure or kidney failure where you will need to limit fluid intake.  3. Commitment to sufficient and a  good quality of physical and mental rest daily, generally between 6 to 8 hours per day.  WITH PERSISTANCE AND PERSEVERANCE, THE IMPOSSIBLE , BECOMES THE NORM!   NEW for help with weight loss is orlistat, this will help you with food choice, healthier and to facilitate weight loss

## 2016-08-05 NOTE — Progress Notes (Signed)
Misty StanleyShamica D [name]     MRN: [id number]      DOB: 07-Dec-1980   HPI [name] is here for follow up and re-evaluation of chronic medical conditions, medication management and review of any available recent lab and radiology data.  Preventive health is updated, specifically  Cancer screening and Immunization.   Questions or concerns regarding consultations or procedures which the PT has had in the interim are  addressed. The PT denies any adverse reactions to current medications since the last visit.  There are no new concerns.  There are no specific complaints   ROS Denies recent fever or chills. Denies sinus pressure, nasal congestion, ear pain or sore throat. Denies chest congestion, productive cough or wheezing. Denies chest pains, palpitations and leg swelling Denies abdominal pain, nausea, vomiting,diarrhea or constipation.   Denies dysuria, frequency, hesitancy or incontinence. Denies joint pain, swelling and limitation in mobility. Denies headaches, seizures, numbness, or tingling. Denies depression, anxiety or insomnia. Denies skin break down or rash.   PE  BP 118/82   Pulse 87   Ht 5' (1.524 m)   Wt 279 lb (126.6 kg)   SpO2 98%   BMI 54.49 kg/m   Patient alert and oriented and in no cardiopulmonary distress.  HEENT: No facial asymmetry, EOMI,   oropharynx pink and moist.  Neck supple no JVD, no mass.  Chest: Clear to auscultation bilaterally.  CVS: S1, S2 no murmurs, no S3.Regular rate.  ABD: Soft non tender.   Ext: No edema  MS: Adequate ROM spine, shoulders, hips and knees.  Skin: Intact, no ulcerations or rash noted.  Psych: Good eye contact, normal affect. Memory intact not anxious or depressed appearing.  CNS: CN 2-12 intact, power,  normal throughout.no focal deficits noted.   Assessment & Plan  MORBID OBESITY Deteriorated. Patient re-educated about  the importance of commitment to a  minimum of 150 minutes of exercise per week.  The  importance of healthy food choices with portion control discussed. Encouraged to start a food diary, count calories and to consider  joining a support group. Sample diet sheets offered. Goals set by the patient for the next several months.   Weight /BMI 08/05/2016 02/26/2016 10/30/2015  WEIGHT 279 lb 275 lb 277 lb  HEIGHT 5\' 0"  5\' 0"  5\' 0"   BMI 54.49 kg/m2 53.71 kg/m2 54.1 kg/m2      GAD (generalized anxiety disorder) Controlled, no change in medication   Dyslipidemia Needs to commit to more regular exercise  Dyspepsia Improved, not using pPI  Vitamin D deficiency Needs to start weekly vit D  Prediabetes Imp[roved Patient educated about the importance of limiting  Carbohydrate intake , the need to commit to daily physical activity for a minimum of 30 minutes , and to commit weight loss. The fact that changes in all these areas will reduce or eliminate all together the development of diabetes is stressed.   Diabetic Labs Latest Ref Rng & Units 07/13/2016 10/30/2015 02/06/2015 06/04/2014 10/30/2013  HbA1c <5.7 % 5.7(H) 5.8(H) 5.9(H) 5.9(H) 6.0(H)  Chol 125 - 200 mg/dL 409145 - 811143 914135 782161  HDL >=46 mg/dL 95(A43(L) - 21(H42(L) 44 46  Calc LDL <130 mg/dL 88 - 91 78 086(V102(H)  Triglycerides <150 mg/dL 71 - 50 65 65  Creatinine 0.50 - 1.10 mg/dL 7.841.05 - 6.960.93 - 2.950.96   BP/Weight 08/05/2016 02/26/2016 10/30/2015 05/01/2015 03/21/2015 02/07/2015 09/16/2014  Systolic BP 118 118 128 118 141 122 120  Diastolic BP 82 80 72 80 95 88  76  Wt. (Lbs) 279 275 277 275 250 274.08 273.04  BMI 54.49 53.71 54.1 53.71 48.82 53.53 53.32   No flowsheet data found.    Need for prophylactic vaccination and inoculation against influenza After obtaining informed consent, the vaccine is  administered by LPN.   Panic attack Reports one in past 1 month, increased stress raising children

## 2016-08-05 NOTE — Assessment & Plan Note (Signed)
Deteriorated. Patient re-educated about  the importance of commitment to a  minimum of 150 minutes of exercise per week.  The importance of healthy food choices with portion control discussed. Encouraged to start a food diary, count calories and to consider  joining a support group. Sample diet sheets offered. Goals set by the patient for the next several months.   Weight /BMI 08/05/2016 02/26/2016 10/30/2015  WEIGHT 279 lb 275 lb 277 lb  HEIGHT 5\' 0"  5\' 0"  5\' 0"   BMI 54.49 kg/m2 53.71 kg/m2 54.1 kg/m2

## 2016-08-05 NOTE — Assessment & Plan Note (Signed)
After obtaining informed consent, the vaccine is  administered by LPN.  

## 2016-08-05 NOTE — Assessment & Plan Note (Signed)
Imp[roved Patient educated about the importance of limiting  Carbohydrate intake , the need to commit to daily physical activity for a minimum of 30 minutes , and to commit weight loss. The fact that changes in all these areas will reduce or eliminate all together the development of diabetes is stressed.   Diabetic Labs Latest Ref Rng & Units 07/13/2016 10/30/2015 02/06/2015 06/04/2014 10/30/2013  HbA1c <5.7 % 5.7(H) 5.8(H) 5.9(H) 5.9(H) 6.0(H)  Chol 125 - 200 mg/dL 952145 - 841143 324135 401161  HDL >=46 mg/dL 02(V43(L) - 25(D42(L) 44 46  Calc LDL <130 mg/dL 88 - 91 78 664(Q102(H)  Triglycerides <150 mg/dL 71 - 50 65 65  Creatinine 0.50 - 1.10 mg/dL 0.341.05 - 7.420.93 - 5.950.96   BP/Weight 08/05/2016 02/26/2016 10/30/2015 05/01/2015 03/21/2015 02/07/2015 09/16/2014  Systolic BP 118 118 128 118 141 122 120  Diastolic BP 82 80 72 80 95 88 76  Wt. (Lbs) 279 275 277 275 250 274.08 273.04  BMI 54.49 53.71 54.1 53.71 48.82 53.53 53.32   No flowsheet data found.

## 2016-08-05 NOTE — Assessment & Plan Note (Signed)
Needs to commit to more regular exercise

## 2016-08-05 NOTE — Assessment & Plan Note (Signed)
Controlled, no change in medication  

## 2016-08-05 NOTE — Assessment & Plan Note (Signed)
Needs to start weekly vit D 

## 2016-08-05 NOTE — Assessment & Plan Note (Signed)
Reports one in past 1 month, increased stress raising children

## 2016-08-08 ENCOUNTER — Telehealth: Payer: Self-pay | Admitting: Family Medicine

## 2016-08-08 NOTE — Telephone Encounter (Signed)
Pls let her know that no weight loss drugs are covered by her plan, ALI is the oTC drug similar to xenical which is out of pocket.  phentermine had no effect on her weight loss  And will not be prescribed as we discussed, risk with no benefit for her

## 2016-08-09 NOTE — Telephone Encounter (Signed)
Patient aware.

## 2016-08-15 ENCOUNTER — Ambulatory Visit (INDEPENDENT_AMBULATORY_CARE_PROVIDER_SITE_OTHER): Payer: BLUE CROSS/BLUE SHIELD | Admitting: Family Medicine

## 2016-08-15 ENCOUNTER — Telehealth: Payer: Self-pay | Admitting: Family Medicine

## 2016-08-15 ENCOUNTER — Encounter: Payer: Self-pay | Admitting: Family Medicine

## 2016-08-15 VITALS — BP 126/82 | HR 108 | Temp 98.2°F | Resp 18 | Ht 60.0 in | Wt 279.0 lb

## 2016-08-15 DIAGNOSIS — N10 Acute tubulo-interstitial nephritis: Secondary | ICD-10-CM | POA: Diagnosis not present

## 2016-08-15 NOTE — Telephone Encounter (Signed)
Letter printed.

## 2016-08-15 NOTE — Patient Instructions (Signed)
Drink extra water / fluids Take the antibiotic as directed 3 x a day Use the nausea medicine as needed I will check on the hospital lab tests Need to come back in 2 weeks for re check and a urine test to make sure the infection is gone

## 2016-08-15 NOTE — Progress Notes (Signed)
Chief Complaint  Patient presents with  . Follow-up    er   Here for ER follow up.  Records not available - patient brought her d/c papers. Seen yesterday for 2 d of flank pain and shaking chills Had nausea and vomiting so [facility] Found to have abnormal urine analysis and elevated WBC count No imaging She does not have history of kidney infection or UTI, had one bladder infection while pregnant 10 y ago.  She did not have urine symptoms or frequency, no noted hematuria.  Is not pregnant.  Patient Active Problem List   Diagnosis Date Noted  . Vitamin D deficiency 08/05/2016  . Dyspepsia 10/30/2015  . Need for prophylactic vaccination and inoculation against influenza 06/20/2014  . Metabolic syndrome X 03/21/2014  . Dyslipidemia 03/21/2014  . GAD (generalized anxiety disorder) 03/03/2014  . Panic attack 03/03/2014  . Prediabetes 05/20/2012  . MORBID OBESITY 12/27/2009  . ALLERGIC RHINITIS CAUSE UNSPECIFIED 12/27/2009    Outpatient Encounter Prescriptions as of 08/15/2016  Medication Sig  . busPIRone (BUSPAR) 5 MG tablet TAKE ONE TABLET BY MOUTH THREE TIMES DAILY  . cephALEXin (KEFLEX) 500 MG capsule Take 500 mg by mouth 3 (three) times daily.  . ondansetron (ZOFRAN) 4 MG tablet Take 4 mg by mouth every 8 (eight) hours as needed for nausea or vomiting.  . Vitamin D, Ergocalciferol, (DRISDOL) 50000 units CAPS capsule Take 1 capsule (50,000 Units total) by mouth every 7 (seven) days.   No facility-administered encounter medications on file as of 08/15/2016.     Allergies  Allergen Reactions  . Hydrocodone Itching  . Penicillins Hives    Review of Systems  Constitutional: Positive for chills, fatigue and fever.  Gastrointestinal: Positive for abdominal pain, nausea and vomiting.  Genitourinary: Positive for flank pain. Negative for dysuria, urgency, vaginal bleeding and vaginal discharge.  Musculoskeletal: Positive for back pain.  Neurological: Negative  for dizziness and headaches.  All other systems reviewed and are negative.   BP 126/82 (BP Location: Right Wrist, Patient Position: Sitting, Cuff Size: Normal)   Pulse (!) 108   Temp 98.2 F (36.8 C) (Oral)   Resp 18   Ht 5' (1.524 m)   Wt 279 lb (126.6 kg)   LMP 08/08/2016   SpO2 99%   BMI 54.49 kg/m   Physical Exam  Constitutional: She is oriented to person, place, and time. She appears well-developed and well-nourished. No distress.  Overnourished, obese  HENT:  Head: Normocephalic and atraumatic.  Right Ear: External ear normal.  Left Ear: External ear normal.  Mouth/Throat: Oropharynx is clear and moist.  Eyes: Conjunctivae are normal. Pupils are equal, round, and reactive to light.  Neck: Normal range of motion. Neck supple. No thyromegaly present.  Cardiovascular: Normal rate, regular rhythm and normal heart sounds.   Pulmonary/Chest: Effort normal and breath sounds normal. No respiratory distress.  Abdominal: Soft. Bowel sounds are normal. There is tenderness. There is CVA tenderness.  tenderness deep palp R middle abdomen  Musculoskeletal: Normal range of motion. She exhibits no edema.  Lymphadenopathy:    She has no cervical adenopathy.  Neurological: She is oriented to person, place, and time.  Psychiatric: She has a normal mood and affect. Her behavior is normal. Thought content normal.    ASSESSMENT/PLAN:  1. Acute pyelonephritis The symptoms of chills and fever, nausea and vomiting, and flank pain with abnormal UA and elevated WBC make this more likely to be pyelonephritis than simple cystitis.  Needs 10 d antibiotics. She is on keflex, and this will be changed based on sensitivity if indicated.  Need to check culture.  Need follow up and UA for TOC.  discussed with patient.   Patient Instructions  Drink extra water / fluids Take the antibiotic as directed 3 x a day Use the nausea medicine as needed I will check on the hospital lab tests Need to come back  in 2 weeks for re check and a urine test to make sure the infection is gone   Eustace MooreYvonne Sue Audry Pecina, MD

## 2016-08-27 ENCOUNTER — Ambulatory Visit (INDEPENDENT_AMBULATORY_CARE_PROVIDER_SITE_OTHER): Payer: BLUE CROSS/BLUE SHIELD | Admitting: Family Medicine

## 2016-08-27 ENCOUNTER — Encounter: Payer: Self-pay | Admitting: Family Medicine

## 2016-08-27 VITALS — BP 134/86 | HR 84 | Temp 98.5°F | Resp 16 | Ht 60.0 in | Wt 278.0 lb

## 2016-08-27 DIAGNOSIS — N12 Tubulo-interstitial nephritis, not specified as acute or chronic: Secondary | ICD-10-CM

## 2016-08-27 LAB — POCT URINALYSIS DIPSTICK
BILIRUBIN UA: NEGATIVE
Blood, UA: NEGATIVE
GLUCOSE UA: NEGATIVE
KETONES UA: NEGATIVE
Leukocytes, UA: NEGATIVE
NITRITE UA: NEGATIVE
PH UA: 7
Protein, UA: NEGATIVE
Spec Grav, UA: 1.015
Urobilinogen, UA: 0.2

## 2016-08-27 NOTE — Progress Notes (Signed)
    Chief Complaint  Patient presents with  . Follow-up    2 week   Follow up after Tx for pyelonephritis. She was compliant with the 10 d of keflex.  The urine culture confirmed sensitivity. She has no symptoms of urinary frequency or dysuria,  No flank pain.  No nausea or fever. Asymptomatic at this time.  Needs a work note.  Patient Active Problem List   Diagnosis Date Noted  . Vitamin D deficiency 08/05/2016  . Dyspepsia 10/30/2015  . Need for prophylactic vaccination and inoculation against influenza 06/20/2014  . Metabolic syndrome X 03/21/2014  . Dyslipidemia 03/21/2014  . GAD (generalized anxiety disorder) 03/03/2014  . Panic attack 03/03/2014  . Prediabetes 05/20/2012  . MORBID OBESITY 12/27/2009  . ALLERGIC RHINITIS CAUSE UNSPECIFIED 12/27/2009    Outpatient Encounter Prescriptions as of 08/27/2016  Medication Sig  . busPIRone (BUSPAR) 5 MG tablet TAKE ONE TABLET BY MOUTH THREE TIMES DAILY  . cholecalciferol (VITAMIN D) 1000 units tablet Take 2,000 Units by mouth daily.   No facility-administered encounter medications on file as of 08/27/2016.     Allergies  Allergen Reactions  . Hydrocodone Itching  . Penicillins Hives    Review of Systems  Constitutional: Negative for activity change, appetite change, chills and fever.  Gastrointestinal: Negative for abdominal pain, diarrhea, nausea and vomiting.  Genitourinary: Negative for difficulty urinating, dysuria, flank pain, frequency and hematuria.  Musculoskeletal: Negative for back pain.    BP 134/86 (BP Location: Right Wrist, Patient Position: Sitting, Cuff Size: Normal)   Pulse 84   Temp 98.5 F (36.9 C) (Oral)   Resp 16   Ht 5' (1.524 m)   Wt 278 lb (126.1 kg)   LMP 08/08/2016   SpO2 100%   BMI 54.29 kg/m   Physical Exam  Constitutional: She appears well-developed and well-nourished. No distress.  obese  HENT:  Head: Normocephalic and atraumatic.  Mouth/Throat: Oropharynx is clear and moist.    Eyes: Conjunctivae are normal. Pupils are equal, round, and reactive to light.  Neck: Normal range of motion. Neck supple.  Cardiovascular: Normal rate, regular rhythm and normal heart sounds.   Pulmonary/Chest: Effort normal and breath sounds normal.  Abdominal: Soft. Bowel sounds are normal. There is no tenderness.  No flank pain  Psychiatric: She has a normal mood and affect. Her behavior is normal.   UA dipstick is negative  ASSESSMENT/PLAN:  1. Pyelonephritis - POCT Urinalysis Dipstick   Patient Instructions  Continue to drink plenty of water Call or return if you have any recurrence of symptoms, or increasing back pain   Eustace MooreYvonne Sue Florence Antonelli, MD

## 2016-08-27 NOTE — Patient Instructions (Signed)
Continue to drink plenty of water Call or return if you have any recurrence of symptoms, or increasing back pain

## 2016-08-29 ENCOUNTER — Encounter: Payer: Self-pay | Admitting: Family Medicine

## 2016-11-18 ENCOUNTER — Encounter: Payer: Self-pay | Admitting: Family Medicine

## 2016-11-18 ENCOUNTER — Ambulatory Visit (INDEPENDENT_AMBULATORY_CARE_PROVIDER_SITE_OTHER): Payer: BLUE CROSS/BLUE SHIELD | Admitting: Family Medicine

## 2016-11-18 ENCOUNTER — Other Ambulatory Visit (HOSPITAL_COMMUNITY)
Admission: RE | Admit: 2016-11-18 | Discharge: 2016-11-18 | Disposition: A | Discharge status: Home / Self Care | Payer: BLUE CROSS/BLUE SHIELD | Source: Ambulatory Visit | Attending: Family Medicine | Admitting: Family Medicine

## 2016-11-18 VITALS — BP 134/84 | HR 97 | Resp 14 | Ht 60.0 in | Wt 273.0 lb

## 2016-11-18 DIAGNOSIS — Z0000 Encounter for general adult medical examination without abnormal findings: Secondary | ICD-10-CM

## 2016-11-18 DIAGNOSIS — E559 Vitamin D deficiency, unspecified: Secondary | ICD-10-CM | POA: Diagnosis not present

## 2016-11-18 DIAGNOSIS — Z124 Encounter for screening for malignant neoplasm of cervix: Secondary | ICD-10-CM

## 2016-11-18 DIAGNOSIS — R7303 Prediabetes: Secondary | ICD-10-CM

## 2016-11-18 DIAGNOSIS — Z1151 Encounter for screening for human papillomavirus (HPV): Secondary | ICD-10-CM | POA: Diagnosis not present

## 2016-11-18 DIAGNOSIS — Z01419 Encounter for gynecological examination (general) (routine) without abnormal findings: Secondary | ICD-10-CM | POA: Insufficient documentation

## 2016-11-18 DIAGNOSIS — M25562 Pain in left knee: Secondary | ICD-10-CM | POA: Diagnosis not present

## 2016-11-18 MED ORDER — BUSPIRONE HCL 5 MG PO TABS: 5.0000 mg | ORAL_TABLET | Freq: Three times a day (TID) | ORAL | 5 refills | Status: DC

## 2016-11-18 MED ORDER — PREDNISONE 5 MG PO TABS: 5.0000 mg | ORAL_TABLET | Freq: Two times a day (BID) | ORAL | 0 refills | Status: AC

## 2016-11-18 MED ORDER — IBUPROFEN 800 MG PO TABS: 800.0000 mg | ORAL_TABLET | Freq: Three times a day (TID) | ORAL | 0 refills | Status: DC

## 2016-11-18 NOTE — Assessment & Plan Note (Signed)
2 day history, no trauma, short course of anti inflammatory

## 2016-11-18 NOTE — Patient Instructions (Addendum)
F/u in 5 months, call if  You need me before  Vit D and hBA1C in 5 month  Weight loss goal of 10 to 15 pounds  Buspar and 5 day course of anti inflammatory for knee pain are sent  Ok to take Es tylenol one daily for knee pain  Pls commit to daily vit D and daily multivitamin  It is important that you exercise regularly at least 30 minutes 5 times a week. If you develop chest pain, have severe difficulty breathing, or feel very tired, stop exercising immediately and seek medical attention    Please work on good  health habits so that your health will improve. 1. Commitment to daily physical activity for 30 to 60  minutes, if you are able to do this.  2. Commitment to wise food choices. Aim for half of your  food intake to be vegetable and fruit, one quarter starchy foods, and one quarter protein. Try to eat on a regular schedule  3 meals per day, snacking between meals should be limited to vegetables or fruits or small portions of nuts. 64 ounces of water per day is generally recommended, unless you have specific health conditions, like heart failure or kidney failure where you will need to limit fluid intake.  3. Commitment to sufficient and a  good quality of physical and mental rest daily, generally between 6 to 8 hours per day.  WITH PERSISTANCE AND PERSEVERANCE, THE IMPOSSIBLE , BECOMES THE NORM!  It is important that you exercise regularly at least 30 minutes 5 times a week. If you develop chest pain, have severe difficulty breathing, or feel very tired, stop exercising immediately and seek medical attention

## 2016-11-18 NOTE — Addendum Note (Signed)
Addended by: Anthoney HaradaSLADE, Elbia Paro B on: 11/18/2016 08:43 AM   Modules accepted: Orders

## 2016-11-18 NOTE — Assessment & Plan Note (Signed)

## 2016-11-18 NOTE — Addendum Note (Signed)
Addended by: Abner GreenspanHUDY, BRANDI H on: 11/18/2016 08:49 AM   Modules accepted: Orders

## 2016-11-18 NOTE — Progress Notes (Signed)
    [name]     MRN: [id number]      DOB: [birth year]  HPI: Patient is in for annual physical exam. No other health concerns are expressed or addressed at the visit. Recent labs, if available are reviewed. Immunization is reviewed , and  updated if needed.   PE: Pleasant  female, alert and oriented x 3, in no cardio-pulmonary distress. Afebrile. HEENT No facial trauma or asymetry. Sinuses non tender.  Extra occullar muscles intact, pupils equally reactive to light. External ears normal, tympanic membranes clear. Oropharynx moist, no exudate. Neck: supple, no adenopathy,JVD or thyromegaly.No bruits.  Chest: Clear to ascultation bilaterally.No crackles or wheezes. Non tender to palpation  Breast: No asymetry,no masses or lumps. No tenderness. No nipple discharge or inversion. No axillary or supraclavicular adenopathy  Cardiovascular system; Heart sounds normal,  S1 and  S2 ,no S3.  No murmur, or thrill. Apical beat not displaced Peripheral pulses normal.  Abdomen: Soft, non tender, no organomegaly or masses. No bruits. Bowel sounds normal. No guarding, tenderness or rebound.  Rectal:  Normal sphincter tone. No rectal mass. Guaiac negative stool.  GU: External genitalia normal female genitalia , normal female distribution of hair. No lesions. Urethral meatus normal in size, no  Prolapse, no lesions visibly  Present. Bladder non tender. Vagina pink and moist , with no visible lesions , discharge present . Adequate pelvic support no  cystocele or rectocele noted Cervix pink and appears healthy, no lesions or ulcerations noted, no discharge noted from os Uterus normal size, no adnexal masses, no cervical motion or adnexal tenderness.   Musculoskeletal exam: Full ROM of spine, hips , shoulders and knees. No deformity ,swelling or crepitus noted. No muscle wasting or atrophy.   Neurologic: Cranial nerves 2 to 12 intact. Power, tone ,sensation and  reflexes normal throughout. No disturbance in gait. No tremor.  Skin: Intact, no ulceration, erythema , scaling or rash noted. Pigmentation normal throughout  Psych; Normal mood and affect. Judgement and concentration normal   Assessment & Plan:  Annual physical exam Annual exam as documented. Counseling done  re healthy lifestyle involving commitment to 150 minutes exercise per week, heart healthy diet, and attaining healthy weight.The importance of adequate sleep also discussed. Regular seat belt use and home safety, is also discussed. Changes in health habits are decided on by the patient with goals and time frames  set for achieving them. Immunization and cancer screening needs are specifically addressed at this visit.   Left anterior knee pain 2 day history, no trauma, short course of anti inflammatory

## 2016-11-20 ENCOUNTER — Other Ambulatory Visit: Payer: Self-pay | Admitting: Family Medicine

## 2016-11-20 LAB — CYTOLOGY - PAP
Diagnosis: NEGATIVE
HPV (WINDOPATH): NOT DETECTED

## 2016-11-25 ENCOUNTER — Other Ambulatory Visit: Payer: Self-pay

## 2016-11-25 MED ORDER — METRONIDAZOLE 500 MG PO TABS: ORAL_TABLET | ORAL | 0 refills | Status: DC

## 2017-04-21 ENCOUNTER — Ambulatory Visit: Payer: BLUE CROSS/BLUE SHIELD | Admitting: Family Medicine

## 2017-10-31 ENCOUNTER — Other Ambulatory Visit: Payer: Self-pay | Admitting: Family Medicine

## 2017-10-31 MED ORDER — IBUPROFEN 800 MG PO TABS: 800.0000 mg | ORAL_TABLET | Freq: Three times a day (TID) | ORAL | 0 refills | Status: DC

## 2017-11-17 ENCOUNTER — Telehealth: Payer: Self-pay | Admitting: Family Medicine

## 2017-11-17 NOTE — Telephone Encounter (Signed)
Lft msg that we received Leave Papers from Zara CouncilKeundra Tucker, on 11-17-17, and before we could complete the information she will need to be seen by Dr Lodema HongSimpson. The paper work is in the [employer]older.

## 2017-12-15 ENCOUNTER — Ambulatory Visit (INDEPENDENT_AMBULATORY_CARE_PROVIDER_SITE_OTHER): Payer: BLUE CROSS/BLUE SHIELD | Admitting: Family Medicine

## 2017-12-15 ENCOUNTER — Encounter: Payer: Self-pay | Admitting: Family Medicine

## 2017-12-15 VITALS — BP 118/80 | HR 85 | Resp 16 | Ht 60.0 in | Wt 285.0 lb

## 2017-12-15 DIAGNOSIS — R7303 Prediabetes: Secondary | ICD-10-CM

## 2017-12-15 DIAGNOSIS — F339 Major depressive disorder, recurrent, unspecified: Secondary | ICD-10-CM

## 2017-12-15 DIAGNOSIS — E785 Hyperlipidemia, unspecified: Secondary | ICD-10-CM | POA: Diagnosis not present

## 2017-12-15 DIAGNOSIS — E559 Vitamin D deficiency, unspecified: Secondary | ICD-10-CM | POA: Diagnosis not present

## 2017-12-15 DIAGNOSIS — E8881 Metabolic syndrome: Secondary | ICD-10-CM

## 2017-12-15 DIAGNOSIS — F411 Generalized anxiety disorder: Secondary | ICD-10-CM | POA: Diagnosis not present

## 2017-12-15 DIAGNOSIS — Z23 Encounter for immunization: Secondary | ICD-10-CM

## 2017-12-15 MED ORDER — BUSPIRONE HCL 5 MG PO TABS: 5.0000 mg | ORAL_TABLET | Freq: Three times a day (TID) | ORAL | 5 refills | Status: DC

## 2017-12-15 MED ORDER — PHENTERMINE HCL 37.5 MG PO TABS: 37.5000 mg | ORAL_TABLET | Freq: Every day | ORAL | 1 refills | Status: DC

## 2017-12-15 NOTE — Patient Instructions (Addendum)
F/u in 4 month, call if you need me sooner  Flu vaccine today  Fasting CBC, lipid, cmp and eGFR, HBA1C, TSH and vit D asap   New is half phentermine daily with 15 pound weight loss goal in next 4 months  Buspar is prescribed as before  Pheysical exam form for job may be dropped off  Please s also send in your form for fMLA  It is important that you exercise regularly at least 30 minutes 5 times a week. If you develop chest pain, have severe difficulty breathing, or feel very tired, stop exercising immediately and seek medical attention

## 2017-12-21 ENCOUNTER — Encounter: Payer: Self-pay | Admitting: Family Medicine

## 2017-12-21 NOTE — Assessment & Plan Note (Signed)
After obtaining informed consent, the vaccine is  administered by LPN.  

## 2017-12-21 NOTE — Assessment & Plan Note (Signed)
Not suicidal or homicidal, with control of anxiety and better focus on lifestyle for weight management , this should improve

## 2017-12-21 NOTE — Assessment & Plan Note (Signed)
Patient educated about the importance of limiting  Carbohydrate intake , the need to commit to daily physical activity for a minimum of 30 minutes , and to commit weight loss. The fact that changes in all these areas will reduce or eliminate all together the development of diabetes is stressed.   Updated lab needed   Diabetic Labs Latest Ref Rng & Units 12/20/2017 07/13/2016 10/30/2015 02/06/2015 06/04/2014  HbA1c <5.7 % - 5.7(H) 5.8(H) 5.9(H) 5.9(H)  Chol <200 mg/dL 161145 096145 - 045143 409135  HDL >50 mg/dL 81(X40(L) 91(Y43(L) - 78(G42(L) 44  Calc LDL <130 mg/dL - 88 - 91 78  Triglycerides <150 mg/dL 66 71 - 50 65  Creatinine 0.50 - 1.10 mg/dL 9.560.94 2.131.05 - 0.860.93 -   BP/Weight 12/15/2017 11/18/2016 08/27/2016 08/15/2016 08/05/2016 02/26/2016 10/30/2015  Systolic BP 118 134 134 126 118 118 128  Diastolic BP 80 84 86 82 82 80 72  Wt. (Lbs) 285 273 278 279 279 275 277  BMI 55.66 53.32 54.29 54.49 54.49 53.71 54.1   No flowsheet data found.

## 2017-12-21 NOTE — Assessment & Plan Note (Signed)
Needs to resume medication on a regular and daily basis to be able to function

## 2017-12-21 NOTE — Assessment & Plan Note (Signed)
Deteriorated. Patient re-educated about  the importance of commitment to a  minimum of 150 minutes of exercise per week.  The importance of healthy food choices with portion control discussed. Encouraged to start a food diary, count calories and to consider  joining a support group. Sample diet sheets offered. Goals set by the patient for the next several months.   Weight /BMI 12/15/2017 11/18/2016 08/27/2016  WEIGHT 285 lb 273 lb 278 lb  HEIGHT 5\' 0"  5\' 0"  5\' 0"   BMI 55.66 kg/m2 53.32 kg/m2 54.29 kg/m2   Resume half phentermine daily with weight loss goal of 4 to 5 pounds per month

## 2017-12-21 NOTE — Progress Notes (Signed)
[name]     MRN: [id number]      DOB: 1981/04/20   HPI Ms. Gillham is here for follow up and re-evaluation of chronic medical conditions, medication management and review of any available recent lab and radiology data.  Preventive health is updated, specifically  Cancer screening and Immunization.   Has been out of buspar for nicety in past month with increased anxiety , needs to stat on  This and also requests ongoing FMLA form completion for anxiety Has gained weight , flip flops on lifestyle change, plans to stay on track and requests phentermine to help with this   ROS Denies recent fever or chills. Denies sinus pressure, nasal congestion, ear pain or sore throat. Denies chest congestion, productive cough or wheezing. Denies chest pains, palpitations and leg swelling Denies abdominal pain, nausea, vomiting,diarrhea or constipation.   Denies dysuria, frequency, hesitancy or incontinence. Denies joint pain, swelling and limitation in mobility. Denies headaches, seizures, numbness, or tingling.  Denies skin break down or rash.   PE  BP 118/80   Pulse 85   Resp 16   Ht 5' (1.524 m)   Wt 285 lb (129.3 kg)   SpO2 97%   BMI 55.66 kg/m   Patient alert and oriented and in no cardiopulmonary distress.  HEENT: No facial asymmetry, EOMI,   oropharynx pink and moist.  Neck supple no JVD, no mass.  Chest: Clear to auscultation bilaterally.  CVS: S1, S2 no murmurs, no S3.Regular rate.  ABD: Soft non tender.   Ext: No edema  MS: Adequate ROM spine, shoulders, hips and knees.  Skin: Intact, no ulcerations or rash noted.  Psych: Good eye contact, normal affect. Memory intact not anxious or depressed appearing.  CNS: CN 2-12 intact, power,  normal throughout.no focal deficits noted.   Assessment & Plan  GAD (generalized anxiety disorder) Needs to resume medication on a regular and daily basis to be able to function  MORBID OBESITY Deteriorated. Patient  re-educated about  the importance of commitment to a  minimum of 150 minutes of exercise per week.  The importance of healthy food choices with portion control discussed. Encouraged to start a food diary, count calories and to consider  joining a support group. Sample diet sheets offered. Goals set by the patient for the next several months.   Weight /BMI 12/15/2017 11/18/2016 08/27/2016  WEIGHT 285 lb 273 lb 278 lb  HEIGHT 5\' 0"  5\' 0"  5\' 0"   BMI 55.66 kg/m2 53.32 kg/m2 54.29 kg/m2   Resume half phentermine daily with weight loss goal of 4 to 5 pounds per month   Need for prophylactic vaccination and inoculation against influenza After obtaining informed consent, the vaccine is  administered by LPN.   Prediabetes Patient educated about the importance of limiting  Carbohydrate intake , the need to commit to daily physical activity for a minimum of 30 minutes , and to commit weight loss. The fact that changes in all these areas will reduce or eliminate all together the development of diabetes is stressed.   Updated lab needed   Diabetic Labs Latest Ref Rng & Units 12/20/2017 07/13/2016 10/30/2015 02/06/2015 06/04/2014  HbA1c <5.7 % - 5.7(H) 5.8(H) 5.9(H) 5.9(H)  Chol <200 mg/dL 409 811 - 914 782  HDL >50 mg/dL 95(A) 21(H) - 08(M) 44  Calc LDL <130 mg/dL - 88 - 91 78  Triglycerides <150 mg/dL 66 71 - 50 65  Creatinine 0.50 - 1.10 mg/dL 5.78 4.69 - 6.29 -  BP/Weight 12/15/2017 11/18/2016 08/27/2016 08/15/2016 08/05/2016 02/26/2016 10/30/2015  Systolic BP 118 134 134 126 118 118 128  Diastolic BP 80 84 86 82 82 80 72  Wt. (Lbs) 285 273 278 279 279 275 277  BMI 55.66 53.32 54.29 54.49 54.49 53.71 54.1   No flowsheet data found.    Depression, recurrent (HCC) Not suicidal or homicidal, with control of anxiety and better focus on lifestyle for weight management , this should improve

## 2017-12-22 LAB — HEMOGLOBIN A1C
EAG (MMOL/L): 6.2 (calc)
HEMOGLOBIN A1C: 5.5 %{Hb} (ref <5.7)
Mean Plasma Glucose: 111 (calc)

## 2017-12-22 LAB — COMPLETE METABOLIC PANEL WITH GFR
AG RATIO: 1.3 (calc) (ref 1.0–2.5)
ALBUMIN MSPROF: 3.6 g/dL (ref 3.6–5.1)
ALT: 13 U/L (ref 6–29)
AST: 13 U/L (ref 10–30)
Alkaline phosphatase (APISO): 60 U/L (ref 33–115)
BILIRUBIN TOTAL: 0.5 mg/dL (ref 0.2–1.2)
BUN: 14 mg/dL (ref 7–25)
CHLORIDE: 107 mmol/L (ref 98–110)
CO2: 26 mmol/L (ref 20–32)
Calcium: 8.2 mg/dL — ABNORMAL LOW (ref 8.6–10.2)
Creat: 0.94 mg/dL (ref 0.50–1.10)
GFR, EST AFRICAN AMERICAN: 90 mL/min/{1.73_m2} (ref >=60)
GFR, Est Non African American: 77 mL/min/{1.73_m2} (ref >=60)
Globulin: 2.7 g/dL (calc) (ref 1.9–3.7)
Glucose, Bld: 97 mg/dL (ref 65–99)
POTASSIUM: 3.8 mmol/L (ref 3.5–5.3)
Sodium: 139 mmol/L (ref 135–146)
TOTAL PROTEIN: 6.3 g/dL (ref 6.1–8.1)

## 2017-12-22 LAB — CBC
HEMATOCRIT: 34.9 % — AB (ref 35.0–45.0)
Hemoglobin: 11.3 g/dL — ABNORMAL LOW (ref 11.7–15.5)
MCH: 27.2 pg (ref 27.0–33.0)
MCHC: 32.4 g/dL (ref 32.0–36.0)
MCV: 83.9 fL (ref 80.0–100.0)
MPV: 10.9 fL (ref 7.5–12.5)
PLATELETS: 356 10*3/uL (ref 140–400)
RBC: 4.16 10*6/uL (ref 3.80–5.10)
RDW: 13.1 % (ref 11.0–15.0)
WBC: 5.9 10*3/uL (ref 3.8–10.8)

## 2017-12-22 LAB — LIPID PANEL
Cholesterol: 145 mg/dL (ref <200)
HDL: 40 mg/dL — AB (ref >50)
LDL Cholesterol (Calc): 90 mg/dL (calc)
Non-HDL Cholesterol (Calc): 105 mg/dL (calc) (ref <130)
TRIGLYCERIDES: 66 mg/dL (ref <150)
Total CHOL/HDL Ratio: 3.6 (calc) (ref <5.0)

## 2017-12-22 LAB — VITAMIN D 25 HYDROXY (VIT D DEFICIENCY, FRACTURES): Vit D, 25-Hydroxy: 4 ng/mL — ABNORMAL LOW (ref 30–100)

## 2017-12-23 ENCOUNTER — Encounter: Payer: Self-pay | Admitting: Family Medicine

## 2017-12-23 ENCOUNTER — Other Ambulatory Visit: Payer: Self-pay | Admitting: Family Medicine

## 2017-12-23 MED ORDER — ERGOCALCIFEROL 1.25 MG (50000 UT) PO CAPS: 50000.0000 [IU] | ORAL_CAPSULE | ORAL | 1 refills | Status: DC

## 2017-12-31 ENCOUNTER — Other Ambulatory Visit: Payer: Self-pay | Admitting: Family Medicine

## 2017-12-31 ENCOUNTER — Telehealth: Payer: Self-pay | Admitting: Family Medicine

## 2017-12-31 ENCOUNTER — Encounter: Payer: Self-pay | Admitting: Family Medicine

## 2017-12-31 MED ORDER — MONTELUKAST SODIUM 10 MG PO TABS: 10.0000 mg | ORAL_TABLET | Freq: Every day | ORAL | 3 refills | Status: DC

## 2017-12-31 NOTE — Telephone Encounter (Signed)
LVM that the Proof of physical letter is completed. It is in the brown folder at the front desk.

## 2018-02-04 ENCOUNTER — Telehealth: Payer: Self-pay | Admitting: Family Medicine

## 2018-02-04 NOTE — Telephone Encounter (Signed)
Patient left a voicemail requesting a returned call. When I called her back [phone] ext [phone] -- patient stated she had what she needed.

## 2018-04-20 ENCOUNTER — Ambulatory Visit: Payer: BLUE CROSS/BLUE SHIELD | Admitting: Family Medicine

## 2018-06-08 ENCOUNTER — Telehealth: Payer: Self-pay

## 2018-06-08 ENCOUNTER — Ambulatory Visit (INDEPENDENT_AMBULATORY_CARE_PROVIDER_SITE_OTHER): Payer: BLUE CROSS/BLUE SHIELD | Admitting: Family Medicine

## 2018-06-08 ENCOUNTER — Encounter: Payer: Self-pay | Admitting: Family Medicine

## 2018-06-08 ENCOUNTER — Other Ambulatory Visit: Payer: Self-pay

## 2018-06-08 VITALS — BP 116/80 | HR 90 | Resp 12 | Ht 60.0 in | Wt 283.0 lb

## 2018-06-08 DIAGNOSIS — Z23 Encounter for immunization: Secondary | ICD-10-CM | POA: Diagnosis not present

## 2018-06-08 DIAGNOSIS — E8881 Metabolic syndrome: Secondary | ICD-10-CM

## 2018-06-08 DIAGNOSIS — E559 Vitamin D deficiency, unspecified: Secondary | ICD-10-CM

## 2018-06-08 DIAGNOSIS — F339 Major depressive disorder, recurrent, unspecified: Secondary | ICD-10-CM | POA: Diagnosis not present

## 2018-06-08 DIAGNOSIS — F411 Generalized anxiety disorder: Secondary | ICD-10-CM

## 2018-06-08 DIAGNOSIS — R7303 Prediabetes: Secondary | ICD-10-CM

## 2018-06-08 MED ORDER — PAROXETINE HCL 20 MG PO TABS: 20.0000 mg | ORAL_TABLET | Freq: Every day | ORAL | 2 refills | Status: DC

## 2018-06-08 MED ORDER — BUSPIRONE HCL 7.5 MG PO TABS: 7.5000 mg | ORAL_TABLET | Freq: Three times a day (TID) | ORAL | 2 refills | Status: DC

## 2018-06-08 NOTE — Assessment & Plan Note (Signed)
Increased due to poor health of her Mother and also her Mother in law, not suicidal or homicidal, but often wishes she could escape from the problems

## 2018-06-08 NOTE — BH Specialist Note (Signed)
[facility] Telephone Follow-up  MRN: [id number] NAME: [name] Date: 06/29/2018   Total time: 30 minutes Call number: 1/6  Reason for call today: [facility] Phone Initial Intake Assessment   PHQ-9 Scores:  Depression screen [facility] 2/9 06/29/18 12/15/2017 12/15/2017 08/05/2016 08/05/2016  Decreased Interest 1 1 2 1 1   Down, Depressed, Hopeless 1 0 2 1 1   PHQ - 2 Score 2 1 4 2 2   Altered sleeping 2 0 1 0 2  Tired, decreased energy 2 1 1 3 2   Change in appetite 2 2 2  - 2  Feeling bad or failure about yourself  1 2 2 1 1   Trouble concentrating 2 2 1 1 2   Moving slowly or fidgety/restless 1 2 1  0 1  Suicidal thoughts 0 0 0 0 0  PHQ-9 Score 12 10 12 7 12   Difficult doing work/chores Somewhat difficult Somewhat difficult Somewhat difficult - Somewhat difficult  Some recent data might be hidden    GAD-7 Scores:  GAD 7 : Generalized Anxiety Score Jun 29, 2018 12/15/2017 12/15/2017 08/05/2016  Nervous, Anxious, on Edge 3 2 1 1   Control/stop worrying 3 2 1 1   Worry too much - different things 3 2 2 1   Trouble relaxing 3 1 2  0  Restless 2 1 2  0  Easily annoyed or irritable 2 2 2  0  Afraid - awful might happen 2 2 2  0  Total GAD 7 Score 18 12 12 3   Anxiety Difficulty - Somewhat difficult Somewhat difficult Not difficult at all    Stress Current stressors: Current Stressors: (mother has been hospitalized 4 times in the last 2 months); mother in law is also ill; stress at her current job Sleep: Sleep: No problems Appetite: Appetite: No problems Coping ability: Coping ability: Exhausted, Overwhelmed   Patient taking medications as prescribed: Patient taking medications as prescribed: Yes  Current medications:  Outpatient Encounter Medications as of 06-29-18  Medication Sig  . busPIRone (BUSPAR) 7.5 MG tablet Take 1 tablet (7.5 mg total) by mouth 3 (three) times daily.  . ergocalciferol (VITAMIN D2) 50000 units capsule Take 1 capsule (50,000 Units total) by mouth once a week. One  capsule once weekly  . montelukast (SINGULAIR) 10 MG tablet Take 1 tablet (10 mg total) by mouth at bedtime.  Marland Kitchen PARoxetine (PAXIL) 20 MG tablet Take 1 tablet (20 mg total) by mouth daily.   No facility-administered encounter medications on file as of 2018/06/29.      Self-harm Behaviors Risk Assessment Self-harm risk factors: Self-harm risk factors: (None Reported) Patient endorses recent thoughts of harming self:  None Reported  [place] Suicide Severity Rating Scale: No flowsheet data found. C-SRSS June 29, 2018  1. Wish to be Dead No  2. Suicidal Thoughts No  6. Suicide Behavior Question No     Danger to Others Risk Assessment Danger to others risk factors: Danger to Others Risk Factors: No risk factors noted Patient endorses recent thoughts of harming others: Notification required: No need or identified person    Substance Use Assessment Patient recently consumed alcohol:  None Reported  Alcohol Use Disorder Identification Test (AUDIT):  Alcohol Use Disorder Test (AUDIT) June 29, 2018  1. How often do you have a drink containing alcohol? 0  2. How many drinks containing alcohol do you have on a typical day when you are drinking? 0  3. How often do you have six or more drinks on one occasion? 0  AUDIT-C Score 0  Intervention/Follow-up AUDIT Score <7 follow-up not indicated  Patient recently used drugs:  No     Goals, Interventions and Follow-up Plan Goals: Increase healthy adjustment to current life circumstances Interventions: Mindfulness or Relaxation Training and Supportive Counseling Follow-up Plan: [facility] Phone Follow Up   Summary:   Patient is a 37 year old female.  Patient reports depression due to her mother being in the hospital on four separate occassions due to heart surgeries.  Patient reports that her mother is currently in the hospital again  Her mother in law has stage 4 kidney cancer. Patient reports anxiety that her mother or her mother in law will die.  Patient  reports that her grandmother died 3 years ago.    Patient reports that she has not wanted to get out of bed for several days.    Patient denies SI/HI/Psychosis/Substance Abuse. If your symptoms worsen or you have thoughts of suicide/homicide, PLEASE SEEK IMMEDIATE MEDICAL ATTENTION.  You may always call:  National Suicide Hotline: 667-797-8935732-209-9846;  [place] Crisis Line: 309-777-2312503-319-2365;  Crisis Recovery in [place]: [phone].  These are available 24 hours a day, 7 days a week.   Patient denies having any interests presently.  Patient reports that she will isolate herself from other due to depression.  Patient reports that she did crafts and enjoyed reading in the past.   Patient reports that she has been married for 12 years and has three children ages 7312,14 and 2016.     During the next session the patient will pick out a book that she would like to read.  Patient will journal reasons why she needs to get out of bed.   Phillip HealStevenson, Kem Hensen [place], LCAS-A

## 2018-06-08 NOTE — BH Specialist Note (Signed)
Charting in error

## 2018-06-08 NOTE — Patient Instructions (Addendum)
F/u in first week in January , call if you need me ebfore  Flu vaccine today  Teletherapy today and you are referred to Psychiatry also  Lab today HBA1C and TSH, and chem 7 ( ensure calcium is checked) and  Iron an d ferritin levels  Please commit to taking vit D capsule prescribed every Monday  Work excuse  Start today and return on Thursday 8/26 to return 8/29/ 2019   Increase in buspar to 7.5 mg tablet every 8 hours  New is paroxetine for depression and anxiety  Stop phentermine at this time , mental health is the bigger need  It is important that you exercise regularly at least 30 minutes 5 times a week. If you develop chest pain, have severe difficulty breathing, or feel very tired, stop exercising immediately and seek medical attention

## 2018-06-08 NOTE — Assessment & Plan Note (Signed)
After obtaining informed consent, the vaccine is  administered by LPN.  

## 2018-06-08 NOTE — Assessment & Plan Note (Addendum)
Uncontrolled and increased due to stress, difficulty t doing her job, sometimes feel she needs mental health work excuse, mother has been hospitalized 4 times in the last 2 months, increase dose of buspar and add paxil as well as [facility] and for psychotherapy

## 2018-06-09 ENCOUNTER — Encounter: Payer: Self-pay | Admitting: Family Medicine

## 2018-06-09 LAB — TSH: TSH: 1.18 mIU/L

## 2018-06-09 LAB — BASIC METABOLIC PANEL
BUN: 12 mg/dL (ref 7–25)
CALCIUM: 9.1 mg/dL (ref 8.6–10.2)
CO2: 26 mmol/L (ref 20–32)
Chloride: 107 mmol/L (ref 98–110)
Creat: 0.99 mg/dL (ref 0.50–1.10)
GLUCOSE: 100 mg/dL — AB (ref 65–99)
Potassium: 4.1 mmol/L (ref 3.5–5.3)
SODIUM: 139 mmol/L (ref 135–146)

## 2018-06-09 LAB — HEMOGLOBIN A1C
HEMOGLOBIN A1C: 5.8 %{Hb} — AB (ref <5.7)
MEAN PLASMA GLUCOSE: 120 (calc)
eAG (mmol/L): 6.6 (calc)

## 2018-06-09 LAB — VITAMIN D 25 HYDROXY (VIT D DEFICIENCY, FRACTURES): VIT D 25 HYDROXY: 16 ng/mL — AB (ref 30–100)

## 2018-06-09 LAB — IRON: IRON: 46 ug/dL (ref 40–190)

## 2018-06-09 LAB — FERRITIN: Ferritin: 88 ng/mL (ref 16–154)

## 2018-06-09 MED ORDER — ERGOCALCIFEROL 1.25 MG (50000 UT) PO CAPS: 50000.0000 [IU] | ORAL_CAPSULE | ORAL | 3 refills | Status: DC

## 2018-06-14 ENCOUNTER — Encounter: Payer: Self-pay | Admitting: Family Medicine

## 2018-06-14 NOTE — Assessment & Plan Note (Signed)
Patient educated about the importance of limiting  Carbohydrate intake , the need to commit to daily physical activity for a minimum of 30 minutes , and to commit weight loss. The fact that changes in all these areas will reduce or eliminate all together the development of diabetes is stressed.  Deteriorated, needs to lower sugar and carb intake and work on weight loss  Diabetic Labs Latest Ref Rng & Units 06/08/2018 12/20/2017 07/13/2016 10/30/2015 02/06/2015  HbA1c <5.7 % of total Hgb 5.8(H) 5.5 5.7(H) 5.8(H) 5.9(H)  Chol <200 mg/dL - 342 876 - 811  HDL >57 mg/dL - 26(O) 03(T) - 59(R)  Calc LDL mg/dL (calc) - 90 88 - 91  Triglycerides <150 mg/dL - 66 71 - 50  Creatinine 0.50 - 1.10 mg/dL 4.16 3.84 5.36 - 4.68   BP/Weight 06/08/2018 12/15/2017 11/18/2016 08/27/2016 08/15/2016 08/05/2016 02/26/2016  Systolic BP 116 118 134 134 126 118 118  Diastolic BP 80 80 84 86 82 82 80  Wt. (Lbs) 283.04 285 273 278 279 279 275  BMI 55.28 55.66 53.32 54.29 54.49 54.49 53.71   No flowsheet data found.

## 2018-06-14 NOTE — Assessment & Plan Note (Signed)
Unchanged, mentally incapable of focusing on weight loss at this time, stop phentermine. Patient re-educated about  the importance of commitment to a  minimum of 150 minutes of exercise per week.  Sample diet sheets offered. Goals set by the patient for the next several months.   Weight /BMI 06/08/2018 12/15/2017 11/18/2016  WEIGHT 283 lb 0.6 oz 285 lb 273 lb  HEIGHT 5\' 0"  5\' 0"  5\' 0"   BMI 55.28 kg/m2 55.66 kg/m2 53.32 kg/m2

## 2018-06-14 NOTE — Progress Notes (Signed)
[name]     MRN: [id number]      DOB: Jul 20, 1981   HPI [name] is here for follow up and re-evaluation of chronic medical conditions, medication management and review of any available recent lab and radiology data.  Preventive health is updated, specifically  Cancer screening and Immunization.   C/o  increased stress and anxiety over t her Mother's recurent hospitalizations as well as the fact that her Mother in law is also very ill. Often overwhelmed and is experiencing increasing difficulty both at home and on the job as far as her focus and concentration are concerned. Though not actively suicidal or homicidal, she often wishes that she could escape from the problems  ROS Denies recent fever or chills. Denies sinus pressure, nasal congestion, ear pain or sore throat. Denies chest congestion, productive cough or wheezing. Denies chest pains, palpitations and leg swelling Denies abdominal pain, nausea, vomiting,diarrhea or constipation.   Denies dysuria, frequency, hesitancy or incontinence. C/o intermirtent  joint pain, swelling and limitation in mobility. Denies headaches, seizures, numbness, or tingling. Denies skin break down or rash.   PE  BP 116/80 (BP Location: Left Arm, Patient Position: Sitting, Cuff Size: Large)   Pulse 90   Resp 12   Ht 5' (1.524 m)   Wt 283 lb 0.6 oz (128.4 kg)   SpO2 99% Comment: room air  BMI 55.28 kg/m   Patient alert and oriented and in no cardiopulmonary distress.  HEENT: No facial asymmetry, EOMI,   oropharynx pink and moist.  Neck supple no JVD, no mass.  Chest: Clear to auscultation bilaterally.  CVS: S1, S2 no murmurs, no S3.Regular rate.  ABD: Soft non tender.   Ext: No edema  MS: Adequate ROM spine, shoulders, hips and reduced in nees.  Skin: Intact, no ulcerations or rash noted.  Psych: poor  eye contact, flat and at to times tearful  affect. Memory intact not anxious but depressed  depressed  appearing.  CNS: CN 2-12 intact, power,  normal throughout.no focal deficits noted.   Assessment & Plan  Need for prophylactic vaccination and inoculation against influenza After obtaining informed consent, the vaccine is  administered by LPN.   Depression, recurrent (HCC) Increased due to poor health of her Mother and also her Mother in law, not suicidal or homicidal, but often wishes she could escape from the problems  GAD (generalized anxiety disorder) Uncontrolled and increased due to stress, difficulty t doing her job, sometimes feel she needs mental health work excuse, mother has been hospitalized 4 times in the last 2 months, increase dose of buspar and add paxil as well as refer to [facility] for psychiatry and for psychotherapy  MORBID OBESITY Unchanged, mentally incapable of focusing on weight loss at this time, stop phentermine. Patient re-educated about  the importance of commitment to a  minimum of 150 minutes of exercise per week.  Sample diet sheets offered. Goals set by the patient for the next several months.   Weight /BMI 06/08/2018 12/15/2017 11/18/2016  WEIGHT 283 lb 0.6 oz 285 lb 273 lb  HEIGHT 5\' 0"  5\' 0"  5\' 0"   BMI 55.28 kg/m2 55.66 kg/m2 53.32 kg/m2      Prediabetes Patient educated about the importance of limiting  Carbohydrate intake , the need to commit to daily physical activity for a minimum of 30 minutes , and to commit weight loss. The fact that changes in all these areas will reduce or eliminate all together the development of diabetes is  stressed.  Deteriorated, needs to lower sugar and carb intake and work on weight loss  Diabetic Labs Latest Ref Rng & Units 06/08/2018 12/20/2017 07/13/2016 10/30/2015 02/06/2015  HbA1c <5.7 % of total Hgb 5.8(H) 5.5 5.7(H) 5.8(H) 5.9(H)  Chol <200 mg/dL - 409 811 - 914  HDL >78 mg/dL - 29(F) 62(Z) - 30(Q)  Calc LDL mg/dL (calc) - 90 88 - 91  Triglycerides <150 mg/dL - 66 71 - 50  Creatinine 0.50 - 1.10 mg/dL 6.57 8.46 9.62 -  9.52   BP/Weight 06/08/2018 12/15/2017 11/18/2016 08/27/2016 08/15/2016 08/05/2016 02/26/2016  Systolic BP 116 118 134 134 126 118 118  Diastolic BP 80 80 84 86 82 82 80  Wt. (Lbs) 283.04 285 273 278 279 279 275  BMI 55.28 55.66 53.32 54.29 54.49 54.49 53.71   No flowsheet data found.

## 2018-06-17 NOTE — Progress Notes (Signed)
[name] is a 37 y.o. year old female with a history of depression. Worsening depression in the context of her mother's recent admissions (diagnosed with stage IV kidney cancer) and her grandmother's health issues. Paxil was started and buspar was uptitrated by PCP.   - Patient was referred to psychiatry;  will make referral.  - BH specialist to offer supportive therapy, coach behavioral activation.

## 2018-06-18 ENCOUNTER — Encounter: Payer: Self-pay | Admitting: Family Medicine

## 2018-06-18 ENCOUNTER — Telehealth: Payer: Self-pay | Admitting: Family Medicine

## 2018-06-18 NOTE — Telephone Encounter (Signed)
FMLA  Copied Sleeved Gave to Dr Lodema Hong

## 2018-06-23 ENCOUNTER — Telehealth: Payer: Self-pay | Admitting: Family Medicine

## 2018-06-23 NOTE — Telephone Encounter (Signed)
RECEIVED PATIENTS PAPERWORK BY FAX.

## 2018-06-24 ENCOUNTER — Telehealth: Payer: Self-pay

## 2018-06-24 NOTE — Telephone Encounter (Signed)
[facility] - Left Msg 

## 2018-06-30 ENCOUNTER — Telehealth: Payer: Self-pay

## 2018-06-30 ENCOUNTER — Telehealth (HOSPITAL_COMMUNITY): Payer: Self-pay

## 2018-06-30 NOTE — Telephone Encounter (Signed)
Per Dr. Hisada - patient was a referred to [facility]. .  Patient has been scheduled to meet with Dr. Hisada on 10- 29-2019.  Patient will be placed on the inactive list.  

## 2018-06-30 NOTE — Telephone Encounter (Signed)
Forms completed, copies made, LVM with pt that the forms are completed and at the front desk, and there was a $29.00 charge

## 2018-06-30 NOTE — Telephone Encounter (Signed)
Per Dr. Vanetta ShawlHisada - [facility] was a [facility]. Marland Kitchen.  Patient has been scheduled to meet with Dr. Vanetta ShawlHisada on 10- 29-2019.  Patient will be placed on the inactive list.

## 2018-07-02 DIAGNOSIS — F339 Major depressive disorder, recurrent, unspecified: Secondary | ICD-10-CM
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
## 2018-08-11 ENCOUNTER — Encounter (HOSPITAL_COMMUNITY): Payer: Self-pay | Admitting: Psychiatry

## 2018-08-11 ENCOUNTER — Ambulatory Visit (INDEPENDENT_AMBULATORY_CARE_PROVIDER_SITE_OTHER): Payer: BLUE CROSS/BLUE SHIELD | Admitting: Psychiatry

## 2018-08-11 VITALS — BP 142/86 | HR 90 | Ht 60.0 in | Wt 286.0 lb

## 2018-08-11 DIAGNOSIS — G4700 Insomnia, unspecified: Secondary | ICD-10-CM | POA: Diagnosis not present

## 2018-08-11 DIAGNOSIS — F331 Major depressive disorder, recurrent, moderate: Secondary | ICD-10-CM

## 2018-08-11 DIAGNOSIS — F419 Anxiety disorder, unspecified: Secondary | ICD-10-CM

## 2018-08-11 MED ORDER — PAROXETINE HCL 30 MG PO TABS: 30.0000 mg | ORAL_TABLET | Freq: Every day | ORAL | 0 refills | Status: DC

## 2018-08-11 NOTE — Patient Instructions (Signed)
1. Increase Paxil 30 mg daily  2. Continue Buspar 7.5 mg twice a day  3. Return to clinic in one month for 30 mins 4. Referral to therapy
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
## 2018-09-14 ENCOUNTER — Other Ambulatory Visit (HOSPITAL_COMMUNITY): Payer: Self-pay | Admitting: Psychiatry

## 2018-09-14 MED ORDER — PAROXETINE HCL 30 MG PO TABS: 30.0000 mg | ORAL_TABLET | Freq: Every day | ORAL | 0 refills | Status: DC

## 2018-09-15 ENCOUNTER — Ambulatory Visit (HOSPITAL_COMMUNITY): Payer: BLUE CROSS/BLUE SHIELD | Admitting: Psychiatry

## 2018-09-19 ENCOUNTER — Encounter (HOSPITAL_COMMUNITY): Payer: Self-pay | Admitting: Emergency Medicine

## 2018-09-19 ENCOUNTER — Other Ambulatory Visit: Payer: Self-pay

## 2018-09-19 ENCOUNTER — Emergency Department (HOSPITAL_COMMUNITY): Payer: BLUE CROSS/BLUE SHIELD

## 2018-09-19 ENCOUNTER — Emergency Department (HOSPITAL_COMMUNITY)
Admission: EM | Admit: 2018-09-19 | Discharge: 2018-09-19 | Disposition: A | Discharge status: Home / Self Care | Payer: BLUE CROSS/BLUE SHIELD | Attending: Emergency Medicine | Admitting: Emergency Medicine

## 2018-09-19 DIAGNOSIS — J209 Acute bronchitis, unspecified: Secondary | ICD-10-CM | POA: Insufficient documentation

## 2018-09-19 DIAGNOSIS — R05 Cough: Secondary | ICD-10-CM | POA: Diagnosis present

## 2018-09-19 MED ORDER — PREDNISONE 10 MG PO TABS: ORAL_TABLET | ORAL | 0 refills | Status: DC

## 2018-09-19 MED ORDER — PREDNISONE 50 MG PO TABS
60.0000 mg | ORAL_TABLET | Freq: Once | ORAL | Status: AC
  Administered 2018-09-19: 60 mg via ORAL
  Filled 2018-09-19: qty 1

## 2018-09-19 NOTE — ED Provider Notes (Signed)
[facility] EMERGENCY DEPARTMENT Provider Note   CSN: [id number] Arrival date & time: 09/19/18  2004     History   Chief Complaint Chief Complaint  Patient presents with  . Cough    HPI [name] is a 37 y.o. female.  The history is provided by the patient. No language interpreter was used.  Cough  This is a new problem. Episode onset: 1 month. The problem occurs constantly. The problem has not changed since onset.The cough is productive of sputum. There has been no fever. Pertinent negatives include no chest pain and no wheezing. She has tried decongestants for the symptoms. The treatment provided no relief. She is not a smoker. Her past medical history does not include pneumonia or asthma.  Pt reports she has had a cough for a month.  Pt was placed on an inhaler and prednisone earlier.  Pt saw Dr. Lodema HongSimpson on Thursday and was started on Zithromax and tessalon perles  Past Medical History:  Diagnosis Date  . Morbid obesity [facility](HCC)     Patient Active Problem List   Diagnosis Date Noted  . Left anterior knee pain 11/18/2016  . Vitamin D deficiency 08/05/2016  . Dyspepsia 10/30/2015  . Need for prophylactic vaccination and inoculation against influenza 06/20/2014  . Metabolic syndrome X 03/21/2014  . Dyslipidemia 03/21/2014  . GAD (generalized anxiety disorder) 03/03/2014  . Panic attack 03/03/2014  . Depression, recurrent (HCC) 11/04/2013  . Prediabetes 05/20/2012  . MORBID OBESITY 12/27/2009  . ALLERGIC RHINITIS CAUSE UNSPECIFIED 12/27/2009    Past Surgical History:  Procedure Laterality Date  . CESAREAN SECTION     x 3  . TUBAL LIGATION       OB History   None      Home Medications    Prior to Admission medications   Medication Sig Start Date End Date Taking? Authorizing Provider  busPIRone (BUSPAR) 7.5 MG tablet Take 1 tablet (7.5 mg total) by mouth 3 (three) times daily. 06/08/18   Kerri PerchesSimpson, Margaret E, MD  ergocalciferol (VITAMIN D2) 50000 units  capsule Take 1 capsule (50,000 Units total) by mouth once a week. One capsule once weekly 06/09/18   Kerri PerchesSimpson, Margaret E, MD  montelukast (SINGULAIR) 10 MG tablet Take 1 tablet (10 mg total) by mouth at bedtime. 12/31/17   Kerri PerchesSimpson, Margaret E, MD  PARoxetine (PAXIL) 30 MG tablet Take 1 tablet (30 mg total) by mouth daily. 09/14/18   Neysa HotterHisada, Reina, MD  predniSONE (DELTASONE) 10 MG tablet 6,5,4,3,2,1 taper 09/19/18   Elson AreasSofia, Dameon Soltis K, PA-C    Family History Family History  Problem Relation Age of Onset  . Heart disease Mother   . Asthma Mother   . Hypertension Mother   . Sleep apnea Mother   . Anxiety disorder Mother   . Diabetes Brother   . Sleep apnea Father   . Anxiety disorder Father     Social History Social History   Tobacco Use  . Smoking status: Never Smoker  . Smokeless tobacco: Never Used  Substance Use Topics  . Alcohol use: No  . Drug use: No     Allergies   Hydrocodone and Penicillins   Review of Systems Review of Systems  Respiratory: Positive for cough. Negative for wheezing.   Cardiovascular: Negative for chest pain.  All other systems reviewed and are negative.    Physical Exam Updated Vital Signs BP 136/90 (BP Location: Right Wrist)   Pulse (!) 106   Temp 98 F (36.7 C) (Oral)  Resp 18   Ht 5' (1.524 m)   Wt 128.4 kg   LMP 09/13/2018   SpO2 98%   BMI 55.27 kg/m   Physical Exam  Constitutional: She appears well-developed and well-nourished.  HENT:  Head: Normocephalic.  Right Ear: External ear normal.  Left Ear: External ear normal.  Mouth/Throat: Oropharynx is clear and moist.  Eyes: Pupils are equal, round, and reactive to light.  Neck: Normal range of motion.  Cardiovascular: Normal rate.  Pulmonary/Chest: Effort normal.  Abdominal: Soft.  Musculoskeletal: Normal range of motion.  Neurological: She is alert.  Skin: Skin is warm.  Psychiatric: She has a normal mood and affect.  Nursing note and vitals reviewed.    ED Treatments  / Results  Labs (all labs ordered are listed, but only abnormal results are displayed) Labs Reviewed - No data to display  EKG None  Radiology Dg Chest 2 View  Result Date: 09/19/2018 CLINICAL DATA:  Cough and congestion EXAM: CHEST - 2 VIEW COMPARISON:  None. FINDINGS: Normal cardiac silhouette. Some coarsening of the bronchovascular markings. No focal consolidation. No pneumothorax or pulmonary edema. No acute osseous abnormality. IMPRESSION: Coarsened bronchovascular markings could represent bronchitis. No focal pneumonia Electronically Signed   By: Genevive Bi M.D.   On: 09/19/2018 21:07    Procedures Procedures (including critical care time)  Medications Ordered in ED Medications  predniSONE (DELTASONE) tablet 60 mg (60 mg Oral Given 09/19/18 2148)     Initial Impression / Assessment and Plan / ED Course  I have reviewed the triage vital signs and the nursing notes.  Pertinent labs & imaging results that were available during my care of the patient were reviewed by me and considered in my medical decision making (see chart for details).     Chest xray shows bronchitis.  Pt advised to continue zithromax,  She has not been using her inhaler.  I advised her to use 2 puffs every 4 hours for the next 2 days.  I will place pt on a prednisone dose.   I advised recheck with Dr. Lodema Hong on Monday   Final Clinical Impressions(s) / ED Diagnoses   Final diagnoses:  Acute bronchitis, unspecified organism    ED Discharge Orders         Ordered    predniSONE (DELTASONE) 10 MG tablet     09/19/18 2137        An After Visit Summary was printed and given to the patient.    Elson Areas, Cordelia Poche 09/19/18 2338    Vanetta Mulders, MD 09/21/18 1535

## 2018-09-19 NOTE — ED Triage Notes (Signed)
Pt c/o cough and chest congestion x 1 mo, pt reports she has seen her PCP and has began taking Z pack, tessalon pearls, zyrtec and pro air inhaler Thurs

## 2018-09-19 NOTE — Discharge Instructions (Addendum)
Use your  pro air inhaler 2 puffs every 4 hours.

## 2018-10-21 ENCOUNTER — Other Ambulatory Visit (HOSPITAL_COMMUNITY): Payer: Self-pay | Admitting: Psychiatry

## 2018-10-21 MED ORDER — PAROXETINE HCL 30 MG PO TABS: 30.0000 mg | ORAL_TABLET | Freq: Every day | ORAL | 0 refills | Status: DC

## 2018-11-05 ENCOUNTER — Ambulatory Visit: Payer: BLUE CROSS/BLUE SHIELD | Admitting: Family Medicine
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
## 2018-11-06 ENCOUNTER — Ambulatory Visit: Payer: BLUE CROSS/BLUE SHIELD | Admitting: Psychology

## 2018-11-06 ENCOUNTER — Encounter (HOSPITAL_COMMUNITY): Payer: Self-pay | Admitting: Psychiatry

## 2018-11-06 ENCOUNTER — Ambulatory Visit (INDEPENDENT_AMBULATORY_CARE_PROVIDER_SITE_OTHER): Payer: BLUE CROSS/BLUE SHIELD | Admitting: Psychiatry

## 2018-11-06 VITALS — BP 166/96 | HR 99 | Ht 60.0 in | Wt 298.8 lb

## 2018-11-06 DIAGNOSIS — F3341 Major depressive disorder, recurrent, in partial remission: Secondary | ICD-10-CM | POA: Diagnosis not present

## 2018-11-06 MED ORDER — PAROXETINE HCL 30 MG PO TABS: 30.0000 mg | ORAL_TABLET | Freq: Every day | ORAL | 0 refills | Status: DC

## 2018-11-06 NOTE — Patient Instructions (Signed)
1 Continue Paxil 30 mg daily  2. Continue Buspar 7.5 mg three times a day  3. Return to clinic in three months for 15 mins

## 2018-11-27 ENCOUNTER — Ambulatory Visit: Payer: BLUE CROSS/BLUE SHIELD | Admitting: Psychology

## 2018-12-08 ENCOUNTER — Ambulatory Visit (INDEPENDENT_AMBULATORY_CARE_PROVIDER_SITE_OTHER): Payer: BLUE CROSS/BLUE SHIELD | Admitting: Family Medicine

## 2018-12-08 ENCOUNTER — Encounter: Payer: Self-pay | Admitting: Family Medicine

## 2018-12-08 VITALS — BP 128/78 | HR 80 | Resp 12 | Ht 60.0 in | Wt 297.0 lb

## 2018-12-08 DIAGNOSIS — R7303 Prediabetes: Secondary | ICD-10-CM

## 2018-12-08 DIAGNOSIS — E559 Vitamin D deficiency, unspecified: Secondary | ICD-10-CM

## 2018-12-08 DIAGNOSIS — Z0289 Encounter for other administrative examinations: Secondary | ICD-10-CM

## 2018-12-08 DIAGNOSIS — E785 Hyperlipidemia, unspecified: Secondary | ICD-10-CM | POA: Diagnosis not present

## 2018-12-08 DIAGNOSIS — E8881 Metabolic syndrome: Secondary | ICD-10-CM

## 2018-12-08 DIAGNOSIS — F3341 Major depressive disorder, recurrent, in partial remission: Secondary | ICD-10-CM

## 2018-12-08 DIAGNOSIS — F411 Generalized anxiety disorder: Secondary | ICD-10-CM

## 2018-12-08 MED ORDER — PHENTERMINE HCL 37.5 MG PO TABS: 37.5000 mg | ORAL_TABLET | Freq: Every day | ORAL | 1 refills | Status: DC

## 2018-12-08 NOTE — Patient Instructions (Addendum)
Annual physical exam by end April, call if you need me before  Fasting lipid, cbc , cmp and EGFr, hBA1C, TSH and vit D 1 week before appt  New is half phentermine daily  Weight loss goal of 4 to 6 pounds per month  Think about what you will eat, plan ahead. Choose " clean, green, fresh or frozen" over canned, processed or packaged foods which are more sugary, salty and fatty. 70 to 75% of food eaten should be vegetables and fruit. Three meals at set times with snacks allowed between meals, but they must be fruit or vegetables. Aim to eat over a 12 hour period , example 7 am to 7 pm, and STOP after  your last meal of the day. Drink water,generally about 64 ounces per day, no other drink is as healthy. Fruit juice is best enjoyed in a healthy way, by EATING the fruit.   It is important that you exercise regularly at least 30 minutes 5 times a week. If you develop chest pain, have severe difficulty breathing, or feel very tired, stop exercising immediately and seek medical attention

## 2018-12-09 ENCOUNTER — Other Ambulatory Visit (HOSPITAL_COMMUNITY): Payer: Self-pay | Admitting: Psychiatry

## 2018-12-09 ENCOUNTER — Encounter: Payer: Self-pay | Admitting: Family Medicine

## 2018-12-09 DIAGNOSIS — Z0289 Encounter for other administrative examinations: Secondary | ICD-10-CM | POA: Insufficient documentation

## 2018-12-09 MED ORDER — BUSPIRONE HCL 7.5 MG PO TABS: 7.5000 mg | ORAL_TABLET | Freq: Three times a day (TID) | ORAL | 2 refills | Status: DC

## 2018-12-09 NOTE — Assessment & Plan Note (Signed)
Taking buspar not as prescribed and needs to be addressed by Psychiatry, will  Send a message

## 2018-12-09 NOTE — Assessment & Plan Note (Signed)
The increased risk of cardiovascular disease associated with this diagnosis, and the need to consistently work on lifestyle to change this is discussed. Following  a  heart healthy diet ,commitment to 30 minutes of exercise at least 5 days per week, as well as control of blood sugar and cholesterol , and achieving a healthy weight are all the areas to be addressed .  

## 2018-12-09 NOTE — Assessment & Plan Note (Signed)
Hyperlipidemia:Low fat diet discussed and encouraged.   Lipid Panel  Lab Results  Component Value Date   CHOL 145 12/20/2017   HDL 40 (L) 12/20/2017   LDLCALC 90 12/20/2017   TRIG 66 12/20/2017   CHOLHDL 3.6 12/20/2017   Needs to commit to more regular exercise

## 2018-12-09 NOTE — Assessment & Plan Note (Signed)
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 Medically cleared for this

## 2018-12-09 NOTE — Assessment & Plan Note (Signed)
Controlled well, and managed by Psychioatry , continue same

## 2018-12-09 NOTE — Assessment & Plan Note (Addendum)
  Updated lab needed   Patient educated about the importance of limiting  Carbohydrate intake , the need to commit to daily physical activity for a minimum of 30 minutes , and to commit weight loss. The fact that changes in all these areas will reduce or eliminate all together the development of diabetes is stressed.   Diabetic Labs Latest Ref Rng & Units 06/08/2018 12/20/2017 07/13/2016 10/30/2015 02/06/2015  HbA1c <5.7 % of total Hgb 5.8(H) 5.5 5.7(H) 5.8(H) 5.9(H)  Chol <200 mg/dL - 637 858 - 850  HDL >27 mg/dL - 74(J) 28(N) - 86(V)  Calc LDL mg/dL (calc) - 90 88 - 91  Triglycerides <150 mg/dL - 66 71 - 50  Creatinine 0.50 - 1.10 mg/dL 6.72 0.94 7.09 - 6.28   BP/Weight 12/08/2018 09/19/2018 06/08/2018 12/15/2017 11/18/2016 08/27/2016 08/15/2016  Systolic BP 128 136 116 118 134 134 126  Diastolic BP 78 90 80 80 84 86 82  Wt. (Lbs) 297 283 283.04 285 273 278 279  BMI 58 55.27 55.28 55.66 53.32 54.29 54.49  Some encounter information is confidential and restricted. Go to Review Flowsheets activity to see all data.   No flowsheet data found.

## 2018-12-09 NOTE — Progress Notes (Signed)
[name]     MRN: [id number]      DOB: [birth year]   HPI [name] is here for follow up and re-evaluation of chronic medical conditions, medication management and review of any available recent lab and radiology data.  Reports improved mental health since starting treatment through Psychiatry, although she has no medication changes Here for exam and form completion to become a foster parent. This is her first time applying. Her medical illness is well controlled, , which is depression and anxiety, and this is managed by Psychiatry, she has never been a threat to herself or  To anyone. She struggles with obesity and is re invested in working on this ROS Denies recent fever or chills. Denies sinus pressure, nasal congestion, ear pain or sore throat. Denies chest congestion, productive cough or wheezing. Denies chest pains, palpitations and leg swelling Denies abdominal pain, nausea, vomiting,diarrhea or constipation.   Denies dysuria, frequency, hesitancy or incontinence. Denies joint pain, swelling and limitation in mobility. Denies headaches, seizures, numbness, or tingling. Denies uncontrolled  depression, anxiety or insomnia. Denies skin break down or rash.   PE  BP 128/78   Pulse 80   Resp 12   Ht 5' (1.524 m)   Wt 297 lb (134.7 kg)   LMP 11/16/2018 (Approximate)   SpO2 97% Comment: room air  BMI 58.00 kg/m   Patient alert and oriented and in no cardiopulmonary distress.  HEENT: No facial asymmetry, EOMI,   oropharynx pink and moist.  Neck supple no JVD, no mass.  Chest: Clear to auscultation bilaterally.  CVS: S1, S2 no murmurs, no S3.Regular rate.  ABD: Soft non tender.   Ext: No edema  MS: Adequate ROM spine, shoulders, hips and knees.  Skin: Intact, no ulcerations or rash noted.  Psych: Good eye contact, normal affect. Memory intact not anxious or depressed appearing.  CNS: CN 2-12 intact, power,  normal throughout.no focal deficits  noted.   Assessment & Plan  MORBID OBESITY Deteriorated.  Patient re-educated about  the importance of commitment to a  minimum of 150 minutes of exercise per week as able.  The importance of healthy food choices with portion control discussed, as well as eating regularly and within a 12 hour window most days. The need to choose "clean , green" food 50 to 75% of the time is discussed, as well as to make water the primary drink and set a goal of 64 ounces water daily.  Encouraged to start a food diary,  and to consider  joining a support group. Sample diet sheets offered. Goals set by the patient for the next several months.   Weight /BMI 12/08/2018 09/19/2018 06/08/2018  WEIGHT 297 lb 283 lb 283 lb 0.6 oz  HEIGHT 5\' 0"  5\' 0"  5\' 0"   BMI 58 kg/m2 55.27 kg/m2 55.28 kg/m2  Some encounter information is confidential and restricted. Go to Review Flowsheets activity to see all data.  start phentermine half tab daily    Recurrent major depressive disorder, in partial remission (HCC) Controlled well, and managed by Psychioatry , continue same  Dyslipidemia Hyperlipidemia:Low fat diet discussed and encouraged.   Lipid Panel  Lab Results  Component Value Date   CHOL 145 12/20/2017   HDL 40 (L) 12/20/2017   LDLCALC 90 12/20/2017   TRIG 66 12/20/2017   CHOLHDL 3.6 12/20/2017   Needs to commit to more regular exercise    Metabolic syndrome X The increased risk of cardiovascular disease associated with this diagnosis, and  the need to consistently work on lifestyle to change this is discussed. Following  a  heart healthy diet ,commitment to 30 minutes of exercise at least 5 days per week, as well as control of blood sugar and cholesterol , and achieving a healthy weight are all the areas to be addressed .   Prediabetes  Updated lab needed   Patient educated about the importance of limiting  Carbohydrate intake , the need to commit to daily physical activity for a minimum of 30  minutes , and to commit weight loss. The fact that changes in all these areas will reduce or eliminate all together the development of diabetes is stressed.   Diabetic Labs Latest Ref Rng & Units 06/08/2018 12/20/2017 07/13/2016 10/30/2015 02/06/2015  HbA1c <5.7 % of total Hgb 5.8(H) 5.5 5.7(H) 5.8(H) 5.9(H)  Chol <200 mg/dL - 876 811 - 572  HDL >62 mg/dL - 03(T) 59(R) - 41(U)  Calc LDL mg/dL (calc) - 90 88 - 91  Triglycerides <150 mg/dL - 66 71 - 50  Creatinine 0.50 - 1.10 mg/dL 3.84 5.36 4.68 - 0.32   BP/Weight 12/08/2018 09/19/2018 06/08/2018 12/15/2017 11/18/2016 08/27/2016 08/15/2016  Systolic BP 128 136 116 118 134 134 126  Diastolic BP 78 90 80 80 84 86 82  Wt. (Lbs) 297 283 283.04 285 273 278 279  BMI 58 55.27 55.28 55.66 53.32 54.29 54.49  Some encounter information is confidential and restricted. Go to Review Flowsheets activity to see all data.   No flowsheet data found.    Encounter for completion of form with patient Pt evaluated in the office , and form completed for her to qualify as a foster parent. Medically cleared for this  GAD (generalized anxiety disorder) Taking buspar not as prescribed and needs to be addressed by Psychiatry, will  Send a message

## 2018-12-09 NOTE — Assessment & Plan Note (Signed)
Deteriorated.  Patient re-educated about  the importance of commitment to a  minimum of 150 minutes of exercise per week as able.  The importance of healthy food choices with portion control discussed, as well as eating regularly and within a 12 hour window most days. The need to choose "clean , green" food 50 to 75% of the time is discussed, as well as to make water the primary drink and set a goal of 64 ounces water daily.  Encouraged to start a food diary,  and to consider  joining a support group. Sample diet sheets offered. Goals set by the patient for the next several months.   Weight /BMI 12/08/2018 09/19/2018 06/08/2018  WEIGHT 297 lb 283 lb 283 lb 0.6 oz  HEIGHT 5\' 0"  5\' 0"  5\' 0"   BMI 58 kg/m2 55.27 kg/m2 55.28 kg/m2  Some encounter information is confidential and restricted. Go to Review Flowsheets activity to see all data.  start phentermine half tab daily

## 2018-12-14 DIAGNOSIS — F3341 Major depressive disorder, recurrent, in partial remission: Secondary | ICD-10-CM
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
## 2019-02-05 ENCOUNTER — Ambulatory Visit (HOSPITAL_COMMUNITY): Payer: BLUE CROSS/BLUE SHIELD | Admitting: Psychiatry

## 2019-02-11 ENCOUNTER — Ambulatory Visit (INDEPENDENT_AMBULATORY_CARE_PROVIDER_SITE_OTHER): Payer: BLUE CROSS/BLUE SHIELD | Admitting: Psychiatry

## 2019-02-11 ENCOUNTER — Encounter (HOSPITAL_COMMUNITY): Payer: Self-pay | Admitting: Psychiatry

## 2019-02-11 ENCOUNTER — Encounter: Payer: Self-pay | Admitting: Family Medicine

## 2019-02-11 ENCOUNTER — Other Ambulatory Visit: Payer: Self-pay

## 2019-02-11 DIAGNOSIS — F3342 Major depressive disorder, recurrent, in full remission: Secondary | ICD-10-CM

## 2019-02-11 MED ORDER — BUSPIRONE HCL 7.5 MG PO TABS: 7.5000 mg | ORAL_TABLET | Freq: Three times a day (TID) | ORAL | 0 refills | Status: DC

## 2019-02-11 MED ORDER — PAROXETINE HCL 30 MG PO TABS: 30.0000 mg | ORAL_TABLET | Freq: Every day | ORAL | 0 refills | Status: DC

## 2019-02-11 NOTE — Patient Instructions (Signed)
1ContinuePaxil 30 mg daily  2. Continue Buspar 7.5 mg three timesa day  3.Next appointment: 7/27 at 1:20 

## 2019-04-28 ENCOUNTER — Encounter: Payer: Self-pay | Admitting: Family Medicine

## 2019-04-28 ENCOUNTER — Other Ambulatory Visit: Payer: Self-pay

## 2019-04-28 ENCOUNTER — Ambulatory Visit: Payer: BC Managed Care – PPO | Admitting: Family Medicine

## 2019-04-28 VITALS — BP 122/82 | HR 98 | Temp 97.1°F | Ht 60.0 in | Wt 307.0 lb

## 2019-04-28 DIAGNOSIS — M25571 Pain in right ankle and joints of right foot: Secondary | ICD-10-CM | POA: Diagnosis not present

## 2019-04-28 MED ORDER — IBUPROFEN 800 MG PO TABS: 800.0000 mg | ORAL_TABLET | Freq: Two times a day (BID) | ORAL | 1 refills | Status: DC

## 2019-04-28 NOTE — Patient Instructions (Signed)
Thank you for coming into the office today. I appreciate the opportunity to provide you with the care for your health and wellness. Today we discussed:   Follow Up: as needed  No labs today  Take Ibuprofen as directed. Call back Friday morning if not better.  Ice your ankle is needed, and get an ace wrap for extra support.  I think your arch is falling, and wearing sandals can make this worse. Keep tennis shoes on for support. Elevated your foot when working and after working.  Once pain is better, please start the stretching exercises that I have attached.  Please continue to practice social distancing to keep you, your family, and our community safe.  If you must go out, please wear a Mask and practice good handwashing.   WASH YOUR HANDS WELL AND FREQUENTLY. AVOID TOUCHING YOUR FACE, UNLESS YOUR HANDS ARE FRESHLY WASHED.  GET FRESH AIR DAILY. STAY HYDRATED WITH WATER.   It was a pleasure to see you and I look forward to continuing to work together on your health and well-being. Please do not hesitate to call the office if you need care or have questions about your care.  Have a wonderful day and week.  With Gratitude,  Tereasa CoopHannah Waseem Suess, DNP, AGNP-BC  * Hold stretches for 10-15 seconds  * Repeat each one 2-3 times  Ankle Exercises Ask your health care provider which exercises are safe for you. Do exercises exactly as told by your health care provider and adjust them as directed. It is normal to feel mild stretching, pulling, tightness, or mild discomfort as you do these exercises. Stop right away if you feel sudden pain or your pain gets worse. Do not begin these exercises until told by your health care provider. Stretching and range-of-motion exercises These exercises warm up your muscles and joints and improve the movement and flexibility of your ankle. These exercises may also help to relieve pain. Dorsiflexion/plantar flexion  1. Sit with your __________ knee  straight or bent. Do not rest your foot on anything. 2. Flex your __________ ankle to tilt the top of your foot toward your shin. This is called dorsiflexion. 3. Hold this position for __________ seconds. 4. Point your toes downward to tilt the top of your foot away from your shin. This is called plantar flexion. 5. Hold this position for __________ seconds. Repeat __________ times. Complete this exercise __________ times a day. Ankle alphabet  1. Sit with your __________ foot supported at your lower leg. ? Do not rest your foot on anything. ? Make sure your foot has room to move freely. 2. Think of your __________ foot as a paintbrush: ? Move your foot to trace each letter of the alphabet in the air. Keep your hip and knee still while you trace the letters. Trace every letter from A to Z. ? Make the letters as large as you can without causing or increasing any discomfort. Repeat __________ times. Complete this exercise __________ times a day. Passive ankle dorsiflexion This is an exercise in which something or someone moves your ankle for you. You do not move it yourself. 1. Sit on a chair that is placed on a non-carpeted surface. 2. Place your __________ foot on the floor, directly under your __________ knee. Extend your __________ leg for support. 3. Keeping your heel down, slide your __________ foot back toward the chair until you feel a stretch at your ankle or calf. If you do not feel a stretch, slide  your buttocks forward to the edge of the chair while keeping your heel down. 4. Hold this stretch for __________ seconds. Repeat __________ times. Complete this exercise __________ times a day. Strengthening exercises These exercises build strength and endurance in your ankle. Endurance is the ability to use your muscles for a long time, even after they get tired. Dorsiflexors These are muscles that lift your foot up. 1. Secure a rubber exercise band or tube to an object, such as a table  leg, that will stay still when the band is pulled. Secure the other end around your __________ foot. 2. Sit on the floor, facing the object with your __________ leg extended. The band or tube should be slightly tense when your foot is relaxed. 3. Slowly flex your __________ ankle and toes to bring your foot toward your shin. 4. Hold this position for __________ seconds. 5. Slowly return your foot to the starting position, controlling the band as you do that. Repeat __________ times. Complete this exercise __________ times a day. Plantar flexors These are muscles that push your foot down. 1. Sit on the floor with your __________ leg extended. 2. Loop a rubber exercise band or tube around the ball of your __________ foot. The ball of your foot is on the walking surface, right under your toes. The band or tube should be slightly tense when your foot is relaxed. 3. Slowly point your toes downward, pushing them away from you. 4. Hold this position for __________ seconds. 5. Slowly release the tension in the band or tube, controlling smoothly until your foot is back in the starting position. Repeat __________ times. Complete this exercise __________ times a day. Towel curls  1. Sit in a chair on a non-carpeted surface, and put your feet on the floor. 2. Place a towel in front of your feet. If told by your health care provider, add a __________ pound weight to the end of the towel. 3. Keeping your heel on the floor, put your __________ foot on the towel. 4. Pull the towel toward you by grabbing the towel with your toes and curling them under. Keep your heel on the floor. 5. Let your toes relax. 6. Grab the towel again. Keep pulling the towel until it is completely underneath your foot. Repeat __________ times. Complete this exercise __________ times a day. Standing plantar flexion This is an exercise in which you use your toes to lift your body's weight while standing. 1. Stand with your feet  shoulder-width apart. 2. Keep your weight spread evenly over the width of your feet while you rise up on your toes. Use a wall or table to steady yourself if needed, but try not to use it for support. 3. If this exercise is too easy, try these options: ? Shift your weight toward your __________ leg until you feel challenged. ? If told by your health care provider, lift your uninjured leg off the floor. 4. Hold this position for __________ seconds. Repeat __________ times. Complete this exercise __________ times a day. Tandem walking 1. Stand with one foot directly in front of the other. 2. Slowly raise your back foot up, lifting your heel before your toes, and place it directly in front of your other foot. 3. Continue to walk in this heel-to-toe way for __________ or for as long as told by your health care provider. Have a countertop or wall nearby to use if needed to keep your balance, but try not to hold onto anything for support.  Repeat __________ times. Complete this exercise __________ times a day. This information is not intended to replace advice given to you by your health care provider. Make sure you discuss any questions you have with your health care provider. Document Released: 08/14/2005 Document Revised: 06/27/2018 Document Reviewed: 06/29/2018 Elsevier Patient Education  2020 [employer].

## 2019-04-28 NOTE — Progress Notes (Signed)
Subjective:     Patient ID: [name], female   DOB: [birth year], 38 y.o.   MRN: [id number]  [name] presents for recurrence of right ankle pain.  Reported that she has had this in the past and was treated with ibuprofen and may be prednisone.  Reports that this started last Saturday.  Comes and goes randomly.  Feels like a sharp stabbing pain predominantly when she is standing or walking on it.  Hurts to touch at times as well.  It gets a little bit better when she is not moving and she has a raised up.  There is no particular timing as to when or why it hurts.  She ranks it a 6 out of 10 pain today.  She denies any history of gout.  Reports that her grandmother has similar ankle issues and has had gout.  She denies any previous injury or trauma to the ankle.  Reports that she has been wearing sandals a lot.  Does not have any radiation.  Today patient denies signs and symptoms of COVID 19 infection including fever, chills, cough, shortness of breath, and headache.  Past Medical, Surgical, Social History, Allergies, and Medications have been Reviewed.   Past Medical History:  Diagnosis Date  . Morbid obesity (HCC)    Past Surgical History:  Procedure Laterality Date  . CESAREAN SECTION     x 3  . TUBAL LIGATION     Social History   Socioeconomic History  . Marital status: Married    Spouse name: Not on file  . Number of children: Not on file  . Years of education: Not on file  . Highest education level: Not on file  Occupational History  . Not on file  Social Needs  . Financial resource strain: Not on file  . Food insecurity    Worry: Not on file    Inability: Not on file  . Transportation needs    Medical: Not on file    Non-medical: Not on file  Tobacco Use  . Smoking status: Never Smoker  . Smokeless tobacco: Never Used  Substance and Sexual Activity  . Alcohol use: No  . Drug use: No  . Sexual activity: Not on file  Lifestyle  . Physical  activity    Days per week: Not on file    Minutes per session: Not on file  . Stress: Not on file  Relationships  . Social Musician on phone: Not on file    Gets together: Not on file    Attends religious service: Not on file    Active member of club or organization: Not on file    Attends meetings of clubs or organizations: Not on file    Relationship status: Not on file  . Intimate partner violence    Fear of current or ex partner: Not on file    Emotionally abused: Not on file    Physically abused: Not on file    Forced sexual activity: Not on file  Other Topics Concern  . Not on file  Social History Narrative  . Not on file    Outpatient Encounter Medications as of 04/28/2019  Medication Sig  . busPIRone (BUSPAR) 7.5 MG tablet Take 1 tablet (7.5 mg total) by mouth 3 (three) times daily.  . ergocalciferol (VITAMIN D2) 50000 units capsule Take 1 capsule (50,000 Units total) by mouth once a week. One capsule once weekly  . PARoxetine (PAXIL) 30  MG tablet Take 1 tablet (30 mg total) by mouth daily.  . phentermine (ADIPEX-P) 37.5 MG tablet Take 1 tablet (37.5 mg total) by mouth daily before breakfast.  . ibuprofen (ADVIL) 800 MG tablet Take 1 tablet (800 mg total) by mouth 2 (two) times daily.   No facility-administered encounter medications on file as of 04/28/2019.    Allergies  Allergen Reactions  . Hydrocodone Itching  . Penicillins Hives    Review of Systems  Constitutional: Negative for chills and fever.  HENT: Negative.   Eyes: Negative.   Respiratory: Negative for cough and shortness of breath.   Cardiovascular: Negative.   Gastrointestinal: Negative.   Endocrine: Negative.   Genitourinary: Negative.   Musculoskeletal: Positive for arthralgias and joint swelling.  Skin: Negative.   Allergic/Immunologic: Negative.   Neurological: Negative.   Hematological: Negative.   Psychiatric/Behavioral: Negative.   All other systems reviewed and are  negative.      Objective:     BP 122/82   Pulse 98   Temp (!) 97.1 F (36.2 C) (Temporal)   Ht 5' (1.524 m)   Wt (!) 307 lb (139.3 kg)   SpO2 95%   BMI 59.96 kg/m   Physical Exam Constitutional:      Appearance: Normal appearance. She is obese.  HENT:     Head: Normocephalic.     Right Ear: External ear normal.     Left Ear: External ear normal.     Nose: Nose normal.  Eyes:     Conjunctiva/sclera: Conjunctivae normal.  Neck:     Musculoskeletal: Normal range of motion and neck supple.  Cardiovascular:     Rate and Rhythm: Normal rate and regular rhythm.     Pulses: Normal pulses.     Heart sounds: Normal heart sounds.  Pulmonary:     Effort: Pulmonary effort is normal.     Breath sounds: Normal breath sounds.  Musculoskeletal: Normal range of motion.  Feet:     Right foot:     Skin integrity: Skin integrity normal.     Left foot:     Skin integrity: Skin integrity normal.     Comments: Fallen Arch noted on Right side Some swelling at the ankle noted bilaterally, more on Right than left Skin:    General: Skin is warm.  Neurological:     Mental Status: She is alert and oriented to person, place, and time.  Psychiatric:        Mood and Affect: Mood normal.        Behavior: Behavior normal.        Thought Content: Thought content normal.        Judgment: Judgment normal.        Assessment and Plan        1. Acute right ankle pain Possible fallen arch could be culprit of ankle weakness and pain.  Given RICE measures and written stretches for ankle strength NSAID for short term. Reviewed side effects, risks and benefits of medication.   NSAID precautions were reviewed with the patient.  Patient should take medication with food, discontinue if develops GI side effects, not to take other OTC NSAIDs at the same time, and not to use longer than recommended. Patient acknowledged agreement and understanding of the plan.    - ibuprofen (ADVIL) 800 MG tablet;  Take 1 tablet (800 mg total) by mouth 2 (two) times daily.  Dispense: 30 tablet; Refill: 1    Return if symptoms worsen or fail  to improve.        Freddy Finner, DNP, AGNP-BC [facility] [address] [place], [place] [address] Office Hours: Mon-Thurs 8 am-5 pm; Fri 8 am-12 pm Office Phone:  [phone]  Office Fax: [phone]
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
## 2019-05-05 ENCOUNTER — Encounter: Payer: Self-pay | Admitting: Family Medicine

## 2019-05-10 ENCOUNTER — Other Ambulatory Visit: Payer: Self-pay

## 2019-05-10 ENCOUNTER — Ambulatory Visit (INDEPENDENT_AMBULATORY_CARE_PROVIDER_SITE_OTHER): Payer: BC Managed Care – PPO | Admitting: Psychiatry

## 2019-05-10 ENCOUNTER — Encounter (HOSPITAL_COMMUNITY): Payer: Self-pay | Admitting: Psychiatry

## 2019-05-10 DIAGNOSIS — F3342 Major depressive disorder, recurrent, in full remission: Secondary | ICD-10-CM | POA: Diagnosis not present

## 2019-05-10 MED ORDER — BUSPIRONE HCL 7.5 MG PO TABS: 7.5000 mg | ORAL_TABLET | Freq: Three times a day (TID) | ORAL | 0 refills | Status: DC

## 2019-05-10 MED ORDER — PAROXETINE HCL 30 MG PO TABS: 30.0000 mg | ORAL_TABLET | Freq: Every day | ORAL | 0 refills | Status: DC

## 2019-05-10 NOTE — Patient Instructions (Signed)
1ContinuePaxil 30 mg daily  2. Continue Buspar 7.5 mg three timesa day  3.Next appointment: 7/27 at 1:20

## 2019-05-22 ENCOUNTER — Encounter: Payer: Self-pay | Admitting: Family Medicine

## 2019-05-24 ENCOUNTER — Encounter: Payer: BC Managed Care – PPO | Admitting: Family Medicine

## 2019-06-28 ENCOUNTER — Other Ambulatory Visit: Payer: Self-pay | Admitting: Family Medicine

## 2019-07-08 ENCOUNTER — Encounter: Payer: BC Managed Care – PPO | Admitting: Family Medicine

## 2019-07-26 ENCOUNTER — Ambulatory Visit (INDEPENDENT_AMBULATORY_CARE_PROVIDER_SITE_OTHER): Payer: BC Managed Care – PPO | Admitting: Family Medicine

## 2019-07-26 ENCOUNTER — Encounter: Payer: Self-pay | Admitting: Family Medicine

## 2019-07-26 ENCOUNTER — Other Ambulatory Visit (HOSPITAL_COMMUNITY)
Admission: RE | Admit: 2019-07-26 | Discharge: 2019-07-26 | Disposition: A | Discharge status: Home / Self Care | Payer: BC Managed Care – PPO | Source: Ambulatory Visit | Attending: Family Medicine | Admitting: Family Medicine

## 2019-07-26 ENCOUNTER — Other Ambulatory Visit: Payer: Self-pay

## 2019-07-26 VITALS — BP 122/84 | HR 111 | Temp 97.2°F | Resp 15 | Ht 60.0 in | Wt 312.0 lb

## 2019-07-26 DIAGNOSIS — Z124 Encounter for screening for malignant neoplasm of cervix: Secondary | ICD-10-CM

## 2019-07-26 DIAGNOSIS — Z23 Encounter for immunization: Secondary | ICD-10-CM | POA: Diagnosis not present

## 2019-07-26 DIAGNOSIS — M25571 Pain in right ankle and joints of right foot: Secondary | ICD-10-CM

## 2019-07-26 DIAGNOSIS — E8881 Metabolic syndrome: Secondary | ICD-10-CM

## 2019-07-26 DIAGNOSIS — Z0000 Encounter for general adult medical examination without abnormal findings: Secondary | ICD-10-CM

## 2019-07-26 DIAGNOSIS — E785 Hyperlipidemia, unspecified: Secondary | ICD-10-CM

## 2019-07-26 DIAGNOSIS — E559 Vitamin D deficiency, unspecified: Secondary | ICD-10-CM

## 2019-07-26 DIAGNOSIS — G8929 Other chronic pain: Secondary | ICD-10-CM

## 2019-07-26 DIAGNOSIS — M25561 Pain in right knee: Secondary | ICD-10-CM

## 2019-07-26 DIAGNOSIS — R7303 Prediabetes: Secondary | ICD-10-CM

## 2019-07-26 MED ORDER — PHENTERMINE HCL 37.5 MG PO TABS: ORAL_TABLET | ORAL | 3 refills | Status: DC

## 2019-07-26 NOTE — Progress Notes (Signed)
[name]     MRN: [id number]      DOB: 05/20/1981  HPI: Patient is in for annual physical exam. Chronic and worsening right ankle and knee painif needed.   PE: BP 122/84   Pulse (!) 111   Temp (!) 97.2 F (36.2 C) (Temporal)   Resp 15   Ht 5' (1.524 m)   Wt (!) 312 lb (141.5 kg)   SpO2 96%   BMI 60.93 kg/m     Pleasant  female, alert and oriented x 3, in no cardio-pulmonary distress. Afebrile. HEENT No facial trauma or asymetry. Sinuses non tender.  Extra occullar muscles intact.. External ears normal, . Neck: supple, no adenopathy,JVD or thyromegaly.No bruits.  Chest: Clear to ascultation bilaterally.No crackles or wheezes. Non tender to palpation  Breast: No asymetry,no masses or lumps. No tenderness. No nipple discharge or inversion. No axillary or supraclavicular adenopathy  Cardiovascular system; Heart sounds normal,  S1 and  S2 ,no S3.  No murmur, or thrill. Apical beat not displaced Peripheral pulses normal.  Abdomen: Soft, non tender, no organomegaly or masses. No bruits. Bowel sounds normal. No guarding, tenderness or rebound.  GU: External genitalia normal female genitalia , normal female distribution of hair. No lesions. Urethral meatus normal in size, no  Prolapse, no lesions visibly  Present. Bladder non tender. Vagina pink and moist , with no visible lesions , discharge present . Adequate pelvic support no  cystocele or rectocele noted Cervix pink and appears healthy, no lesions or ulcerations noted, no discharge noted from os Uterus normal size, no adnexal masses, no cervical motion or adnexal tenderness.   Musculoskeletal exam: Full ROM of spine, hips , shoulders and reduced in right knee  and right ankle  deformity ,swelling and  crepitus noted in right knee No muscle wasting or atrophy.   Neurologic: Cranial nerves 2 to 12 intact. Power, tone ,sensation and reflexes normal throughout.  disturbance in gait. No tremor.  Skin: Intact, no ulceration, erythema , scaling or rash noted. Pigmentation normal throughout  Psych; Normal mood and affect. Judgement and concentration normal   Assessment & Plan:  Annual physical exam Annual exam as documented. Counseling done  re healthy lifestyle involving commitment to 150 minutes exercise per week, heart healthy diet, and attaining healthy weight.The importance of adequate sleep also discussed. Regular seat belt use and home safety, is also discussed. Changes in health habits are decided on by the patient with goals and time frames  set for achieving them. Immunization and cancer screening needs are specifically addressed at this visit.   MORBID OBESITY  Patient re-educated about  the importance of commitment to a  minimum of 150 minutes of exercise per week as able.  The importance of healthy food choices with portion control discussed, as well as eating regularly and within a 12 hour window most days. The need to choose "clean , green" food 50 to 75% of the time is discussed, as well as to make water the primary drink and set a goal of 64 ounces water daily.    Weight /BMI 07/26/2019 04/28/2019 12/08/2018  WEIGHT 312 lb 307 lb 297 lb  HEIGHT 5\' 0"  5\' 0"  5\' 0"   BMI 60.93 kg/m2 59.96 kg/m2 58 kg/m2  Some encounter information is confidential and restricted. Go to Review Flowsheets activity to see all data.      Ankle pain, right 5 month h/o increased ankle pain, rated at 8 , has used anti inflammatory in the past  with relief, no relief with anti inflammatory, Ortho eval  Knee pain, right 5 year h/o intermittent right knee pain trauma was the initial  Trigger, she has fallen twice Knee pain flares to a 6 when abn=nkle hurts
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
## 2019-07-26 NOTE — Assessment & Plan Note (Signed)
5 month h/o increased ankle pain, rated at 8 , has used anti inflammatory in the past with relief, no relief with anti inflammatory, Ortho eval

## 2019-07-26 NOTE — Assessment & Plan Note (Signed)
  Patient re-educated about  the importance of commitment to a  minimum of 150 minutes of exercise per week as able.  The importance of healthy food choices with portion control discussed, as well as eating regularly and within a 12 hour window most days. The need to choose "clean , green" food 50 to 75% of the time is discussed, as well as to make water the primary drink and set a goal of 64 ounces water daily.    Weight /BMI 07/26/2019 04/28/2019 12/08/2018  WEIGHT 312 lb 307 lb 297 lb  HEIGHT 5\' 0"  5\' 0"  5\' 0"   BMI 60.93 kg/m2 59.96 kg/m2 58 kg/m2  Some encounter information is confidential and restricted. Go to Review Flowsheets activity to see all data.

## 2019-07-26 NOTE — Patient Instructions (Addendum)
F/U in 4 month, call if you need me sooner  Flu vaccine today  Pap sent today   Labs today, CBC, lipid, cmp and EGFr, HBa1C, TSH and vit D  Resume half phentermine daily Stop sweets and soda  Goal of 20 pounds or more weight loss   1500 cal diet  Think about what you will eat, plan ahead. Choose " clean, green, fresh or frozen" over canned, processed or packaged foods which are more sugary, salty and fatty. 70 to 75% of food eaten should be vegetables and fruit. Three meals at set times with snacks allowed between meals, but they must be fruit or vegetables. Aim to eat over a 12 hour period , example 7 am to 7 pm, and STOP after  your last meal of the day. Drink water,generally about 64 ounces per day, no other drink is as healthy. Fruit juice is best enjoyed in a healthy way, by EATING the fruit.

## 2019-07-26 NOTE — Assessment & Plan Note (Signed)

## 2019-07-27 ENCOUNTER — Encounter: Payer: Self-pay | Admitting: Family Medicine

## 2019-07-27 LAB — COMPLETE METABOLIC PANEL WITH GFR
AG Ratio: 1.3 (calc) (ref 1.0–2.5)
ALT: 14 U/L (ref 6–29)
AST: 15 U/L (ref 10–30)
Albumin: 4 g/dL (ref 3.6–5.1)
Alkaline phosphatase (APISO): 59 U/L (ref 31–125)
BUN: 17 mg/dL (ref 7–25)
CO2: 25 mmol/L (ref 20–32)
Calcium: 8.9 mg/dL (ref 8.6–10.2)
Chloride: 104 mmol/L (ref 98–110)
Creat: 0.97 mg/dL (ref 0.50–1.10)
GFR, Est African American: 86 mL/min/{1.73_m2} (ref >=60)
GFR, Est Non African American: 74 mL/min/{1.73_m2} (ref >=60)
Globulin: 3.2 g/dL (calc) (ref 1.9–3.7)
Glucose, Bld: 92 mg/dL (ref 65–139)
Potassium: 4.1 mmol/L (ref 3.5–5.3)
Sodium: 137 mmol/L (ref 135–146)
Total Bilirubin: 0.4 mg/dL (ref 0.2–1.2)
Total Protein: 7.2 g/dL (ref 6.1–8.1)

## 2019-07-27 LAB — LIPID PANEL
Cholesterol: 154 mg/dL (ref <200)
HDL: 45 mg/dL — ABNORMAL LOW (ref >=50)
LDL Cholesterol (Calc): 93 mg/dL (calc)
Non-HDL Cholesterol (Calc): 109 mg/dL (calc) (ref <130)
Total CHOL/HDL Ratio: 3.4 (calc) (ref <5.0)
Triglycerides: 75 mg/dL (ref <150)

## 2019-07-27 LAB — CBC
HCT: 36.9 % (ref 35.0–45.0)
Hemoglobin: 11.5 g/dL — ABNORMAL LOW (ref 11.7–15.5)
MCH: 26.3 pg — ABNORMAL LOW (ref 27.0–33.0)
MCHC: 31.2 g/dL — ABNORMAL LOW (ref 32.0–36.0)
MCV: 84.2 fL (ref 80.0–100.0)
MPV: 10.9 fL (ref 7.5–12.5)
Platelets: 345 10*3/uL (ref 140–400)
RBC: 4.38 10*6/uL (ref 3.80–5.10)
RDW: 13.1 % (ref 11.0–15.0)
WBC: 7.3 10*3/uL (ref 3.8–10.8)

## 2019-07-27 LAB — VITAMIN D 25 HYDROXY (VIT D DEFICIENCY, FRACTURES): Vit D, 25-Hydroxy: 29 ng/mL — ABNORMAL LOW (ref 30–100)

## 2019-07-27 LAB — TSH: TSH: 0.83 mIU/L

## 2019-07-27 LAB — HEMOGLOBIN A1C
Hgb A1c MFr Bld: 6.1 % of total Hgb — ABNORMAL HIGH (ref <5.7)
Mean Plasma Glucose: 128 (calc)
eAG (mmol/L): 7.1 (calc)
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
## 2019-08-02 ENCOUNTER — Ambulatory Visit (INDEPENDENT_AMBULATORY_CARE_PROVIDER_SITE_OTHER): Payer: BC Managed Care – PPO | Admitting: Psychiatry

## 2019-08-02 ENCOUNTER — Encounter (HOSPITAL_COMMUNITY): Payer: Self-pay | Admitting: Psychiatry

## 2019-08-02 ENCOUNTER — Other Ambulatory Visit: Payer: Self-pay

## 2019-08-02 DIAGNOSIS — F3341 Major depressive disorder, recurrent, in partial remission: Secondary | ICD-10-CM

## 2019-08-02 MED ORDER — SERTRALINE HCL 50 MG PO TABS: ORAL_TABLET | ORAL | 2 refills | Status: DC

## 2019-08-02 MED ORDER — BUSPIRONE HCL 7.5 MG PO TABS: 7.5000 mg | ORAL_TABLET | Freq: Three times a day (TID) | ORAL | 0 refills | Status: DC

## 2019-08-02 NOTE — Patient Instructions (Signed)
1  Decrease Paxil 15 mg daily for one week, then discontinue  2. Start sertraline 25 mg daily for one week, then 50 mg daily  2. Continue Buspar 7.5 mg three timesa day  3.Next appointment: in [place]

## 2019-08-04 ENCOUNTER — Encounter: Payer: Self-pay | Admitting: Family Medicine

## 2019-08-04 LAB — CYTOLOGY - PAP
Adequacy: ABSENT
Comment: NEGATIVE
Diagnosis: NEGATIVE
High risk HPV: NEGATIVE

## 2019-08-23 ENCOUNTER — Ambulatory Visit: Payer: BC Managed Care – PPO | Admitting: Orthopedic Surgery

## 2019-08-30 ENCOUNTER — Other Ambulatory Visit: Payer: Self-pay | Admitting: Family Medicine

## 2019-09-14 ENCOUNTER — Other Ambulatory Visit: Payer: Self-pay

## 2019-09-14 DIAGNOSIS — Z20822 Contact with and (suspected) exposure to covid-19: Secondary | ICD-10-CM

## 2019-09-16 LAB — NOVEL CORONAVIRUS, NAA: SARS-CoV-2, NAA: NOT DETECTED

## 2019-10-04 ENCOUNTER — Ambulatory Visit: Payer: BC Managed Care – PPO | Admitting: Orthopedic Surgery

## 2019-10-04 ENCOUNTER — Ambulatory Visit: Payer: BC Managed Care – PPO

## 2019-10-04 ENCOUNTER — Encounter: Payer: Self-pay | Admitting: Orthopedic Surgery

## 2019-10-04 ENCOUNTER — Other Ambulatory Visit: Payer: Self-pay

## 2019-10-04 VITALS — BP 130/73 | HR 90 | Temp 98.0°F | Ht 60.0 in | Wt 311.0 lb

## 2019-10-04 DIAGNOSIS — M25571 Pain in right ankle and joints of right foot: Secondary | ICD-10-CM

## 2019-10-04 DIAGNOSIS — G8929 Other chronic pain: Secondary | ICD-10-CM

## 2019-10-04 DIAGNOSIS — M1711 Unilateral primary osteoarthritis, right knee: Secondary | ICD-10-CM | POA: Diagnosis not present

## 2019-10-04 DIAGNOSIS — M25561 Pain in right knee: Secondary | ICD-10-CM | POA: Diagnosis not present

## 2019-10-04 DIAGNOSIS — M76821 Posterior tibial tendinitis, right leg: Secondary | ICD-10-CM

## 2019-10-04 DIAGNOSIS — M1710 Unilateral primary osteoarthritis, unspecified knee: Secondary | ICD-10-CM

## 2019-10-04 DIAGNOSIS — Z6841 Body Mass Index (BMI) 40.0 and over, adult: Secondary | ICD-10-CM

## 2019-10-04 MED ORDER — MELOXICAM 7.5 MG PO TABS: 7.5000 mg | ORAL_TABLET | Freq: Every day | ORAL | 5 refills | Status: DC

## 2019-10-04 NOTE — Progress Notes (Signed)
[name]  10/04/2019  Body mass index is 60.74 kg/m.   HISTORY SECTION :  Chief Complaint  Patient presents with  . Knee Pain    Right knee pain and right ankle pain, referred by Dr. Lodema Hong.  . Ankle Pain   38 year old female with BMI of 60 currently on Adipex presents for evaluation of 2-year history of right knee pain and 2-year history of right ankle pain  She has been on anti-inflammatories but no other treatment regarding these 2 issues.  She has previously taken prednisone and ibuprofen but that was 2 years ago  She has medial ankle pain flattening of the arch with no lateral discomfort at this time.  She has pain along the medial joint line of the right knee with difficulty ambulating and increased pain with ambulation  Denies any history of trauma.  Her pain can go up to a level of 6-8     Review of Systems  Musculoskeletal: Positive for joint pain.  All other systems reviewed and are negative.    Past Medical History:  Diagnosis Date  . Morbid obesity (HCC)       has a past medical history of Morbid obesity (HCC).   Past Surgical History:  Procedure Laterality Date  . CESAREAN SECTION     x 3  . TUBAL LIGATION      Body mass index is 60.74 kg/m.   Allergies  Allergen Reactions  . Hydrocodone Itching  . Penicillins Hives     Current Outpatient Medications:  .  busPIRone (BUSPAR) 7.5 MG tablet, Take 1 tablet (7.5 mg total) by mouth 3 (three) times daily., Disp: 270 tablet, Rfl: 0 .  ibuprofen (ADVIL) 800 MG tablet, Take 1 tablet (800 mg total) by mouth 2 (two) times daily., Disp: 30 tablet, Rfl: 1 .  meloxicam (MOBIC) 7.5 MG tablet, Take 1 tablet (7.5 mg total) by mouth daily., Disp: 30 tablet, Rfl: 5 .  PARoxetine (PAXIL) 30 MG tablet, Take 1 tablet (30 mg total) by mouth daily., Disp: 90 tablet, Rfl: 0 .  phentermine (ADIPEX-P) 37.5 MG tablet, Take half tablet once daily every morning with breakfast, Disp: 15 tablet, Rfl: 3 .   sertraline (ZOLOFT) 50 MG tablet, 25 mg daily for one week, then 50 mg daily, Disp: 30 tablet, Rfl: 2 .  Vitamin D, Ergocalciferol, (DRISDOL) 1.25 MG (50000 UT) CAPS capsule, Take 1 capsule by mouth once a week, Disp: 4 capsule, Rfl: 0   PHYSICAL EXAM SECTION: 1) BP 130/73   Pulse 90   Temp 98 F (36.7 C)   Ht 5' (1.524 m)   Wt (!) 311 lb (141.1 kg)   BMI 60.74 kg/m   Body mass index is 60.74 kg/m. General appearance: Well-developed well-nourished no gross deformities  2) Cardiovascular normal pulse and perfusion in the lower extremities normal color without edema  3) Neurologically deep tendon reflexes are equal and normal, no sensation loss or deficits no pathologic reflexes  4) Psychological: Awake alert and oriented x3 mood and affect normal  5) Skin no lacerations or ulcerations no nodularity no palpable masses, no erythema or nodularity  6) Musculoskeletal:   Right ankle Tenderness on the medial malleolar area and posterior to that with a flexible flatfoot mild heel valgus which corrects, no lateral tenderness Ankle motion is normal Ankle is stable on drawer test subtalar motion feels good Posterior tibial tendon weakness Skin intact  Right knee Tenderness medial joint line Flexion limited by shape and adipose tissue Ligaments  feel stable in extension and flexion Skin is normal Quadriceps muscle tone is excellent  Left ankle is also flat but nontender normal motion stable drawer test skin intact   The patient meets the AMA guidelines for Morbid (severe) obesity with a BMI > 40.0 and I have recommended weight loss.  MEDICAL DECISION SECTION:  Encounter Diagnoses  Name Primary?  . Chronic pain of right knee   . Pain in right ankle and joints of right foot   . Body mass index 60.0-69.9, adult ([place])   . Morbid obesity ([place])   . Posterior tibial tendon dysfunction (PTTD) of right lower extremity   . Primary localized osteoarthritis of knee Yes     Imaging X-ray was done of the ankle and the knee see the dictated reports the right knee is in 6 degrees of varus the x-ray under estimates the amount of degenerative arthritis and narrowing in this compartment medially however.  Right ankle shows severe flattening of the calcaneal metatarsal angle  Mild degenerative changes are seen in the posterior ankle joint  Wounds Plan:  (Rx., Inj., surg., Frx, MRI/CT, XR:2)  Procedure note right knee injection   verbal consent was obtained to inject right knee joint  Timeout was completed to confirm the site of injection  The medications used were 40 mg of Depo-Medrol and 1% lidocaine 3 cc  Anesthesia was provided by ethyl chloride and the skin was prepped with alcohol.  Modified injection technique was done.  A 25-gauge needle was used to go directly through the patellar tendon due to the shape and size of the knee joint.  This was well-tolerated  Plan Problem #1 arthritis right knee  Treatment plan weight loss, arthritis medication, injection   Problem #2 posterior tibial tendon disorder  Treatment plan ankle-foot orthosis to be picked up from [facility], physical therapy, arthritis medication will serve as a anti-inflammatory for the tendon  FU prn   10:09 AM Arther Abbott, MD  10/04/2019

## 2019-10-04 NOTE — Patient Instructions (Addendum)
Problem #1 arthritis right knee  Treatment plan weight loss, arthritis medication, injection   Problem #2 posterior tibial tendon disorder  Treatment plan ankle-foot orthosis to be picked up from [facility], physical therapy, arthritis medication will serve as a anti-inflammatory for the tendon    Osteoarthritis  Osteoarthritis is a type of arthritis that affects tissue that covers the ends of bones in joints (cartilage). Cartilage acts as a cushion between the bones and helps them move smoothly. Osteoarthritis results when cartilage in the joints gets worn down. Osteoarthritis is sometimes called "wear and tear" arthritis. Osteoarthritis is the most common form of arthritis. It often occurs in older people. It is a condition that gets worse over time (a progressive condition). Joints that are most often affected by this condition are in:  Fingers.  Toes.  Hips.  Knees.  Spine, including neck and lower back. What are the causes? This condition is caused by age-related wearing down of cartilage that covers the ends of bones. What increases the risk? The following factors may make you more likely to develop this condition:  Older age.  Being overweight or obese.  Overuse of joints, such as in athletes.  Past injury of a joint.  Past surgery on a joint.  Family history of osteoarthritis. What are the signs or symptoms? The main symptoms of this condition are pain, swelling, and stiffness in the joint. The joint may lose its shape over time. Small pieces of bone or cartilage may break off and float inside of the joint, which may cause more pain and damage to the joint. Small deposits of bone (osteophytes) may grow on the edges of the joint. Other symptoms may include:  A grating or scraping feeling inside the joint when you move it.  Popping or creaking sounds when you move. Symptoms may affect one or more joints. Osteoarthritis in a major joint, such as your knee or hip, can  make it painful to walk or exercise. If you have osteoarthritis in your hands, you might not be able to grip items, twist your hand, or control small movements of your hands and fingers (fine motor skills). How is this diagnosed? This condition may be diagnosed based on:  Your medical history.  A physical exam.  Your symptoms.  X-rays of the affected joint(s).  Blood tests to rule out other types of arthritis. How is this treated? There is no cure for this condition, but treatment can help to control pain and improve joint function. Treatment plans may include:  A prescribed exercise program that allows for rest and joint relief. You may work with a physical therapist.  A weight control plan.  Pain relief techniques, such as: ? Applying heat and cold to the joint. ? Electric pulses delivered to nerve endings under the skin (transcutaneous electrical nerve stimulation, or TENS). ? Massage. ? Certain nutritional supplements.  NSAIDs or prescription medicines to help relieve pain.  Medicine to help relieve pain and inflammation (corticosteroids). This can be given by mouth (orally) or as an injection.  Assistive devices, such as a brace, wrap, splint, specialized glove, or cane.  Surgery, such as: ? An osteotomy. This is done to reposition the bones and relieve pain or to remove loose pieces of bone and cartilage. ? Joint replacement surgery. You may need this surgery if you have very bad (advanced) osteoarthritis. Follow these instructions at home: Activity  Rest your affected joints as directed by your health care provider.  Do not drive or use heavy  machinery while taking prescription pain medicine.  Exercise as directed. Your health care provider or physical therapist may recommend specific types of exercise, such as: ? Strengthening exercises. These are done to strengthen the muscles that support joints that are affected by arthritis. They can be performed with weights or  with exercise bands to add resistance. ? Aerobic activities. These are exercises, such as brisk walking or water aerobics, that get your heart pumping. ? Range-of-motion activities. These keep your joints easy to move. ? Balance and agility exercises. Managing pain, stiffness, and swelling      If directed, apply heat to the affected area as often as told by your health care provider. Use the heat source that your health care provider recommends, such as a moist heat pack or a heating pad. ? If you have a removable assistive device, remove it as told by your health care provider. ? Place a towel between your skin and the heat source. If your health care provider tells you to keep the assistive device on while you apply heat, place a towel between the assistive device and the heat source. ? Leave the heat on for 20-30 minutes. ? Remove the heat if your skin turns bright red. This is especially important if you are unable to feel pain, heat, or cold. You may have a greater risk of getting burned.  If directed, put ice on the affected joint: ? If you have a removable assistive device, remove it as told by your health care provider. ? Put ice in a plastic bag. ? Place a towel between your skin and the bag. If your health care provider tells you to keep the assistive device on during icing, place a towel between the assistive device and the bag. ? Leave the ice on for 20 minutes, 2-3 times a day. General instructions  Take over-the-counter and prescription medicines only as told by your health care provider.  Maintain a healthy weight. Follow instructions from your health care provider for weight control. These may include dietary restrictions.  Do not use any products that contain nicotine or tobacco, such as cigarettes and e-cigarettes. These can delay bone healing. If you need help quitting, ask your health care provider.  Use assistive devices as directed by your health care  provider.  Keep all follow-up visits as told by your health care provider. This is important. Where to find more information  [employer] of Arthritis and Musculoskeletal and Skin Diseases: www.niams.http://www.myers.net/  [employer] on Aging: https://walker.com/  American College of Rheumatology: www.rheumatology.org Contact a health care provider if:  Your skin turns red.  You develop a rash.  You have pain that gets worse.  You have a fever along with joint or muscle aches. Get help right away if:  You lose a lot of weight.  You suddenly lose your appetite.  You have night sweats. Summary  Osteoarthritis is a type of arthritis that affects tissue covering the ends of bones in joints (cartilage).  This condition is caused by age-related wearing down of cartilage that covers the ends of bones.  The main symptom of this condition is pain, swelling, and stiffness in the joint.  There is no cure for this condition, but treatment can help to control pain and improve joint function. This information is not intended to replace advice given to you by your health care provider. Make sure you discuss any questions you have with your health care provider. Document Released: 09/30/2005 Document Revised: 09/12/2017 Document Reviewed:  06/03/2016 Elsevier Patient Education  2020 [employer]  Posterior Tibial Tendinitis Posterior tibial tendinitis is irritation of a tendon called the posterior tibial tendon. Your posterior tibial tendon is a cord-like tissue that connects bones of your lower leg and foot to a muscle that:  Supports your arch.  Helps you raise up on your toes.  Helps you turn your foot down and in. This condition causes foot and ankle pain. It can also lead to a flat foot. What are the causes? This condition is most often caused by repeated stress to the tendon (overuse injury). It can also be caused by a sudden injury that stresses the tendon, such as landing on  your foot after jumping or falling. What increases the risk? This condition is more likely to develop in:  People who play a sport that involves putting a lot of pressure on the feet, such as: ? Basketball. ? Tennis. ? Soccer. ? Hockey.  Runners.  Females who are older than 38 years of age and are overweight.  People with diabetes.  People with decreased foot stability.  People with flat feet. What are the signs or symptoms? Symptoms include:  Pain in the inner ankle.  Pain at the arch of your foot.  Pain that gets worse with running, walking, or standing.  Swelling on the inside of your ankle and foot.  Weakness in your ankle or foot.  Inability to stand up on tiptoe.  Flattening of the arch of your foot. How is this diagnosed? This condition may be diagnosed based on:  Your symptoms.  Your medical history.  A physical exam.  Tests, such as: ? X-ray. ? MRI. ? Ultrasound. How is this treated? This condition may be treated by:  Putting ice to the injured area.  Taking NSAIDs, such as ibuprofen, to reduce pain and swelling.  Wearing a special shoe or shoe insert to support your arch (orthotic).  Having physical therapy.  Replacing high-impact exercise with low-impact exercise, such as swimming or cycling. If your symptoms do not improve with these treatments, you may need to wear a splint, removable walking boot, or short leg cast for 6-8 weeks to keep your foot and ankle still (immobilized). Follow these instructions at home: If you have a cast, splint, or boot:  Keep it clean and dry.  Check the skin around it every day. Tell your health care provider about any concerns. If you have a cast:  Do not stick anything inside it to scratch your skin. Doing that increases your risk of infection.  You may put lotion on dry skin around the edges of the cast. Do not put lotion on the skin underneath the cast. If you have a splint or boot:  Wear it as  told by your health care provider. Remove it only as told by your health care provider.  Loosen it if your toes tingle, become numb, or turn cold and blue. Bathing  Do not take baths, swim, or use a hot tub until your health care provider approves. Ask your health care provider if you may take showers.  If your cast, splint, or boot is not waterproof: ? Do not let it get wet. ? Cover it with a waterproof covering while you take a bath or a shower. Managing pain and swelling   If directed, put ice on the injured area. ? If you have a removable splint or boot, remove it as told by your health care provider. ? Put ice in a  plastic bag. ? Place a towel between your skin and the bag or between your cast and the bag. ? Leave the ice on for 20 minutes, 2-3 times a day.  Move your toes often to reduce stiffness and swelling.  Raise (elevate) the injured area above the level of your heart while you are sitting or lying down. Activity  Do not use the injured foot to support your body weight until your health care provider says that you can. Use crutches as told by your health care provider.  Do not do activities that make pain or swelling worse.  Ask your health care provider when it is safe to drive if you have a cast, splint, or boot on your foot.  Return to your normal activities as told by your health care provider. Ask your health care provider what activities are safe for you.  Do exercises as told by your health care provider. General instructions  Take over-the-counter and prescription medicines only as told by your health care provider.  If you have an orthotic, use it as told by your health care provider.  Keep all follow-up visits as told by your health care provider. This is important. How is this prevented?  Wear footwear that is appropriate to your athletic activity.  Avoid athletic activities that cause pain or swelling in your ankle or foot.  Before being active,  do range-of-motion and stretching exercises.  If you develop pain or swelling while training, stop training.  If you have pain or swelling that does not improve after a few days of rest, see your health care provider.  If you start a new athletic activity, start gradually so you can build up your strength and flexibility. Contact a health care provider if:  Your symptoms get worse.  Your symptoms do not improve in 6-8 weeks.  You develop new, unexplained symptoms.  Your splint, boot, or cast gets damaged. Summary  Posterior tibial tendinitis is irritation of a tendon called the posterior tibial tendon.  This condition is most often caused by repeated stress to the tendon (overuse injury).  This condition causes foot pain and ankle pain. It can also lead to a flat foot.  This condition may be treated by not doing high-impact activities, applying ice, having physical therapy, wearing orthotics, and wearing a cast, splint, or boot if needed. This information is not intended to replace advice given to you by your health care provider. Make sure you discuss any questions you have with your health care provider. Document Released: 09/30/2005 Document Revised: 01/26/2019 Document Reviewed: 12/03/2018 Elsevier Patient Education  2020 [employer].

## 2019-10-26 ENCOUNTER — Other Ambulatory Visit: Payer: Self-pay | Admitting: Family Medicine

## 2019-11-02 ENCOUNTER — Ambulatory Visit (HOSPITAL_COMMUNITY): Payer: BC Managed Care – PPO | Admitting: Psychiatry

## 2019-11-05 ENCOUNTER — Other Ambulatory Visit: Payer: Self-pay

## 2019-11-05 ENCOUNTER — Encounter (HOSPITAL_COMMUNITY): Payer: Self-pay | Admitting: Physical Therapy

## 2019-11-05 ENCOUNTER — Ambulatory Visit (HOSPITAL_COMMUNITY): Payer: BC Managed Care – PPO | Attending: Orthopedic Surgery | Admitting: Physical Therapy

## 2019-11-05 DIAGNOSIS — M25561 Pain in right knee: Secondary | ICD-10-CM | POA: Diagnosis present

## 2019-11-05 DIAGNOSIS — G8929 Other chronic pain: Secondary | ICD-10-CM

## 2019-11-05 DIAGNOSIS — M25571 Pain in right ankle and joints of right foot: Secondary | ICD-10-CM | POA: Insufficient documentation

## 2019-11-05 DIAGNOSIS — R262 Difficulty in walking, not elsewhere classified: Secondary | ICD-10-CM | POA: Diagnosis present

## 2019-11-05 NOTE — Therapy (Signed)
[facility] [address] [place], [place], [address] Phone: [phone]   Fax:  [phone]  Physical Therapy Evaluation  Patient Details  Name: [name] MRN: [id number] Date of Birth: [birth year] Referring Provider (PT):  Fuller Canada    Encounter Date: 11/05/2019  PT End of Session - 11/05/19 1005    Visit Number  1    Number of Visits  6    Date for PT Re-Evaluation  12/17/19    Authorization Type  [employer], VL 30, No auth    Authorization Time Period  POC dates 11/05/19 to 12/17/2019    Authorization - Visit Number  1    Authorization - Number of Visits  30    PT Start Time  1006    PT Stop Time  1045    PT Time Calculation (min)  39 min    Activity Tolerance  Patient limited by pain    Behavior During Therapy  [facility] for tasks assessed/performed       Past Medical History:  Diagnosis Date  . Morbid obesity (HCC)     Past Surgical History:  Procedure Laterality Date  . CESAREAN SECTION     x 3  . TUBAL LIGATION      There were no vitals filed for this visit.   Subjective Assessment - 11/05/19 1357    Subjective  Patient complains of chronic right knee and ankle pain. States that it has been going on for at least 2 years but she can't think of a reason why it started. Over the last 2 years it has progressively worsened. States she saw the MD who performed xrays and said she had arthritis and flat feet with her right foot more so then the left. States he also recommended an orthotic and she went to [facility] to get fitted and she also paid for the orthotic but is currently waiting on it to arrive. States that she mostly has pain with walking and standing but she does have pain with sititng too. Pain in her knee is at the joint line and at the ankle it is along the inside and is tender to touch. Reports her ankle swells but doesn't notice much swelling today.    Pertinent History  chronic knee and ankle pain,  obesity, depression    Limitations  Sitting;Standing;Walking;House hold activities    How long can you stand comfortably?  not long    How long can you walk comfortably?  5 minutes    Patient Stated Goals  to have less pain    Currently in Pain?  Yes    Pain Score  2     Pain Location  Ankle   and knee   Pain Orientation  Right    Pain Descriptors / Indicators  Aching    Pain Type  Chronic pain    Pain Onset  More than a month ago    Pain Frequency  Intermittent    Aggravating Factors   walking, standing    Pain Relieving Factors  medication    Effect of Pain on Daily Activities  limits ability to perform daily tasks         [facility] PT Assessment - 11/05/19 0001      Assessment   Medical Diagnosis  posterior tibial tendon dysfunction    Referring Provider (PT)   Fuller Canada     Prior Therapy  no      Precautions   Precautions  None      Restrictions   Weight Bearing Restrictions  No      Balance Screen   Has the patient fallen in the past 6 months  No      [place] residence    Living Arrangements  Children;Spouse/significant other    Available Help at Discharge  Family    Type of [place] to enter    Entrance Stairs-Number of Steps  6      Prior Function   Level of Independence  Independent    Vocation  Full time employment    Vocation Requirements  sitting at desk for most of shift      Cognition   Overall Cognitive Status  Within Functional Limits for tasks assessed      Observation/Other Assessments   Focus on Therapeutic Outcomes (FOTO)   47% limited      Observation/Other Assessments-Edema    Edema  Figure 8;Circumferential      Circumferential Edema   Circumferential - Right  26.5cm   at ankle   Circumferential - Left   26.0cm   at ankle     Figure 8 Edema   Figure 8 - Right   53.6cm     Figure 8 - Left   53.0       ROM / Strength   AROM / PROM / Strength  AROM;Strength       AROM   AROM Assessment Site  Ankle;Knee    Right/Left Knee  Left;Right    Right/Left Ankle  Left;Right    Right Ankle Dorsiflexion  2    Right Ankle Plantar Flexion  55   painful along medial ankle   Left Ankle Dorsiflexion  5    Left Ankle Plantar Flexion  60      Strength   Strength Assessment Site  Ankle;Knee;Hip    Right/Left Hip  Right;Left    Right Hip Flexion  4+/5    Left Hip Flexion  4+/5    Right/Left Knee  Left;Right    Right Knee Flexion  4/5    Right Knee Extension  4+/5    Left Knee Flexion  4/5    Left Knee Extension  4+/5    Right/Left Ankle  Right;Left    Right Ankle Dorsiflexion  4/5    Right Ankle Plantar Flexion  2+/5   unable to complete one heel raise through full ROM    Right Ankle Inversion  4/5   pain along medial ankle   Right Ankle Eversion  4+/5    Left Ankle Dorsiflexion  5/5    Left Ankle Plantar Flexion  2+/5   unable to complete one heel raise through full ROM    Left Ankle Inversion  4+/5    Left Ankle Eversion  4+/5      Palpation   Palpation comment  tenderness to palpation along right medial calf, ankle and along joint line of right knee and right distal adductors      Balance   Balance Assessed  Yes      Static Standing Balance   Static Standing - Comment/# of Minutes  SLS L 15 second pain in left ankle - under arch, R 11 second and pain in the arch; tandem 30 sec with left posterior and 10 second with right posterior, pain in the right knee with right leg posterior  Objective measurements completed on examination: See above findings.              PT Education - 11/05/19 1350    Education Details  on current condition, findings and plan for PT    Person(s) Educated  Patient    Methods  Explanation;Demonstration    Comprehension  Verbalized understanding       PT Short Term Goals - 11/05/19 1354      PT SHORT TERM GOAL #1   Title  Patient will report at least 25% improvment in overall  functional mobility.    Time  3    Period  Weeks    Status  New    Target Date  11/26/19      PT SHORT TERM GOAL #2   Title  Patient will be independent in HEP to improve functional outcomes.    Time  2    Period  Weeks    Status  New    Target Date  11/26/19        PT Long Term Goals - 11/05/19 1355      PT LONG TERM GOAL #1   Title  Patient will report at least 50% improvment in overall functional mobility.    Time  6    Period  Weeks    Status  New    Target Date  12/17/19      PT LONG TERM GOAL #2   Title  Patient will score with < 40% limitation on FOTO to demonstrate improved functional tolerance.    Time  6    Period  Weeks    Status  New    Target Date  12/17/19      PT LONG TERM GOAL #3   Title  Patietn will be abel to ambualte at least 10 minutes without pain    Time  6    Period  Weeks    Status  New    Target Date  12/17/19             Plan - 11/05/19 1351    Clinical Impression Statement  Patient presents with chronic right ankle and knee pain. Patient interested in focusing on developing on home program to work on things to improve balance, strength and ROM to help decrease stress on right leg and improve function while decreasing pain. Patient presents functional limitation that would benefit from skilled physical therapy.    Personal Factors and Comorbidities  Comorbidity 1    Comorbidities  obesity, depression, chronic knee and ankle pain    Examination-Activity Limitations  Stand;Stairs;Squat;Sit;Locomotion Level;Carry;Transfers    Examination-Participation Restrictions  Cleaning;Shop;Community Activity;Laundry;Meal Prep    Stability/Clinical Decision Making  Stable/Uncomplicated    Clinical Decision Making  Low    Rehab Potential  Fair    PT Frequency  1x / week    PT Duration  6 weeks    PT Treatment/Interventions  ADLs/Self Care Home Management;Aquatic Therapy;Cryotherapy;Electrical Stimulation;Moist Heat;Traction;Balance  training;Therapeutic exercise;Therapeutic activities;Functional mobility training;Stair training;Gait training;Orthotic Fit/Training;Ultrasound;Neuromuscular re-education;Patient/family education;Compression bandaging;Passive range of motion;Dry needling    PT Next Visit Plan  focus on HEP development. ankle/knee stretches, ankle ROM and strengthening, balance as tolerated.    PT Home Exercise Plan  1/22 self mobilization to foot and lower leg with lacrosse ball and rolling stick       Patient will benefit from skilled therapeutic intervention in order to improve the following deficits and impairments:  Abnormal gait, Pain, Decreased strength, Difficulty walking, Decreased range of  motion, Decreased balance, Decreased mobility, Decreased activity tolerance, Increased edema  Visit Diagnosis: Chronic pain of right knee  Pain in right ankle and joints of right foot  Difficulty in walking, not elsewhere classified     Problem List Patient Active Problem List   Diagnosis Date Noted  . Ankle pain, right 07/26/2019  . Encounter for completion of form with patient 12/09/2018  . Recurrent major depressive disorder, in partial remission (HCC) 11/06/2018  . Left anterior knee pain 11/18/2016  . Vitamin D deficiency 08/05/2016  . Annual physical exam 10/30/2015  . Dyspepsia 10/30/2015  . Metabolic syndrome X 03/21/2014  . Dyslipidemia 03/21/2014  . GAD (generalized anxiety disorder) 03/03/2014  . Panic attack 03/03/2014  . Depression, recurrent (HCC) 11/04/2013  . Knee pain, right 07/23/2013  . Prediabetes 05/20/2012  . MORBID OBESITY 12/27/2009  . ALLERGIC RHINITIS CAUSE UNSPECIFIED 12/27/2009   2:12 PM, 11/05/19 Tereasa Coop, DPT Physical Therapy with [facility]  [phone] office  [facility] [address] [place], [place], [address] Phone: [phone]   Fax:  [phone]  Name: [name] MRN:  [id number] Date of Birth: 07-04-1981

## 2019-11-09 ENCOUNTER — Other Ambulatory Visit (HOSPITAL_COMMUNITY): Payer: Self-pay | Admitting: Psychiatry

## 2019-11-09 ENCOUNTER — Telehealth (HOSPITAL_COMMUNITY): Payer: Self-pay | Admitting: Psychiatry

## 2019-11-09 MED ORDER — SERTRALINE HCL 50 MG PO TABS: 50.0000 mg | ORAL_TABLET | Freq: Every day | ORAL | 1 refills | Status: DC

## 2019-11-09 NOTE — Telephone Encounter (Signed)
Received request for prescription refill, prescription sent.

## 2019-11-10 ENCOUNTER — Ambulatory Visit (HOSPITAL_COMMUNITY): Payer: BC Managed Care – PPO

## 2019-11-10 ENCOUNTER — Other Ambulatory Visit: Payer: Self-pay

## 2019-11-10 ENCOUNTER — Encounter (HOSPITAL_COMMUNITY): Payer: Self-pay

## 2019-11-10 DIAGNOSIS — R262 Difficulty in walking, not elsewhere classified: Secondary | ICD-10-CM

## 2019-11-10 DIAGNOSIS — M25561 Pain in right knee: Secondary | ICD-10-CM

## 2019-11-10 DIAGNOSIS — G8929 Other chronic pain: Secondary | ICD-10-CM

## 2019-11-10 DIAGNOSIS — M25571 Pain in right ankle and joints of right foot: Secondary | ICD-10-CM

## 2019-11-10 NOTE — Patient Instructions (Signed)
Toe / Heel Raise (Standing)    Standing with support, raise heels, then rock back on heels and raise toes. Repeat 10 times, 2 sets a day.   Copyright  VHI. All rights reserved.    3 way calf stretch                       Knee flexion stretch:   With Rt foot on 2nd step at home, foot flat and bend knee over step until you feel stretch.   Hold this stretch for 10" and complete  Times.  Heel slide:  Sitting on edge of chair bend Rt knee until you feel stretch on knee.  May use Lt leg to assist with more stretch.

## 2019-11-10 NOTE — Therapy (Signed)
[facility] [address] [place], [place], [address] Phone: [phone]   Fax:  [phone]  Physical Therapy Treatment  Patient Details  Name: [name] MRN: [id number] Date of Birth: 1981-10-07 Referring Provider (PT):  Fuller Canada    Encounter Date: 11/10/2019  PT End of Session - 11/10/19 1629    Visit Number  2    Number of Visits  6    Date for PT Re-Evaluation  12/17/19    Authorization Type  [employer], VL 30, No auth    Authorization Time Period  POC dates 11/05/19 to 12/17/2019    Authorization - Visit Number  2    Authorization - Number of Visits  30    PT Start Time  1628   pt late for apt   PT Stop Time  1657    PT Time Calculation (min)  29 min    Activity Tolerance  Patient tolerated treatment well   Reports ankle pain 1/10 following dynamic surface balance activity.   Behavior During Therapy  [facility] for tasks assessed/performed       Past Medical History:  Diagnosis Date  . Morbid obesity (HCC)     Past Surgical History:  Procedure Laterality Date  . CESAREAN SECTION     x 3  . TUBAL LIGATION      There were no vitals filed for this visit.  Subjective Assessment - 11/10/19 1629    Subjective  Pt reports no pain currently.  Main issue with a pull on front of knee and does have edema present medial aspect of ankle.  Reports she has began the self care with ball on ankle    Patient Stated Goals  to have less pain    Currently in Pain?  No/denies                       [facility] Adult PT Treatment/Exercise - 11/10/19 0001      Exercises   Exercises  Knee/Hip      Knee/Hip Exercises: Stretches   Knee: Self-Stretch to increase Flexion  5 reps;10 seconds    Knee: Self-Stretch Limitations  knee drive    Gastroc Stretch  3 reps;30 seconds    Gastroc Stretch Limitations  3 way calf stretch      Knee/Hip Exercises: Standing   Heel Raises  10 reps    Heel Raises Limitations  toe  raises    SLS  Rt 20" Lt 35"    Other Standing Knee Exercises  tandem stance 1x 30" solid; 2x 30" dynamic surface      Knee/Hip Exercises: Seated   Heel Slides  5 reps    Heel Slides Limitations  heel slide with AAROM for stretch    Other Seated Knee/Hip Exercises  heel and toe raises 10             PT Education - 11/10/19 1659    Education Details  Reviewed goals, educated importance of compliance with HEP and established HEP for ankle and knee mobility.  Reviewed RICE techniques for pain and edema control.    Person(s) Educated  Patient    Methods  Explanation;Demonstration;Verbal cues;Handout    Comprehension  Verbalized understanding;Returned demonstration       PT Short Term Goals - 11/05/19 1354      PT SHORT TERM GOAL #1   Title  Patient will report at least 25% improvment in overall functional mobility.  Time  3    Period  Weeks    Status  New    Target Date  11/26/19      PT SHORT TERM GOAL #2   Title  Patient will be independent in HEP to improve functional outcomes.    Time  2    Period  Weeks    Status  New    Target Date  11/26/19        PT Long Term Goals - 11/05/19 1355      PT LONG TERM GOAL #1   Title  Patient will report at least 50% improvment in overall functional mobility.    Time  6    Period  Weeks    Status  New    Target Date  12/17/19      PT LONG TERM GOAL #2   Title  Patient will score with < 40% limitation on FOTO to demonstrate improved functional tolerance.    Time  6    Period  Weeks    Status  New    Target Date  12/17/19      PT LONG TERM GOAL #3   Title  Patietn will be abel to ambualte at least 10 minutes without pain    Time  6    Period  Weeks    Status  New    Target Date  12/17/19            Plan - 11/10/19 1712    Clinical Impression Statement  Reviewed goals and established HEP for ankle strengthening and stretches for knee and ankle.  Pt able to complete all exericses wiht good form and given  printout for carryover at home.  Balance activities were somewhat difficult, reports increased ankle pain following tandem on dynamic surface.  Pt educated on RICE techniques to assist with pain and edema control.  No reports of pain at EOS.    Personal Factors and Comorbidities  Comorbidity 1    Comorbidities  obesity, depression, chronic knee and ankle pain    Examination-Activity Limitations  Stand;Stairs;Squat;Sit;Locomotion Level;Carry;Transfers    Examination-Participation Restrictions  Cleaning;Shop;Community Activity;Laundry;Meal Prep    Stability/Clinical Decision Making  Stable/Uncomplicated    Clinical Decision Making  Low    Rehab Potential  Fair    PT Frequency  1x / week    PT Duration  6 weeks    PT Treatment/Interventions  ADLs/Self Care Home Management;Aquatic Therapy;Cryotherapy;Electrical Stimulation;Moist Heat;Traction;Balance training;Therapeutic exercise;Therapeutic activities;Functional mobility training;Stair training;Gait training;Orthotic Fit/Training;Ultrasound;Neuromuscular re-education;Patient/family education;Compression bandaging;Passive range of motion;Dry needling    PT Next Visit Plan  focus on HEP development. ankle/knee stretches, ankle ROM and strengthening, balance as tolerated.    PT Home Exercise Plan  1/22 self mobilization to foot and lower leg with lacrosse ball and rolling stick; 1/27: heel/toe raises, 3 way calf stretch, heel slide with AAROM for knee fleixon and knee drive on step       Patient will benefit from skilled therapeutic intervention in order to improve the following deficits and impairments:  Abnormal gait, Pain, Decreased strength, Difficulty walking, Decreased range of motion, Decreased balance, Decreased mobility, Decreased activity tolerance, Increased edema  Visit Diagnosis: Chronic pain of right knee  Pain in right ankle and joints of right foot  Difficulty in walking, not elsewhere classified     Problem List Patient Active  Problem List   Diagnosis Date Noted  . Ankle pain, right 07/26/2019  . Encounter for completion of form with patient 12/09/2018  . Recurrent  major depressive disorder, in partial remission ([place]) 11/06/2018  . Left anterior knee pain 11/18/2016  . Vitamin D deficiency 08/05/2016  . Annual physical exam 10/30/2015  . Dyspepsia 10/30/2015  . Metabolic syndrome X 37/07/6268  . Dyslipidemia 03/21/2014  . GAD (generalized anxiety disorder) 03/03/2014  . Panic attack 03/03/2014  . Depression, recurrent ([place]) 11/04/2013  . Knee pain, right 07/23/2013  . Prediabetes 05/20/2012  . MORBID OBESITY 12/27/2009  . ALLERGIC RHINITIS CAUSE UNSPECIFIED 12/27/2009   Ihor Austin, LPTA; [place]  Aldona Lento 11/10/2019, 5:28 PM  [place] [address] [place], [place], [address] Phone: [phone]   Fax:  [phone]  Name: [name] MRN: [id number] Date of Birth: 07-25-1981

## 2019-11-12 ENCOUNTER — Telehealth: Payer: Self-pay | Admitting: Orthopedic Surgery

## 2019-11-12 NOTE — Telephone Encounter (Signed)
Called patient upon receipt of [employer] forms received via fax. Discussed forms process; patient understands and will come to office to complete [employer] forms/fee.

## 2019-11-16 ENCOUNTER — Other Ambulatory Visit: Payer: Self-pay

## 2019-11-16 ENCOUNTER — Ambulatory Visit (HOSPITAL_COMMUNITY): Payer: BC Managed Care – PPO | Attending: Orthopedic Surgery

## 2019-11-16 ENCOUNTER — Encounter (HOSPITAL_COMMUNITY): Payer: Self-pay

## 2019-11-16 DIAGNOSIS — M25561 Pain in right knee: Secondary | ICD-10-CM | POA: Insufficient documentation

## 2019-11-16 DIAGNOSIS — M25571 Pain in right ankle and joints of right foot: Secondary | ICD-10-CM | POA: Insufficient documentation

## 2019-11-16 DIAGNOSIS — R262 Difficulty in walking, not elsewhere classified: Secondary | ICD-10-CM | POA: Diagnosis present

## 2019-11-16 DIAGNOSIS — G8929 Other chronic pain: Secondary | ICD-10-CM

## 2019-11-16 NOTE — Patient Instructions (Signed)
Plantarflexion (Eccentric), (Resistance Band)    Point foot down against resistance band. Slowly release for 3-5 seconds. Use red resistance band. 10 reps per set, 2 sets per day. http://ecce.exer.us/3   Copyright  VHI. All rights reserved.   Dorsiflexion (Eccentric), (Resistance Band)    Pull foot up against resistance band. Slowly release for 3-5 seconds. Use red resistance band. 10 reps per set, 2 sets per day.  http://ecce.exer.us/1   Copyright  VHI. All rights reserved.   Eversion (Eccentric), (Resistance Band)    Push foot outward against resistance band. Slowly release for 3-5 seconds. Use red resistance band. 10 reps per set, 2 sets per day.  http://ecce.exer.us/7   Copyright  VHI. All rights reserved.    Inversion: Resisted    Cross legs with right leg underneath, foot in tubing loop. Hold tubing around other foot to resist and turn foot in. Repeat 10 times per set. Do 2 sets per session.  http://orth.exer.us/13   Copyright  VHI. All rights reserved.

## 2019-11-16 NOTE — Therapy (Signed)
[facility] [address] [place], [place], [address] Phone: [phone]   Fax:  [phone]  Physical Therapy Treatment  Patient Details  Name: [name] MRN: [id number] Date of Birth: [birth year] Referring Provider (PT):  Fuller Canada    Encounter Date: 11/16/2019  PT End of Session - 11/16/19 1820    Visit Number  3    Number of Visits  6    Date for PT Re-Evaluation  12/17/19    Authorization Type  [employer] Comm[employer], VL 30, No auth    Authorization Time Period  POC dates 11/05/19 to 12/17/2019    Authorization - Visit Number  3    Authorization - Number of Visits  30    PT Start Time  1750    PT Stop Time  1828    PT Time Calculation (min)  38 min    Activity Tolerance  Patient tolerated treatment well;Patient limited by pain;No increased pain    Behavior During Therapy  WFL for tasks assessed/performed       Past Medical History:  Diagnosis Date  . Morbid obesity (HCC)     Past Surgical History:  Procedure Laterality Date  . CESAREAN SECTION     x 3  . TUBAL LIGATION      There were no vitals filed for this visit.  Subjective Assessment - 11/16/19 1757    Subjective  Pt arrived with ankle brace in hands, stated it wouldn't fit in tennis shoes at orthothics.    Pertinent History  chronic knee and ankle pain, obesity, depression    Currently in Pain?  Yes    Pain Score  5     Pain Location  Leg   Rt knee and ankle   Pain Orientation  Right    Pain Descriptors / Indicators  Aching    Pain Type  Chronic pain    Pain Onset  More than a month ago    Pain Frequency  Intermittent    Aggravating Factors   walking, standing    Pain Relieving Factors  medication                       [facility] Adult PT Treatment/Exercise - 11/16/19 0001      Exercises   Exercises  Knee/Hip      Knee/Hip Exercises: Stretches   Knee: Self-Stretch to increase Flexion  5 reps;10 seconds    Knee: Self-Stretch Limitations   knee drive on 74BS step    Gastroc Stretch  Limitations    Gastroc Stretch Limitations  add slant board next session      Knee/Hip Exercises: Standing   Heel Raises  10 reps    Heel Raises Limitations  toe raises      Knee/Hip Exercises: Seated   Other Seated Knee/Hip Exercises  RTB all directions for Rt ankle             PT Education - 11/16/19 1810    Education Details  Pt educated on compression garments for edema control to assist with pain.  Measurements taken and printout given.    Person(s) Educated  Patient    Methods  Explanation;Handout    Comprehension  Verbalized understanding       PT Short Term Goals - 11/05/19 1354      PT SHORT TERM GOAL #1   Title  Patient will report at least 25% improvment in overall functional mobility.    Time  3    Period  Weeks    Status  New    Target Date  11/26/19      PT SHORT TERM GOAL #2   Title  Patient will be independent in HEP to improve functional outcomes.    Time  2    Period  Weeks    Status  New    Target Date  11/26/19        PT Long Term Goals - 11/05/19 1355      PT LONG TERM GOAL #1   Title  Patient will report at least 50% improvment in overall functional mobility.    Time  6    Period  Weeks    Status  New    Target Date  12/17/19      PT LONG TERM GOAL #2   Title  Patient will score with < 40% limitation on FOTO to demonstrate improved functional tolerance.    Time  6    Period  Weeks    Status  New    Target Date  12/17/19      PT LONG TERM GOAL #3   Title  Patietn will be abel to ambualte at least 10 minutes without pain    Time  6    Period  Weeks    Status  New    Target Date  12/17/19            Plan - 11/16/19 1830    Clinical Impression Statement  Pt arrived with ankle brace in box, reports she just got from orthotics earlier today.  Pt unable to fit brace into shoes.  Pt limited by pain and noted edema present Bil ankles.  Pt educated on benefits of compression garmets  to assist with edema for pain control, measurements taken for Bil LE knee high compression garments and paperwork given.  Discussed HEP compliance.  Added theraband strengthening exercises to HEP, pt able to demonstrate appropriate mechanics and given printout for carryover.  Following heel/toe raises reports slight increased ache.  Encouarged pt to reach out to MD for required brace wearing.    Personal Factors and Comorbidities  Comorbidity 1    Comorbidities  obesity, depression, chronic knee and ankle pain    Examination-Activity Limitations  Stand;Stairs;Squat;Sit;Locomotion Level;Carry;Transfers    Examination-Participation Restrictions  Cleaning;Shop;Community Activity;Laundry;Meal Prep    Stability/Clinical Decision Making  Stable/Uncomplicated    Clinical Decision Making  Low    Rehab Potential  Fair    PT Frequency  1x / week    PT Duration  6 weeks    PT Treatment/Interventions  ADLs/Self Care Home Management;Aquatic Therapy;Cryotherapy;Electrical Stimulation;Moist Heat;Traction;Balance training;Therapeutic exercise;Therapeutic activities;Functional mobility training;Stair training;Gait training;Orthotic Fit/Training;Ultrasound;Neuromuscular re-education;Patient/family education;Compression bandaging;Passive range of motion;Dry needling    PT Next Visit Plan  focus on HEP development. ankle/knee stretches, ankle ROM and strengthening, balance as tolerated.    PT Home Exercise Plan  1/22 self mobilization to foot and lower leg with lacrosse ball and rolling stick; 1/27: heel/toe raises, 3 way calf stretch, heel slide with AAROM for knee fleixon and knee drive on step; 11/22/77: RTB all directions       Patient will benefit from skilled therapeutic intervention in order to improve the following deficits and impairments:  Abnormal gait, Pain, Decreased strength, Difficulty walking, Decreased range of motion, Decreased balance, Decreased mobility, Decreased activity tolerance, Increased  edema  Visit Diagnosis: Chronic pain of right knee  Pain in right ankle and joints of right foot  Difficulty  in walking, not elsewhere classified     Problem List Patient Active Problem List   Diagnosis Date Noted  . Ankle pain, right 07/26/2019  . Encounter for completion of form with patient 12/09/2018  . Recurrent major depressive disorder, in partial remission ([place]) 11/06/2018  . Left anterior knee pain 11/18/2016  . Vitamin D deficiency 08/05/2016  . Annual physical exam 10/30/2015  . Dyspepsia 10/30/2015  . Metabolic syndrome X 82/50/5397  . Dyslipidemia 03/21/2014  . GAD (generalized anxiety disorder) 03/03/2014  . Panic attack 03/03/2014  . Depression, recurrent ([place]) 11/04/2013  . Knee pain, right 07/23/2013  . Prediabetes 05/20/2012  . MORBID OBESITY 12/27/2009  . ALLERGIC RHINITIS CAUSE UNSPECIFIED 12/27/2009   Ihor Austin, LPTA; CBIS [phone]  Aldona Lento 11/16/2019, 6:38 PM  [facility] [address] [place], [place], [address] Phone: [phone]   Fax:  [phone]  Name: ZORAIDA HAVRILLA MRN: [id number] Date of Birth: [birth year]

## 2019-11-17 ENCOUNTER — Encounter: Payer: Self-pay | Admitting: Orthopedic Surgery

## 2019-11-18 ENCOUNTER — Encounter (HOSPITAL_COMMUNITY): Payer: BC Managed Care – PPO | Admitting: Physical Therapy

## 2019-11-22 ENCOUNTER — Encounter: Payer: Self-pay | Admitting: Psychiatry

## 2019-11-22 ENCOUNTER — Other Ambulatory Visit: Payer: Self-pay

## 2019-11-22 ENCOUNTER — Ambulatory Visit (INDEPENDENT_AMBULATORY_CARE_PROVIDER_SITE_OTHER): Payer: BC Managed Care – PPO | Admitting: Psychiatry

## 2019-11-22 DIAGNOSIS — F3342 Major depressive disorder, recurrent, in full remission: Secondary | ICD-10-CM | POA: Insufficient documentation

## 2019-11-22 MED ORDER — SERTRALINE HCL 50 MG PO TABS: 50.0000 mg | ORAL_TABLET | Freq: Every day | ORAL | 0 refills | Status: DC

## 2019-11-22 MED ORDER — BUSPIRONE HCL 7.5 MG PO TABS: 7.5000 mg | ORAL_TABLET | Freq: Three times a day (TID) | ORAL | 0 refills | Status: DC

## 2019-11-22 NOTE — Progress Notes (Signed)
[facility] MD OP Progress Note  I connected with  [name] on 11/22/19 by a video enabled telemedicine application and verified that I am speaking with the correct person using two identifiers.   I discussed the limitations of evaluation and management by telemedicine. The patient expressed understanding and agreed to proceed.    11/22/2019 1:05 PM [name]  MRN:  [id number]  Chief Complaint: " I am doing fine."  HPI: Patient reported that she is doing well on her current regimen.  She stated that her mood and anxiety have been well controlled with the help of Zoloft and BuSpar.  She feels she is on the right track for now.  She is doing well at work as well as at home.  She denied any difficulties with sleep.  She denied any issues or concerns at this time.  Visit Diagnosis:    ICD-10-CM   1. MDD (major depressive disorder), recurrent, in full remission (HCC)  F33.42     Past Psychiatric History: Depression, anxiety  Past Medical History:  Past Medical History:  Diagnosis Date  . Morbid obesity (HCC)     Past Surgical History:  Procedure Laterality Date  . CESAREAN SECTION     x 3  . TUBAL LIGATION      Family Psychiatric History: see below  Family History:  Family History  Problem Relation Age of Onset  . Heart disease Mother   . Asthma Mother   . Hypertension Mother   . Sleep apnea Mother   . Anxiety disorder Mother   . Diabetes Brother   . Sleep apnea Father   . Anxiety disorder Father     Social History:  Social History   Socioeconomic History  . Marital status: Married    Spouse name: Not on file  . Number of children: Not on file  . Years of education: Not on file  . Highest education level: Not on file  Occupational History  . Not on file  Tobacco Use  . Smoking status: Never Smoker  . Smokeless tobacco: Never Used  Substance and Sexual Activity  . Alcohol use: No  . Drug use: No  . Sexual activity: Not on file  Other Topics  Concern  . Not on file  Social History Narrative  . Not on file   Social Determinants of Health   Financial Resource Strain:   . Difficulty of Paying Living Expenses: Not on file  Food Insecurity:   . Worried About Programme researcher, broadcasting/film/video in the Last Year: Not on file  . Ran Out of Food in the Last Year: Not on file  Transportation Needs:   . Lack of Transportation (Medical): Not on file  . Lack of Transportation (Non-Medical): Not on file  Physical Activity:   . Days of Exercise per Week: Not on file  . Minutes of Exercise per Session: Not on file  Stress:   . Feeling of Stress : Not on file  Social Connections:   . Frequency of Communication with Friends and Family: Not on file  . Frequency of Social Gatherings with Friends and Family: Not on file  . Attends Religious Services: Not on file  . Active Member of Clubs or Organizations: Not on file  . Attends Banker Meetings: Not on file  . Marital Status: Not on file    Allergies:  Allergies  Allergen Reactions  . Hydrocodone Itching  . Penicillins Hives    Metabolic Disorder Labs: Lab Results  Component Value Date   HGBA1C 6.1 (H) 07/26/2019   MPG 128 07/26/2019   MPG 120 06/08/2018   No results found for: PROLACTIN Lab Results  Component Value Date   CHOL 154 07/26/2019   TRIG 75 07/26/2019   HDL 45 (L) 07/26/2019   CHOLHDL 3.4 07/26/2019   VLDL 14 07/13/2016   LDLCALC 93 07/26/2019   LDLCALC 90 12/20/2017   Lab Results  Component Value Date   TSH 0.83 07/26/2019   TSH 1.18 06/08/2018    Therapeutic Level Labs: No results found for: LITHIUM No results found for: VALPROATE No components found for:  CBMZ  Current Medications: Current Outpatient Medications  Medication Sig Dispense Refill  . busPIRone (BUSPAR) 7.5 MG tablet Take 1 tablet (7.5 mg total) by mouth 3 (three) times daily. 270 tablet 0  . ibuprofen (ADVIL) 800 MG tablet Take 1 tablet (800 mg total) by mouth 2 (two) times daily.  30 tablet 1  . meloxicam (MOBIC) 7.5 MG tablet Take 1 tablet (7.5 mg total) by mouth daily. 30 tablet 5  . phentermine (ADIPEX-P) 37.5 MG tablet Take half tablet once daily every morning with breakfast 15 tablet 3  . sertraline (ZOLOFT) 50 MG tablet Take 1 tablet (50 mg total) by mouth daily. 30 tablet 1  . Vitamin D, Ergocalciferol, (DRISDOL) 1.25 MG (50000 UNIT) CAPS capsule Take 1 capsule by mouth once a week 4 capsule 0   No current facility-administered medications for this visit.       Psychiatric Specialty Exam: Review of Systems  There were no vitals taken for this visit.There is no height or weight on file to calculate BMI.  General Appearance: Well Groomed  Eye Contact:  Good  Speech:  Clear and Coherent and Normal Rate  Volume:  Normal  Mood:  Euthymic  Affect:  Congruent  Thought Process:  Goal Directed, Linear and Descriptions of Associations: Intact  Orientation:  Full (Time, Place, and Person)  Thought Content: Logical   Suicidal Thoughts:  No  Homicidal Thoughts:  No  Memory:  Recent;   Good Remote;   Good  Judgement:  Good  Insight:  Good  Psychomotor Activity:  Normal  Concentration:  Concentration: Good and Attention Span: Good  Recall:  Good  Fund of Knowledge: Good  Language: Good  Akathisia:  Negative  Handed:  Right  AIMS (if indicated): not done  Assets:  Communication Skills Desire for Improvement Financial Resources/Insurance Housing  ADL's:  Intact  Cognition: WNL  Sleep:  Good   Screenings: GAD-7     Office Visit from 06/08/2018 in [facility] Office Visit from 12/15/2017 in [facility] Office Visit from 08/05/2016 in [facility]  Total GAD-7 Score  18  12  3     PHQ2-9     Office Visit from 07/26/2019 in [facility] Office Visit from 04/28/2019 in [facility] Office Visit from 12/08/2018 in [facility] Office Visit from 06/08/2018 in [facility] Office Visit  from 12/15/2017 in [facility]  PHQ-2 Total Score  0  0  0  2  1  PHQ-9 Total Score  --  --  3  12  10        Assessment and Plan: 39 year old female with history of depression and anxiety now stable on current regimen.  1. MDD (major depressive disorder), recurrent, in full remission ([place])  - busPIRone (BUSPAR) 7.5 MG tablet; Take 1 tablet (7.5 mg total) by mouth 3 (three)  times daily.  Dispense: 270 tablet; Refill: 0 - sertraline (ZOLOFT) 50 MG tablet; Take 1 tablet (50 mg total) by mouth daily.  Dispense: 90 tablet; Refill: 0  Continue same medication regimen. Follow up in 3 months.   Zena Amos, MD 11/22/2019, 1:05 PM

## 2019-11-23 ENCOUNTER — Ambulatory Visit (HOSPITAL_COMMUNITY): Payer: BC Managed Care – PPO | Admitting: Physical Therapy

## 2019-11-23 DIAGNOSIS — M25571 Pain in right ankle and joints of right foot: Secondary | ICD-10-CM

## 2019-11-23 DIAGNOSIS — G8929 Other chronic pain: Secondary | ICD-10-CM

## 2019-11-23 DIAGNOSIS — R262 Difficulty in walking, not elsewhere classified: Secondary | ICD-10-CM

## 2019-11-23 DIAGNOSIS — M25561 Pain in right knee: Secondary | ICD-10-CM | POA: Diagnosis not present

## 2019-11-23 NOTE — Therapy (Signed)
[facility] [address] [place], [place], [address] Phone: [phone]   Fax:  [phone]  Physical Therapy Treatment  Patient Details  Name: [name] MRN: [id number] Date of Birth: [birth year] Referring Provider (PT):  Fuller Canada    Encounter Date: 11/23/2019  PT End of Session - 11/23/19 1657    Visit Number  4    Number of Visits  6    Date for PT Re-Evaluation  12/17/19    Authorization Type  [employer] Comm[employer], VL 30, No auth    Authorization Time Period  POC dates 11/05/19 to 12/17/2019    Authorization - Visit Number  4    Authorization - Number of Visits  30    PT Start Time  1630    PT Stop Time  1700    PT Time Calculation (min)  30 min    Activity Tolerance  Patient tolerated treatment well;Patient limited by pain;No increased pain    Behavior During Therapy  WFL for tasks assessed/performed       Past Medical History:  Diagnosis Date  . Morbid obesity (HCC)     Past Surgical History:  Procedure Laterality Date  . CESAREAN SECTION     x 3  . TUBAL LIGATION      There were no vitals filed for this visit.  Subjective Assessment - 11/23/19 1633    Subjective  pt was late for appointment.  pt states her ankle brace fits, however she can only wear it with particular shoes.  CUrrently is not wearing her brace and approximates only wearing it 2X week.    Currently in Pain?  No/denies                       [facility] Adult PT Treatment/Exercise - 11/23/19 0001      Knee/Hip Exercises: Stretches   Knee: Self-Stretch to increase Flexion  5 reps;10 seconds    Knee: Self-Stretch Limitations  knee drive on 98PJ step    Gastroc Stretch  Limitations;3 reps;30 seconds    Gastroc Stretch Limitations  slant board      Knee/Hip Exercises: Standing   Heel Raises  15 reps    Heel Raises Limitations  toe raises 15 reps    Lateral Step Up  Both;10 reps;Hand Hold: 1;Step Height: 4"    Forward Step Up   Both;10 reps;Hand Hold: 1;Step Height: 4"    SLS  Rt 8" Lt 12"    SLS with Vectors  5X3" each with 1 HHA    Other Standing Knee Exercises  tandem stance 2x 30"                PT Short Term Goals - 11/05/19 1354      PT SHORT TERM GOAL #1   Title  Patient will report at least 25% improvment in overall functional mobility.    Time  3    Period  Weeks    Status  New    Target Date  11/26/19      PT SHORT TERM GOAL #2   Title  Patient will be independent in HEP to improve functional outcomes.    Time  2    Period  Weeks    Status  New    Target Date  11/26/19        PT Long Term Goals - 11/05/19 1355      PT LONG TERM GOAL #1   Title  Patient will report at least 50% improvment in overall functional mobility.    Time  6    Period  Weeks    Status  New    Target Date  12/17/19      PT LONG TERM GOAL #2   Title  Patient will score with < 40% limitation on FOTO to demonstrate improved functional tolerance.    Time  6    Period  Weeks    Status  New    Target Date  12/17/19      PT LONG TERM GOAL #3   Title  Patietn will be abel to ambualte at least 10 minutes without pain    Time  6    Period  Weeks    Status  New    Target Date  12/17/19            Plan - 11/23/19 1706    Clinical Impression Statement  Pt showed late for appt.  Able to complete most exercises with addition of step ups and balance actvities.  Continues to have diffiuclty completing SLS greater than 10 seconds so added vector stance to increase LE strength/stability.  Pt iwhtout any complaints during or at end of session today.    Personal Factors and Comorbidities  Comorbidity 1    Comorbidities  obesity, depression, chronic knee and ankle pain    Examination-Activity Limitations  Stand;Stairs;Squat;Sit;Locomotion Level;Carry;Transfers    Examination-Participation Restrictions  Cleaning;Shop;Community Activity;Laundry;Meal Prep    Stability/Clinical Decision Making  Stable/Uncomplicated     Rehab Potential  Fair    PT Frequency  1x / week    PT Duration  6 weeks    PT Treatment/Interventions  ADLs/Self Care Home Management;Aquatic Therapy;Cryotherapy;Electrical Stimulation;Moist Heat;Traction;Balance training;Therapeutic exercise;Therapeutic activities;Functional mobility training;Stair training;Gait training;Orthotic Fit/Training;Ultrasound;Neuromuscular re-education;Patient/family education;Compression bandaging;Passive range of motion;Dry needling    PT Next Visit Plan  focus on HEP development. ankle/knee stretches, ankle ROM and strengthening, balance as tolerated.    PT Home Exercise Plan  1/22 self mobilization to foot and lower leg with lacrosse ball and rolling stick; 1/27: heel/toe raises, 3 way calf stretch, heel slide with AAROM for knee fleixon and knee drive on step; 03/15/82: RTB all directions       Patient will benefit from skilled therapeutic intervention in order to improve the following deficits and impairments:  Abnormal gait, Pain, Decreased strength, Difficulty walking, Decreased range of motion, Decreased balance, Decreased mobility, Decreased activity tolerance, Increased edema  Visit Diagnosis: Pain in right ankle and joints of right foot  Difficulty in walking, not elsewhere classified  Chronic pain of right knee     Problem List Patient Active Problem List   Diagnosis Date Noted  . MDD (major depressive disorder), recurrent, in full remission ([place]) 11/22/2019  . Ankle pain, right 07/26/2019  . Encounter for completion of form with patient 12/09/2018  . Recurrent major depressive disorder, in partial remission ([place]) 11/06/2018  . Left anterior knee pain 11/18/2016  . Vitamin D deficiency 08/05/2016  . Annual physical exam 10/30/2015  . Dyspepsia 10/30/2015  . Metabolic syndrome X 15/17/6160  . Dyslipidemia 03/21/2014  . GAD (generalized anxiety disorder) 03/03/2014  . Panic attack 03/03/2014  . Depression, recurrent ([place]) 11/04/2013  .  Knee pain, right 07/23/2013  . Prediabetes 05/20/2012  . MORBID OBESITY 12/27/2009  . ALLERGIC RHINITIS CAUSE UNSPECIFIED 12/27/2009   Teena Irani, PTA/CLT [phone]  Teena Irani 11/23/2019, 5:08 PM  [facility] [address]  [place], [place], [address] Phone: [phone]   Fax:  [phone]  Name: TEDRA COPPERNOLL MRN: [id number] Date of Birth: 1980/12/07

## 2019-11-25 ENCOUNTER — Encounter (HOSPITAL_COMMUNITY): Payer: BC Managed Care – PPO | Admitting: Physical Therapy

## 2019-11-29 ENCOUNTER — Ambulatory Visit: Payer: BC Managed Care – PPO | Admitting: Family Medicine

## 2019-11-30 ENCOUNTER — Other Ambulatory Visit: Payer: Self-pay

## 2019-11-30 ENCOUNTER — Encounter (HOSPITAL_COMMUNITY): Payer: Self-pay | Admitting: Physical Therapy

## 2019-11-30 ENCOUNTER — Ambulatory Visit (HOSPITAL_COMMUNITY): Payer: BC Managed Care – PPO | Admitting: Physical Therapy

## 2019-11-30 DIAGNOSIS — G8929 Other chronic pain: Secondary | ICD-10-CM

## 2019-11-30 DIAGNOSIS — M25561 Pain in right knee: Secondary | ICD-10-CM | POA: Diagnosis not present

## 2019-11-30 DIAGNOSIS — R262 Difficulty in walking, not elsewhere classified: Secondary | ICD-10-CM

## 2019-11-30 DIAGNOSIS — M25571 Pain in right ankle and joints of right foot: Secondary | ICD-10-CM

## 2019-11-30 NOTE — Patient Instructions (Signed)
Access Code: [email or web address]  URL: [email or web address]  Date: 11/30/2019  Prepared by: Georges Lynch   Exercises Squat with Chair Touch - 10 reps - 2 sets - 1x daily - 3x weekly Hip Abduction with Resistance Loop - 10 reps - 2 sets - 1x daily - 3x weekly Hip Extension with Resistance Loop - 10 reps - 2 sets - 1x daily - 3x weekly

## 2019-11-30 NOTE — Therapy (Signed)
[place], [place], [address] Phone: [phone]   Fax:  [phone]  Physical Therapy Treatment  Patient Details  Name: [name] MRN: [id number] Date of Birth: [birth year] Referring Provider (PT):  Arther Abbott    Encounter Date: 11/30/2019  PT End of Session - 11/30/19 1707    Visit Number  5    Number of Visits  6    Date for PT Re-Evaluation  12/17/19    Authorization Type  [employer], VL 30, No auth    Authorization Time Period  POC dates 11/05/19 to 12/17/2019    Authorization - Visit Number  5    Authorization - Number of Visits  30    PT Start Time  4010    PT Stop Time  1730    PT Time Calculation (min)  35 min    Activity Tolerance  Patient tolerated treatment well;Patient limited by pain;No increased pain    Behavior During Therapy  WFL for tasks assessed/performed       Past Medical History:  Diagnosis Date  . Morbid obesity ([place])     Past Surgical History:  Procedure Laterality Date  . CESAREAN SECTION     x 3  . TUBAL LIGATION      There were no vitals filed for this visit.  Subjective Assessment - 11/30/19 1707    Subjective  Patient says her ankle is feeling better but is still having some swelling and pain in her knee.    Currently in Pain?  Yes    Pain Score  5     Pain Location  Knee    Pain Orientation  Right    Pain Descriptors / Indicators  Aching    Pain Type  Chronic pain    Pain Onset  More than a month ago    Pain Frequency  Intermittent                       [facility] Adult PT Treatment/Exercise - 11/30/19 0001      Knee/Hip Exercises: Stretches   Knee: Self-Stretch to increase Flexion  5 reps;10 seconds    Knee: Self-Stretch Limitations  knee drive on 27OZ step    Gastroc Stretch  Limitations;3 reps;30 seconds    Gastroc Stretch Limitations  slant board      Knee/Hip Exercises: Standing   Heel Raises  15 reps    Heel Raises  Limitations  toe raises 15 reps    Hip Abduction  Both;2 sets;10 reps    Abduction Limitations  RTB    Hip Extension  Both;2 sets;10 reps    Extension Limitations  RTB    SLS  2 x 15" each    Other Standing Knee Exercises  tandem stance 1x 30"each on floor, 1 x 30" each on foam       Knee/Hip Exercises: Seated   Sit to Sand  without UE support;2 sets;10 reps               PT Short Term Goals - 11/05/19 1354      PT SHORT TERM GOAL #1   Title  Patient will report at least 25% improvment in overall functional mobility.    Time  3    Period  Weeks    Status  New    Target Date  11/26/19      PT SHORT TERM GOAL #2   Title  Patient will  be independent in HEP to improve functional outcomes.    Time  2    Period  Weeks    Status  New    Target Date  11/26/19        PT Long Term Goals - 11/05/19 1355      PT LONG TERM GOAL #1   Title  Patient will report at least 50% improvment in overall functional mobility.    Time  6    Period  Weeks    Status  New    Target Date  12/17/19      PT LONG TERM GOAL #2   Title  Patient will score with < 40% limitation on FOTO to demonstrate improved functional tolerance.    Time  6    Period  Weeks    Status  New    Target Date  12/17/19      PT LONG TERM GOAL #3   Title  Patietn will be abel to ambualte at least 10 minutes without pain    Time  6    Period  Weeks    Status  New    Target Date  12/17/19            Plan - 11/30/19 1829    Clinical Impression Statement  Patient tolerated session well today. Added sit to stands and standing hip abduction/ extension with RTB. Patient educated on proper form and function. Patient educated on importance of outer gluteal in knee stability, and of possibly contributing to RT knee pain. Patient noted decreased knee pain post session. Patient educated on and issued updated HEP handout.    Personal Factors and Comorbidities  Comorbidity 1    Comorbidities  obesity, depression,  chronic knee and ankle pain    Examination-Activity Limitations  Stand;Stairs;Squat;Sit;Locomotion Level;Carry;Transfers    Examination-Participation Restrictions  Cleaning;Shop;Community Activity;Laundry;Meal Prep    Stability/Clinical Decision Making  Stable/Uncomplicated    Rehab Potential  Fair    PT Frequency  1x / week    PT Duration  6 weeks    PT Treatment/Interventions  ADLs/Self Care Home Management;Aquatic Therapy;Cryotherapy;Electrical Stimulation;Moist Heat;Traction;Balance training;Therapeutic exercise;Therapeutic activities;Functional mobility training;Stair training;Gait training;Orthotic Fit/Training;Ultrasound;Neuromuscular re-education;Patient/family education;Compression bandaging;Passive range of motion;Dry needling    PT Next Visit Plan  Reassess next visit. focus on HEP development. ankle/knee stretches, ankle ROM and strengthening, balance as tolerated.    PT Home Exercise Plan  1/22 self mobilization to foot and lower leg with lacrosse ball and rolling stick; 1/27: heel/toe raises, 3 way calf stretch, heel slide with AAROM for knee fleixon and knee drive on step; 12/18/60: RTB all directions       Patient will benefit from skilled therapeutic intervention in order to improve the following deficits and impairments:  Abnormal gait, Pain, Decreased strength, Difficulty walking, Decreased range of motion, Decreased balance, Decreased mobility, Decreased activity tolerance, Increased edema  Visit Diagnosis: Pain in right ankle and joints of right foot  Difficulty in walking, not elsewhere classified  Chronic pain of right knee     Problem List Patient Active Problem List   Diagnosis Date Noted  . MDD (major depressive disorder), recurrent, in full remission (HCC) 11/22/2019  . Ankle pain, right 07/26/2019  . Encounter for completion of form with patient 12/09/2018  . Recurrent major depressive disorder, in partial remission (HCC) 11/06/2018  . Left anterior knee pain  11/18/2016  . Vitamin D deficiency 08/05/2016  . Annual physical exam 10/30/2015  . Dyspepsia 10/30/2015  . Metabolic syndrome X 03/21/2014  .  Dyslipidemia 03/21/2014  . GAD (generalized anxiety disorder) 03/03/2014  . Panic attack 03/03/2014  . Depression, recurrent (HCC) 11/04/2013  . Knee pain, right 07/23/2013  . Prediabetes 05/20/2012  . MORBID OBESITY 12/27/2009  . ALLERGIC RHINITIS CAUSE UNSPECIFIED 12/27/2009   6:33 PM, 11/30/19 Georges Lynch PT DPT  Physical Therapist with [facility]  [facility]  [phone]   [facility] [address] [place], [place], [address] Phone: [phone]   Fax:  [phone]  Name: [name] MRN: [id number] Date of Birth: 1981/02/19

## 2019-12-01 ENCOUNTER — Ambulatory Visit: Payer: BC Managed Care – PPO | Admitting: Family Medicine

## 2019-12-02 ENCOUNTER — Encounter (HOSPITAL_COMMUNITY): Payer: BC Managed Care – PPO | Admitting: Physical Therapy

## 2019-12-07 ENCOUNTER — Encounter (HOSPITAL_COMMUNITY): Payer: Self-pay

## 2019-12-07 ENCOUNTER — Other Ambulatory Visit: Payer: Self-pay

## 2019-12-07 ENCOUNTER — Ambulatory Visit (HOSPITAL_COMMUNITY): Payer: BC Managed Care – PPO

## 2019-12-07 DIAGNOSIS — M25571 Pain in right ankle and joints of right foot: Secondary | ICD-10-CM

## 2019-12-07 DIAGNOSIS — G8929 Other chronic pain: Secondary | ICD-10-CM

## 2019-12-07 DIAGNOSIS — R262 Difficulty in walking, not elsewhere classified: Secondary | ICD-10-CM

## 2019-12-07 DIAGNOSIS — M25561 Pain in right knee: Secondary | ICD-10-CM | POA: Diagnosis not present

## 2019-12-07 NOTE — Patient Instructions (Signed)
Squat    Feet shoulder width apart, back straight, heels down, bend hips and knees to almost 90. Keep knees parallel. Lower slowly, explode on return. Repeat  10 times. Do 2 sessions per day.  Copyright  VHI. All rights reserved.   Toe / Heel Raise (Standing)    Standing with support, raise heels, then rock back on heels and raise toes. Repeat 20 times.  Copyright  VHI. All rights reserved.   Single Leg Balance: Eyes Open    Stand on right leg with eyes open. Hold 30 seconds. 3 reps per day.    Climbing Stairs    When climbing stairs, breathe in with first step, breathe out through pursed lips with two or three steps. If you are more short of breath than usual: Breathe in while standing still. Breathe out through pursed lips while stepping up. Stop, then repeat with each step. Take only one step at a time. Do not hold your breath when climbing steps.  Copyright  VHI. All rights reserved.    http://ggbe.exer.us/5   Copyright  VHI. All rights reserved.

## 2019-12-07 NOTE — Therapy (Signed)
[place], [place], [address] Phone: [phone]   Fax:  [phone]  Physical Therapy Treatment  Patient Details  Name: [name] MRN: [id number] Date of Birth: May 15, 1981 Referring Provider (PT):  Arther Abbott    Encounter Date: 12/07/2019  PT End of Session - 12/07/19 1707    Visit Number  6    Number of Visits  6    Date for PT Re-Evaluation  12/17/19    Authorization Type  [employer], VL 30, No auth    Authorization Time Period  POC dates 11/05/19 to 12/17/2019    Authorization - Visit Number  6    Authorization - Number of Visits  30    PT Start Time  1630   pt late for apt   PT Stop Time  1700    PT Time Calculation (min)  30 min    Activity Tolerance  Patient tolerated treatment well;Patient limited by pain;No increased pain    Behavior During Therapy  WFL for tasks assessed/performed       Past Medical History:  Diagnosis Date  . Morbid obesity ([place])     Past Surgical History:  Procedure Laterality Date  . CESAREAN SECTION     x 3  . TUBAL LIGATION      There were no vitals filed for this visit.  Subjective Assessment - 12/07/19 1631    Subjective  Pt stated her ankle is feeling good, no reports of ankle pain.  Reports increased pain in Rt knee, reports ability to do more at home.  Current pain scale 1-2/10 Rt knee achey constant.    Pertinent History  chronic knee and ankle pain, obesity, depression    How long can you stand comfortably?  Able to stand for 30-45 minutes comfortably (was "not long")    How long can you walk comfortably?  Able to walk 30-45 minutes comfortably (was 5 minutes)    Patient Stated Goals  to have less pain    Currently in Pain?  Yes    Pain Score  2     Pain Location  Knee    Pain Orientation  Right    Pain Descriptors / Indicators  Aching    Pain Type  Chronic pain    Pain Onset  More than a month ago    Pain Frequency  Intermittent    Aggravating Factors   walking, standing    Pain Relieving Factors  medication         [facility] PT Assessment - 12/07/19 0001      Assessment   Medical Diagnosis  posterior tibial tendon dysfunction    Referring Provider (PT)   Arther Abbott     Next MD Visit  unscheduled       Observation/Other Assessments   Focus on Therapeutic Outcomes (FOTO)   33% limited   47% limited     ROM / Strength   AROM / PROM / Strength  AROM;Strength      AROM   AROM Assessment Site  Ankle;Knee    Right/Left Knee  Right;Left    Right Knee Extension  0    Right Knee Flexion  110    Right Ankle Dorsiflexion  12   was 2   Right Ankle Plantar Flexion  58   was 55   Left Ankle Dorsiflexion  10   was 5     Strength   Right Hip Flexion  4+/5  was 4+/5   Right Hip Extension  4+/5    Right Hip ABduction  4+/5    Left Hip Flexion  4+/5   was 4+/5   Left Hip Extension  4/5    Left Hip ABduction  4+/5    Right Knee Flexion  4+/5   was 4/5   Right Knee Extension  4+/5   was 4+   Left Knee Flexion  4+/5   was 4/5   Left Knee Extension  4+/5   was 4+   Right Ankle Dorsiflexion  5/5   was 4/5   Right Ankle Plantar Flexion  2+/5   unable to comlete full ROM with single leg stance, improved    Right Ankle Inversion  5/5   was 4/5   Right Ankle Eversion  5/5   was 4+   Left Ankle Dorsiflexion  5/5    Left Ankle Plantar Flexion  2+/5   unable to comlete full ROM with single leg stance, improved    Left Ankle Inversion  5/5   was 4+   Left Ankle Eversion  5/5   was 4+                  [facility] Adult PT Treatment/Exercise - 12/07/19 0001      Knee/Hip Exercises: Standing   Heel Raises  10 reps    Heel Raises Limitations  trial SLS, decreased ROM    Functional Squat  10 reps    Stairs  7in step height, 1 HR reciprical pattern ascend.  Step to or recipropal descending x 2    SLS  3 times Lt 22", Rt 27"              PT Education - 12/07/19 1813    Education Details   Reviewed findings, importance of compliance iwth HEP following DC.  Additional exercises given.    Person(s) Educated  Patient    Methods  Explanation;Handout    Comprehension  Verbalized understanding;Returned demonstration       PT Short Term Goals - 12/07/19 1636      PT SHORT TERM GOAL #1   Title  Patient will report at least 25% improvment in overall functional mobility.    Baseline  12/07/19:  Reports 25% improvements    Status  Achieved      PT SHORT TERM GOAL #2   Title  Patient will be independent in HEP to improve functional outcomes.    Baseline  12/07/19:  Reports compliance with HEP 3x/week    Status  Achieved        PT Long Term Goals - 12/07/19 1637      PT LONG TERM GOAL #1   Title  Patient will report at least 50% improvment in overall functional mobility.    Baseline  12/07/19:  Reports 25% improvements    Status  On-going      PT LONG TERM GOAL #2   Title  Patient will score with < 40% limitation on FOTO to demonstrate improved functional tolerance.    Baseline  12/07/19:  33% impaired (was 47%)    Status  Achieved      PT LONG TERM GOAL #3   Title  Patietn will be abel to ambualte at least 10 minutes without pain    Baseline  12/07/19:  Reports ability to ambulate 30-45 minutes wihtout pain, does have some pain going up hills    Status  Achieved  Plan - 12/07/19 1805    Clinical Impression Statement  Goals reviewed this session with the following findings:  Pt met 2/2 STG and 2/3 LTGs.  Pt reports she feels 25% improvements and compliance 3x/week.  Pt reports improved tolerance for standing and walking for 30-45 minutes pain free, most difficulty walking up hills and ascending steps.  Pt able to demonstrate reciprocal pattern ascending and step to/reciprocal pattern descending with no reports of pain.  ROM/MMT and FOTO complete with improved strength, ankle ROM improvements and 33% impairments wiht FOTO score (was 47% at eval.)  Reviewed  current difficulties and discussed continuing PT vs. DC to HEP, pt stated she feels comfortable for DC to HEP.  Pt given additional exercises for functional strengthening.    Personal Factors and Comorbidities  Comorbidity 1    Comorbidities  obesity, depression, chronic knee and ankle pain    Examination-Activity Limitations  Stand;Stairs;Squat;Sit;Locomotion Level;Carry;Transfers    Examination-Participation Restrictions  Cleaning;Shop;Community Activity;Laundry;Meal Prep    Stability/Clinical Decision Making  Stable/Uncomplicated    Clinical Decision Making  Low    Rehab Potential  Fair    PT Frequency  1x / week    PT Duration  6 weeks    PT Treatment/Interventions  ADLs/Self Care Home Management;Aquatic Therapy;Cryotherapy;Electrical Stimulation;Moist Heat;Traction;Balance training;Therapeutic exercise;Therapeutic activities;Functional mobility training;Stair training;Gait training;Orthotic Fit/Training;Ultrasound;Neuromuscular re-education;Patient/family education;Compression bandaging;Passive range of motion;Dry needling    PT Next Visit Plan  DC to HEP.    PT Home Exercise Plan  1/22 self mobilization to foot and lower leg with lacrosse ball and rolling stick; 1/27: heel/toe raises, 3 way calf stretch, heel slide with AAROM for knee fleixon and knee drive on step; 12/16/37: RTB all directions; 2/23: squat, SLS, heel raise and stairs.       Patient will benefit from skilled therapeutic intervention in order to improve the following deficits and impairments:  Abnormal gait, Pain, Decreased strength, Difficulty walking, Decreased range of motion, Decreased balance, Decreased mobility, Decreased activity tolerance, Increased edema  Visit Diagnosis: Difficulty in walking, not elsewhere classified  Chronic pain of right knee  Pain in right ankle and joints of right foot     Problem List Patient Active Problem List   Diagnosis Date Noted  . MDD (major depressive disorder), recurrent, in  full remission ([place]) 11/22/2019  . Ankle pain, right 07/26/2019  . Encounter for completion of form with patient 12/09/2018  . Recurrent major depressive disorder, in partial remission ([place]) 11/06/2018  . Left anterior knee pain 11/18/2016  . Vitamin D deficiency 08/05/2016  . Annual physical exam 10/30/2015  . Dyspepsia 10/30/2015  . Metabolic syndrome X 10/07/8345  . Dyslipidemia 03/21/2014  . GAD (generalized anxiety disorder) 03/03/2014  . Panic attack 03/03/2014  . Depression, recurrent ([place]) 11/04/2013  . Knee pain, right 07/23/2013  . Prediabetes 05/20/2012  . MORBID OBESITY 12/27/2009  . ALLERGIC RHINITIS CAUSE UNSPECIFIED 12/27/2009   Ihor Austin, LPTA/CLT; CBIS [phone]  Aldona Lento 12/07/2019, 6:18 PM  [place] [address] [place], [place], [address] Phone: [phone]   Fax:  [phone]  Name: [name] MRN: [id number] Date of Birth: 12/03/1980

## 2019-12-11 ENCOUNTER — Other Ambulatory Visit: Payer: Self-pay | Admitting: Family Medicine

## 2019-12-13 ENCOUNTER — Telehealth: Payer: Self-pay

## 2019-12-13 MED ORDER — BUSPIRONE HCL 15 MG PO TABS: ORAL_TABLET | ORAL | 0 refills | Status: DC

## 2019-12-13 NOTE — Telephone Encounter (Signed)
Medication problem - Fax from pt's pharmacy that her insurance will only cover Buspirone for 5 mg, 10 mg, or 15 mg.  Requests change in dosage for coverage.

## 2019-12-13 NOTE — Telephone Encounter (Signed)
Medication management - Message left on pt's voicemail of change and new dosage of a 1/2 tablet of the Buspirone 15 mg, three times a day.  Pt to call back if any questions or concerns. Informed new order sent to her pharmacy.

## 2019-12-13 NOTE — Telephone Encounter (Signed)
Prescription for Buspar 15 mg sent.- Take half tablet TID.

## 2019-12-16 ENCOUNTER — Other Ambulatory Visit: Payer: Self-pay

## 2019-12-16 ENCOUNTER — Encounter: Payer: Self-pay | Admitting: Family Medicine

## 2019-12-16 ENCOUNTER — Ambulatory Visit (INDEPENDENT_AMBULATORY_CARE_PROVIDER_SITE_OTHER): Payer: BC Managed Care – PPO | Admitting: Family Medicine

## 2019-12-16 DIAGNOSIS — F3341 Major depressive disorder, recurrent, in partial remission: Secondary | ICD-10-CM

## 2019-12-16 DIAGNOSIS — E8881 Metabolic syndrome: Secondary | ICD-10-CM

## 2019-12-16 DIAGNOSIS — F411 Generalized anxiety disorder: Secondary | ICD-10-CM

## 2019-12-16 DIAGNOSIS — R7303 Prediabetes: Secondary | ICD-10-CM

## 2019-12-16 DIAGNOSIS — Z6841 Body Mass Index (BMI) 40.0 and over, adult: Secondary | ICD-10-CM

## 2019-12-16 DIAGNOSIS — E559 Vitamin D deficiency, unspecified: Secondary | ICD-10-CM | POA: Diagnosis not present

## 2019-12-16 MED ORDER — PHENTERMINE HCL 37.5 MG PO TABS: ORAL_TABLET | ORAL | 0 refills | Status: DC

## 2019-12-16 NOTE — Progress Notes (Signed)
Virtual Visit via Telephone Note  I connected with [name] on 12/16/19 at  3:40 PM EST by telephone and verified that I am speaking with the correct person using two identifiers.  Location: Patient: work Provider: office   I discussed the limitations, risks, security and privacy concerns of performing an evaluation and management service by telephone and the availability of in person appointments. I also discussed with the patient that there may be a patient responsible charge related to this service. The patient expressed understanding and agreed to proceed.   History of Present Illness: F/U chronic problems, medication review, and refill medication when necessary. Review most recent labs and order labs which are due Review preventive health and update with necessary referrals or immunizations as indicated Denies recent fever or chills. Denies sinus pressure, nasal congestion, ear pain or sore throat. Denies chest congestion, productive cough or wheezing. Denies chest pains, palpitations and leg swelling Denies abdominal pain, nausea, vomiting,diarrhea or constipation.   Denies dysuria, frequency, hesitancy or incontinence. Denies joint pain, swelling and limitation in mobility. Denies headaches, seizures, numbness, or tingling. Denies uncontrolled  depression, anxiety or insomnia. Denies skin break down or rash. Has increased activity and still working on diet       Observations/Objective: BP 130/73   Ht 5' (1.524 m)   Wt (!) 311 lb (141.1 kg)   BMI 60.74 kg/m  Good communication with no confusion and intact memory. Alert and oriented x 3 No signs of respiratory distress during speech    Assessment and Plan:  Recurrent major depressive disorder, in partial remission (HCC) Excellent response to current medication, managed by Psychiatry  GAD (generalized anxiety disorder) Controlled n current medication, treated by Psychiatry  Vitamin D deficiency Needs  to commit to weekly prescription, not at goal  MORBID OBESITY  Patient re-educated about  the importance of commitment to a  minimum of 150 minutes of exercise per week as able.  The importance of healthy food choices with portion control discussed, as well as eating regularly and within a 12 hour window most days. The need to choose "clean , green" food 50 to 75% of the time is discussed, as well as to make water the primary drink and set a goal of 64 ounces water daily.    Weight /BMI 12/16/2019 10/04/2019 07/26/2019  WEIGHT 311 lb 311 lb 312 lb  HEIGHT 5\' 0"  5\' 0"  5\' 0"   BMI 60.74 kg/m2 60.74 kg/m2 60.93 kg/m2  Some encounter information is confidential and restricted. Go to Review Flowsheets activity to see all data.   Unchanged , needs to take medication as prescribed and also to change food choice    Follow Up Instructions:    I discussed the assessment and treatment plan with the patient. The patient was provided an opportunity to ask questions and all were answered. The patient agreed with the plan and demonstrated an understanding of the instructions.   The patient was advised to call back or seek an in-person evaluation if the symptoms worsen or if the condition fails to improve as anticipated.  I provided 15 minutes of non-face-to-face time during this encounter.   , MD

## 2019-12-16 NOTE — Patient Instructions (Signed)
F/u to re evalaute weight and labs in 3 months, call if you need me sooner  Please get non fasting HBA1C, chem 7 and EGFR 3 to 5  days before appointment  Take half phentermine once daily  Weight loss goal of 3 to 4 pounds each month  It is important that you exercise regularly at least 30 minutes 5 times a week. If you develop chest pain, have severe difficulty breathing, or feel very tired, stop exercising immediately and seek medical attention   Think about what you will eat, plan ahead. Choose " clean, green, fresh or frozen" over canned, processed or packaged foods which are more sugary, salty and fatty. 70 to 75% of food eaten should be vegetables and fruit. Three meals at set times with snacks allowed between meals, but they must be fruit or vegetables. Aim to eat over a 12 hour period , example 7 am to 7 pm, and STOP after  your last meal of the day. Drink water,generally about 64 ounces per day, no other drink is as healthy. Fruit juice is best enjoyed in a healthy way, by EATING the fruit.  Thanks for choosing [facility], we consider it a privelige to serve you.

## 2019-12-18 ENCOUNTER — Encounter: Payer: Self-pay | Admitting: Family Medicine

## 2019-12-18 NOTE — Assessment & Plan Note (Signed)
Needs to commit to weekly prescription, not at goal

## 2019-12-18 NOTE — Assessment & Plan Note (Signed)
Excellent response to current medication, managed by Psychiatry

## 2019-12-18 NOTE — Assessment & Plan Note (Signed)
  Patient re-educated about  the importance of commitment to a  minimum of 150 minutes of exercise per week as able.  The importance of healthy food choices with portion control discussed, as well as eating regularly and within a 12 hour window most days. The need to choose "clean , green" food 50 to 75% of the time is discussed, as well as to make water the primary drink and set a goal of 64 ounces water daily.    Weight /BMI 12/16/2019 10/04/2019 07/26/2019  WEIGHT 311 lb 311 lb 312 lb  HEIGHT 5\' 0"  5\' 0"  5\' 0"   BMI 60.74 kg/m2 60.74 kg/m2 60.93 kg/m2  Some encounter information is confidential and restricted. Go to Review Flowsheets activity to see all data.   Unchanged , needs to take medication as prescribed and also to change food choice

## 2019-12-18 NOTE — Assessment & Plan Note (Signed)
Controlled n current medication, treated by Psychiatry

## 2020-01-03 DIAGNOSIS — M76821 Posterior tibial tendinitis, right leg: Secondary | ICD-10-CM

## 2020-01-04 NOTE — Telephone Encounter (Signed)
-----   Message from Vickki Hearing, MD sent at 01/03/2020  5:16 PM EDT ----- Extend PT 6 weeks

## 2020-01-12 ENCOUNTER — Ambulatory Visit (HOSPITAL_COMMUNITY): Payer: BC Managed Care – PPO | Admitting: Physical Therapy

## 2020-01-19 ENCOUNTER — Ambulatory Visit (HOSPITAL_COMMUNITY): Payer: BC Managed Care – PPO | Admitting: Physical Therapy

## 2020-01-22 ENCOUNTER — Other Ambulatory Visit: Payer: Self-pay | Admitting: Family Medicine

## 2020-01-25 ENCOUNTER — Encounter (HOSPITAL_COMMUNITY): Payer: Self-pay | Admitting: Physical Therapy

## 2020-01-25 ENCOUNTER — Other Ambulatory Visit: Payer: Self-pay

## 2020-01-25 ENCOUNTER — Ambulatory Visit (HOSPITAL_COMMUNITY): Payer: BC Managed Care – PPO | Attending: Orthopedic Surgery | Admitting: Physical Therapy

## 2020-01-25 DIAGNOSIS — R262 Difficulty in walking, not elsewhere classified: Secondary | ICD-10-CM

## 2020-01-25 DIAGNOSIS — G8929 Other chronic pain: Secondary | ICD-10-CM | POA: Diagnosis present

## 2020-01-25 DIAGNOSIS — M25561 Pain in right knee: Secondary | ICD-10-CM | POA: Insufficient documentation

## 2020-01-25 DIAGNOSIS — M25571 Pain in right ankle and joints of right foot: Secondary | ICD-10-CM | POA: Insufficient documentation

## 2020-01-25 NOTE — Therapy (Signed)
[facility] [address] [place], [place], [address] Phone: [phone]   Fax:  [phone]  Physical Therapy Evaluation  Patient Details  Name: [name] MRN: [id number] Date of Birth: [birth year] Referring Provider (PT):  Fuller Canada    Encounter Date: 01/25/2020  PT End of Session - 01/25/20 1557    Visit Number  1    Number of Visits  12    Date for PT Re-Evaluation  03/07/20    Authorization Type  BCBS PPO    Authorization - Visit Number  7    Authorization - Number of Visits  30    PT Start Time  0915    PT Stop Time  1000    PT Time Calculation (min)  45 min    Activity Tolerance  Patient tolerated treatment well    Behavior During Therapy  [facility] for tasks assessed/performed       Past Medical History:  Diagnosis Date  . Morbid obesity (HCC)     Past Surgical History:  Procedure Laterality Date  . CESAREAN SECTION     x 3  . TUBAL LIGATION      There were no vitals filed for this visit.   Subjective Assessment - 01/25/20 0916    Subjective  Patient complains of chronic right knee and ankle pain. States that it has been going on for at least 2 years without inury or trauma.. Over the last 2 years it has progressively worsened. States she saw the MD who performed xrays and said she had arthritis and flat feet with her right foot more so then the left. States he also recommended an orthotic and she went to [employer] and got an orthotic but she has not been able to wear it due to not having the correct shoes to wear it with.  She was seen in February/ March for six visits of physical therapy and was feeling better but the pain has returned.  She states that she continued her exercises but then she started standing on it more without resting to try and see if this would help, however, it did the opposite increasing her pain to  where she could not even stand on her legs.  She is having problems with both of her ankles  but the right more than the left.    Pertinent History  chronic knee and ankle pain, obesity, depression    How long can you sit comfortably?  no problem    How long can you stand comfortably?  15 minutes and then she has increased pain    How long can you walk comfortably?  15 minutes and then she has increased pain    Patient Stated Goals  to have less pain    Currently in Pain?  No/denies    Pain Score  7    worst pain she has had in the past week   Pain Location  Ankle    Pain Orientation  Right;Medial    Pain Descriptors / Indicators  Aching;Dull    Pain Type  Chronic pain    Pain Onset  More than a month ago    Aggravating Factors   weight bearing    Pain Relieving Factors  medication helps 25 %    Effect of Pain on Daily Activities  limits ADL's; pt needs to rest 4-5 times when she is cleaning her house.         [facility] PT Assessment - 01/25/20  0001      Assessment   Medical Diagnosis  posterior tibial tendon dysfunction    Referring Provider (PT)   Fuller Canada     Next MD Visit  not scheduled     Prior Therapy  no      Precautions   Precautions  None      Restrictions   Weight Bearing Restrictions  No      Home Environment   Living Environment  Private residence    Living Arrangements  Children;Spouse/significant other    Available Help at Discharge  Family    Type of Home  House    Home Access  Stairs to enter    Entrance Stairs-Number of Steps  6      Prior Function   Level of Independence  Independent    Vocation  Full time employment    Vocation Requirements  sitting at desk for most of shift      Cognition   Overall Cognitive Status  Within Functional Limits for tasks assessed      Observation/Other Assessments   Focus on Therapeutic Outcomes (FOTO)   44; 46% limited       Observation/Other Assessments-Edema    Edema  Figure 8;Circumferential      Circumferential Edema   Circumferential - Right  26.5cm   at ankle   Circumferential - Left    26.0cm   at ankle     Figure 8 Edema   Figure 8 - Right   55.5cm     Figure 8 - Left   54.0       Functional Tests   Functional tests  Single leg stance;Sit to Stand      Single Leg Stance   Comments  Rt: 0; Lt 45"       Sit to Stand   Comments  6 in 30 seconds       ROM / Strength   AROM / PROM / Strength  AROM;Strength      AROM   Overall AROM Comments  ankle is functional     Right Ankle Dorsiflexion  --    Right Ankle Plantar Flexion  --    Left Ankle Dorsiflexion  --    Left Ankle Plantar Flexion  --      Strength   Right Hip Flexion  4+/5    Left Hip Flexion  4+/5    Right Knee Flexion  --    Right Knee Extension  5/5    Left Knee Flexion  --    Left Knee Extension  5/5    Right Ankle Dorsiflexion  5/5    Right Ankle Plantar Flexion  2+/5   unable to complete one heel raise through full ROM    Right Ankle Inversion  4-/5    Right Ankle Eversion  4-/5    Left Ankle Dorsiflexion  5/5    Left Ankle Plantar Flexion  3-/5   unable to complete one heel raise through full ROM    Left Ankle Inversion  4-/5    Left Ankle Eversion  4+/5      Palpation   Palpation comment  tenderness to palpation along right medial calf, ankle and along joint line of right knee and right distal adductors      Balance   Balance Assessed  Yes                Objective measurements completed on examination: See above findings.  [facility] Adult PT Treatment/Exercise - 01/25/20 0001      Exercises   Exercises  Ankle      Ankle Exercises: Standing   SLS  x3 reps       Ankle Exercises: Supine   Isometrics  x5 reps     T-Band  for all motion              PT Education - 01/25/20 1555    Education Details  HEP SLS; pt given blue tband for ankle isometrics states she has the exercise sheet at home; the benefit of compression garments    Person(s) Educated  Patient    Methods  Explanation    Comprehension  Verbalized understanding;Returned demonstration        PT Short Term Goals - 01/25/20 1606      PT SHORT TERM GOAL #1   Title  Pt to be I in HEP to decrease Lt ankle pain to no greater tahn 5/10    Time  3    Period  Weeks    Status  New    Target Date  02/15/20      PT SHORT TERM GOAL #2   Title  PT to be able to stand/walk for 30 minutes without having to take a rest break    Time  3    Period  Weeks    Status  New        PT Long Term Goals - 01/25/20 1607      PT LONG TERM GOAL #1   Title  PT pain to be no greater than a 2/10 to allow her to be able to stand/walk for an hour at a time without having to take breaks.    Time  6    Period  Weeks    Status  New    Target Date  03/07/20      PT LONG TERM GOAL #2   Title  PT ankle strength to be a 5/5 to allow pt to be able to go up and down 8 steps in a reciprocal manner.    Time  6    Period  Weeks    Status  New      PT LONG TERM GOAL #3   Title  Pt to be able to single leg stance for 30" on her Rt LE to decrease risk of falls.    Time  6    Period  Weeks    Status  New             Plan - 01/25/20 1559    Clinical Impression Statement  Ms. Waterfield is a 39 yo female who has chronic ankle pain Rt greater thatn the left.  She is unable to increase her activity level without experiencing significant increased pain, therefore she has been referred to skilled PT.  Evaluation demonstrates increased pain of ankles with weight bearing, decreased balance of Rt LE, decreased strength and decreasd functional abiltiy.    Personal Factors and Comorbidities  Comorbidity 1    Comorbidities  obesity, depression, chronic knee and ankle pain    Examination-Activity Limitations  Stand;Stairs;Squat;Sit;Locomotion Level;Carry;Transfers    Examination-Participation Restrictions  Cleaning;Shop;Community Activity;Laundry;Meal Prep    Stability/Clinical Decision Making  Stable/Uncomplicated    Rehab Potential  Fair    PT Frequency  2x / week    PT Duration  6 weeks    PT  Treatment/Interventions  ADLs/Self Care Home Management;Aquatic Therapy;Cryotherapy;Electrical Stimulation;Moist Heat;Traction;Balance training;Therapeutic exercise;Therapeutic activities;Functional mobility  training;Stair training;Gait training;Orthotic Fit/Training;Ultrasound;Neuromuscular re-education;Patient/family education;Compression bandaging;Passive range of motion;Dry needling    PT Next Visit Plan  Begin rocker board, heel raises toe raises, baps board tandem gt progress to higher level such as warrior poses, shuffle and grapevine    PT Home Exercise Plan  4/13:  Tband for ankle using blue tband, SLS       Patient will benefit from skilled therapeutic intervention in order to improve the following deficits and impairments:  Abnormal gait, Pain, Decreased strength, Difficulty walking, Decreased range of motion, Decreased balance, Decreased mobility, Decreased activity tolerance, Increased edema  Visit Diagnosis: Chronic pain of right knee - Plan: PT plan of care cert/re-cert  Pain in right ankle and joints of right foot - Plan: PT plan of care cert/re-cert  Difficulty in walking, not elsewhere classified - Plan: PT plan of care cert/re-cert     Problem List Patient Active Problem List   Diagnosis Date Noted  . MDD (major depressive disorder), recurrent, in full remission (HCC) 11/22/2019  . Ankle pain, right 07/26/2019  . Recurrent major depressive disorder, in partial remission (HCC) 11/06/2018  . Left anterior knee pain 11/18/2016  . Vitamin D deficiency 08/05/2016  . Dyspepsia 10/30/2015  . Metabolic syndrome X 03/21/2014  . Dyslipidemia 03/21/2014  . GAD (generalized anxiety disorder) 03/03/2014  . Panic attack 03/03/2014  . Knee pain, right 07/23/2013  . Prediabetes 05/20/2012  . MORBID OBESITY 12/27/2009  . ALLERGIC RHINITIS CAUSE UNSPECIFIED 12/27/2009   Virgina Organ, PT CLT [phone] 01/25/2020, 4:13 PM  [facility] [address] [place], [place], [address] Phone: [phone]   Fax:  [phone]  Name: [name] MRN: [id number] Date of Birth: 26-Dec-1980

## 2020-01-27 ENCOUNTER — Encounter (HOSPITAL_COMMUNITY): Payer: BC Managed Care – PPO | Admitting: Physical Therapy

## 2020-01-28 ENCOUNTER — Other Ambulatory Visit: Payer: Self-pay

## 2020-01-28 ENCOUNTER — Ambulatory Visit (HOSPITAL_COMMUNITY): Payer: BC Managed Care – PPO

## 2020-01-28 ENCOUNTER — Telehealth: Payer: Self-pay | Admitting: Orthopedic Surgery

## 2020-01-28 ENCOUNTER — Encounter (HOSPITAL_COMMUNITY): Payer: Self-pay

## 2020-01-28 DIAGNOSIS — G8929 Other chronic pain: Secondary | ICD-10-CM

## 2020-01-28 DIAGNOSIS — M25571 Pain in right ankle and joints of right foot: Secondary | ICD-10-CM

## 2020-01-28 DIAGNOSIS — R262 Difficulty in walking, not elsewhere classified: Secondary | ICD-10-CM

## 2020-01-28 DIAGNOSIS — M25561 Pain in right knee: Secondary | ICD-10-CM | POA: Diagnosis not present

## 2020-01-28 NOTE — Therapy (Signed)
[facility] [address] [place], [place], [address] Phone: [phone]   Fax:  [phone]  Physical Therapy Treatment  Patient Details  Name: [name] MRN: [id number] Date of Birth: 1981-09-14 Referring Provider (PT):  Fuller Canada    Encounter Date: 01/28/2020  PT End of Session - 01/28/20 1716    Visit Number  2    Number of Visits  12    Date for PT Re-Evaluation  03/07/20    Authorization Type  BCBS PPO    Authorization - Visit Number  8    Authorization - Number of Visits  30    PT Start Time  1712   pt late for apt   PT Stop Time  1751    PT Time Calculation (min)  39 min    Activity Tolerance  Patient tolerated treatment well    Behavior During Therapy  [facility] for tasks assessed/performed       Past Medical History:  Diagnosis Date  . Morbid obesity (HCC)     Past Surgical History:  Procedure Laterality Date  . CESAREAN SECTION     x 3  . TUBAL LIGATION      There were no vitals filed for this visit.  Subjective Assessment - 01/28/20 1714    Subjective  Pt stated she has began the HEP exercises without question.  Pain in Rt ankle is 2/10 sore achey pain.    Pertinent History  chronic knee and ankle pain, obesity, depression    Patient Stated Goals  to have less pain    Currently in Pain?  Yes    Pain Score  2     Pain Location  Ankle    Pain Orientation  Right;Anterior;Medial    Pain Descriptors / Indicators  Aching;Sore    Pain Onset  More than a month ago    Pain Frequency  Intermittent    Aggravating Factors   weight bearing    Pain Relieving Factors  medicaion helps 25%, "walk is out"    Effect of Pain on Daily Activities  limites ADLs; pt need to rest 4-5 times when she is cleaning her house.                       [facility] Adult PT Treatment/Exercise - 01/28/20 0001      Exercises   Exercises  Ankle      Knee/Hip Exercises: Standing   Heel Raises  10 reps    Heel Raises  Limitations  toe raises 10x    Rocker Board  2 minutes    Rocker Board Limitations  lateral      Ankle Exercises: Standing   BAPS  Level 2;Standing;5 reps   DF/PF, Inv/Ev   SLS  Lt 32", Rt 20" max of 3     Rocker Board  2 minutes    Rocker Board Limitations  lateral    Heel Raises  10 reps    Toe Raise  10 reps    Other Standing Ankle Exercises  tandem gait 1RT (SBA)             PT Education - 01/28/20 1723    Education Details  Reviewed goals and assured compliance with HEP, cueing for correct form and mechanics with theraband    Person(s) Educated  Patient    Methods  Explanation    Comprehension  Verbalized understanding;Returned demonstration;Verbal cues required       PT  Short Term Goals - 01/25/20 1606      PT SHORT TERM GOAL #1   Title  Pt to be I in HEP to decrease Lt ankle pain to no greater tahn 5/10    Time  3    Period  Weeks    Status  New    Target Date  02/15/20      PT SHORT TERM GOAL #2   Title  PT to be able to stand/walk for 30 minutes without having to take a rest break    Time  3    Period  Weeks    Status  New        PT Long Term Goals - 01/25/20 1607      PT LONG TERM GOAL #1   Title  PT pain to be no greater than a 2/10 to allow her to be able to stand/walk for an hour at a time without having to take breaks.    Time  6    Period  Weeks    Status  New    Target Date  03/07/20      PT LONG TERM GOAL #2   Title  PT ankle strength to be a 5/5 to allow pt to be able to go up and down 8 steps in a reciprocal manner.    Time  6    Period  Weeks    Status  New      PT LONG TERM GOAL #3   Title  Pt to be able to single leg stance for 30" on her Rt LE to decrease risk of falls.    Time  6    Period  Weeks    Status  New            Plan - 01/28/20 1718    Clinical Impression Statement  Reviewed goals, educated importance of compliance wiht HEP.  Pt required some guidance for proper form and mechanics iwth theraband exercises.   Session focus on ankle strengthening and stability in closed chain exercises.  Some cueing required to improve ankle stability and reduce assistance from hip movements for maximal benefits.  No reoprts of pain.    Personal Factors and Comorbidities  Comorbidity 1    Comorbidities  obesity, depression, chronic knee and ankle pain    Examination-Activity Limitations  Stand;Stairs;Squat;Sit;Locomotion Level;Carry;Transfers    Examination-Participation Restrictions  Cleaning;Shop;Community Activity;Laundry;Meal Prep    Stability/Clinical Decision Making  Stable/Uncomplicated    Clinical Decision Making  Low    Rehab Potential  Fair    PT Frequency  2x / week    PT Duration  6 weeks    PT Treatment/Interventions  ADLs/Self Care Home Management;Aquatic Therapy;Cryotherapy;Electrical Stimulation;Moist Heat;Traction;Balance training;Therapeutic exercise;Therapeutic activities;Functional mobility training;Stair training;Gait training;Orthotic Fit/Training;Ultrasound;Neuromuscular re-education;Patient/family education;Compression bandaging;Passive range of motion;Dry needling    PT Next Visit Plan  Continue rocker board, heel raises toe raises, baps board tandem gt progress to higher level such as warrior poses, shuffle and grapevine    PT Home Exercise Plan  4/13:  Tband for ankle using blue tband, SLS       Patient will benefit from skilled therapeutic intervention in order to improve the following deficits and impairments:  Abnormal gait, Pain, Decreased strength, Difficulty walking, Decreased range of motion, Decreased balance, Decreased mobility, Decreased activity tolerance, Increased edema  Visit Diagnosis: Chronic pain of right knee  Pain in right ankle and joints of right foot  Difficulty in walking, not elsewhere classified  Problem List Patient Active Problem List   Diagnosis Date Noted  . MDD (major depressive disorder), recurrent, in full remission ([place]) 11/22/2019  . Ankle  pain, right 07/26/2019  . Recurrent major depressive disorder, in partial remission ([place]) 11/06/2018  . Left anterior knee pain 11/18/2016  . Vitamin D deficiency 08/05/2016  . Dyspepsia 10/30/2015  . Metabolic syndrome X 88/50/2774  . Dyslipidemia 03/21/2014  . GAD (generalized anxiety disorder) 03/03/2014  . Panic attack 03/03/2014  . Knee pain, right 07/23/2013  . Prediabetes 05/20/2012  . MORBID OBESITY 12/27/2009  . ALLERGIC RHINITIS CAUSE UNSPECIFIED 12/27/2009   Ihor Austin, LPTA/CLT; CBIS [phone]  Aldona Lento 01/28/2020, 5:58 PM  [facility] [address] [place], [place], [address] Phone: [phone]   Fax:  [phone]  Name: SHEZA STRICKLAND MRN: [id number] Date of Birth: 09-07-1981

## 2020-01-28 NOTE — Telephone Encounter (Signed)
Called patient regarding forms received in fax from [facility] [employer] [facility]. Patient states she 'needs to start over again' with forms process through our forms provider, [employer]. Understands, and will come to office as soon as possible.

## 2020-02-01 ENCOUNTER — Ambulatory Visit (HOSPITAL_COMMUNITY): Payer: BC Managed Care – PPO

## 2020-02-01 ENCOUNTER — Encounter (HOSPITAL_COMMUNITY): Payer: BC Managed Care – PPO

## 2020-02-01 ENCOUNTER — Encounter (HOSPITAL_COMMUNITY): Payer: Self-pay

## 2020-02-01 ENCOUNTER — Other Ambulatory Visit: Payer: Self-pay

## 2020-02-01 DIAGNOSIS — R262 Difficulty in walking, not elsewhere classified: Secondary | ICD-10-CM

## 2020-02-01 DIAGNOSIS — M25561 Pain in right knee: Secondary | ICD-10-CM | POA: Diagnosis not present

## 2020-02-01 DIAGNOSIS — M25571 Pain in right ankle and joints of right foot: Secondary | ICD-10-CM

## 2020-02-01 DIAGNOSIS — G8929 Other chronic pain: Secondary | ICD-10-CM

## 2020-02-01 NOTE — Therapy (Signed)
[facility] [address] [place], [place], [address] Phone: [phone]   Fax:  [phone]  Physical Therapy Treatment  Patient Details  Name: [name] MRN: [id number] Date of Birth: 10-Jun-1981 Referring Provider (PT):  Fuller Canada    Encounter Date: 02/01/2020  PT End of Session - 02/01/20 0838    Visit Number  3    Number of Visits  12    Date for PT Re-Evaluation  03/07/20    Authorization Type  BCBS PPO    Authorization - Visit Number  9    Authorization - Number of Visits  30    PT Start Time  [phone]    PT Stop Time  0912    PT Time Calculation (min)  38 min    Activity Tolerance  Patient tolerated treatment well    Behavior During Therapy  [facility] for tasks assessed/performed       Past Medical History:  Diagnosis Date  . Morbid obesity (HCC)     Past Surgical History:  Procedure Laterality Date  . CESAREAN SECTION     x 3  . TUBAL LIGATION      There were no vitals filed for this visit.  Subjective Assessment - 02/01/20 0837    Subjective  Pt reports some soreness and swelling following last session, elevated and applied ice that did help.  Threw a birthday party this weekend for her foster son, had a great time.  No reports of pain currenlty.  Has been compliant with HEP.    Pertinent History  chronic knee and ankle pain, obesity, depression    Patient Stated Goals  to have less pain    Currently in Pain?  No/denies                       [facility] Adult PT Treatment/Exercise - 02/01/20 0001      Exercises   Exercises  Ankle      Ankle Exercises: Standing   BAPS  Level 2;Standing;5 reps    SLS  Lt 22", Rt 16" max of 3    Rocker Board  2 minutes    Rocker Board Limitations  lateral    Heel Raises  10 reps;3 seconds   2 sets   Toe Raise  10 reps;3 seconds   2 sets   Other Standing Ankle Exercises  tandem gait 2RT (SBA) Warrrior I and II 2x 30"    Other Standing Ankle Exercises  arch  formation      Ankle Exercises: Seated   Towel Inversion/Eversion  2 reps    Other Seated Ankle Exercises  arch formation 5x with cueiing               PT Short Term Goals - 01/25/20 1606      PT SHORT TERM GOAL #1   Title  Pt to be I in HEP to decrease Lt ankle pain to no greater tahn 5/10    Time  3    Period  Weeks    Status  New    Target Date  02/15/20      PT SHORT TERM GOAL #2   Title  PT to be able to stand/walk for 30 minutes without having to take a rest break    Time  3    Period  Weeks    Status  New        PT Long Term Goals - 01/25/20 1607  PT LONG TERM GOAL #1   Title  PT pain to be no greater than a 2/10 to allow her to be able to stand/walk for an hour at a time without having to take breaks.    Time  6    Period  Weeks    Status  New    Target Date  03/07/20      PT LONG TERM GOAL #2   Title  PT ankle strength to be a 5/5 to allow pt to be able to go up and down 8 steps in a reciprocal manner.    Time  6    Period  Weeks    Status  New      PT LONG TERM GOAL #3   Title  Pt to be able to single leg stance for 30" on her Rt LE to decrease risk of falls.    Time  6    Period  Weeks    Status  New            Plan - 02/01/20 0912    Clinical Impression Statement  Session focus wiht ankle stability and strengthening.  Pt improved mechanics and control, did c/o burning in arch during inversion/eversion on BAPS.  Added arch formation and intrisic musculature strengthening to address this weakness with reports of relief following new exercises.  Progressed balance and stability wiht additional warrior poses.  No reports of pain at EOS.    Comorbidities  obesity, depression, chronic knee and ankle pain    Examination-Activity Limitations  Stand;Stairs;Squat;Sit;Locomotion Level;Carry;Transfers    Examination-Participation Restrictions  Cleaning;Shop;Community Activity;Laundry;Meal Prep    Stability/Clinical Decision Making   Stable/Uncomplicated    Clinical Decision Making  Low    Rehab Potential  Fair    PT Frequency  2x / week    PT Duration  6 weeks    PT Treatment/Interventions  ADLs/Self Care Home Management;Aquatic Therapy;Cryotherapy;Electrical Stimulation;Moist Heat;Traction;Balance training;Therapeutic exercise;Therapeutic activities;Functional mobility training;Stair training;Gait training;Orthotic Fit/Training;Ultrasound;Neuromuscular re-education;Patient/family education;Compression bandaging;Passive range of motion;Dry needling    PT Next Visit Plan  Continue heel raises toe raises, baps board tandem gt progress to higher level such as warrior poses, shuffle and grapevine    PT Home Exercise Plan  4/13:  Tband for ankle using blue tband, SLS       Patient will benefit from skilled therapeutic intervention in order to improve the following deficits and impairments:  Abnormal gait, Pain, Decreased strength, Difficulty walking, Decreased range of motion, Decreased balance, Decreased mobility, Decreased activity tolerance, Increased edema  Visit Diagnosis: Pain in right ankle and joints of right foot  Difficulty in walking, not elsewhere classified  Chronic pain of right knee     Problem List Patient Active Problem List   Diagnosis Date Noted  . MDD (major depressive disorder), recurrent, in full remission ([place]) 11/22/2019  . Ankle pain, right 07/26/2019  . Recurrent major depressive disorder, in partial remission ([place]) 11/06/2018  . Left anterior knee pain 11/18/2016  . Vitamin D deficiency 08/05/2016  . Dyspepsia 10/30/2015  . Metabolic syndrome X 94/17/4081  . Dyslipidemia 03/21/2014  . GAD (generalized anxiety disorder) 03/03/2014  . Panic attack 03/03/2014  . Knee pain, right 07/23/2013  . Prediabetes 05/20/2012  . MORBID OBESITY 12/27/2009  . ALLERGIC RHINITIS CAUSE UNSPECIFIED 12/27/2009   Ihor Austin, LPTA/CLT; CBIS [phone]  Aldona Lento 02/01/2020, 10:19  AM  [place], [place], [address] Phone: [phone]   Fax:  [phone]  Name: [name] MRN: [id number] Date of Birth: [birth year]

## 2020-02-02 ENCOUNTER — Encounter: Payer: Self-pay | Admitting: Orthopedic Surgery

## 2020-02-03 ENCOUNTER — Encounter (HOSPITAL_COMMUNITY): Payer: BC Managed Care – PPO

## 2020-02-03 ENCOUNTER — Ambulatory Visit (HOSPITAL_COMMUNITY): Payer: BC Managed Care – PPO | Admitting: Physical Therapy

## 2020-02-03 ENCOUNTER — Telehealth (HOSPITAL_COMMUNITY): Payer: Self-pay | Admitting: Physical Therapy

## 2020-02-03 NOTE — Telephone Encounter (Signed)
pt called to cx due to she will be running late

## 2020-02-08 ENCOUNTER — Telehealth (HOSPITAL_COMMUNITY): Payer: Self-pay | Admitting: Physical Therapy

## 2020-02-08 ENCOUNTER — Ambulatory Visit (HOSPITAL_COMMUNITY): Payer: BC Managed Care – PPO | Admitting: Physical Therapy

## 2020-02-08 ENCOUNTER — Encounter (HOSPITAL_COMMUNITY): Payer: BC Managed Care – PPO

## 2020-02-08 NOTE — Telephone Encounter (Signed)
Pt is coughing, sneezing and has a Holiday representative - she will quarantine for 14 days and return on May 11th.

## 2020-02-10 ENCOUNTER — Ambulatory Visit (HOSPITAL_COMMUNITY): Payer: BC Managed Care – PPO

## 2020-02-10 ENCOUNTER — Encounter (HOSPITAL_COMMUNITY): Payer: BC Managed Care – PPO | Admitting: Physical Therapy

## 2020-02-15 ENCOUNTER — Encounter (HOSPITAL_COMMUNITY): Payer: BC Managed Care – PPO

## 2020-02-15 ENCOUNTER — Ambulatory Visit (HOSPITAL_COMMUNITY): Payer: BC Managed Care – PPO | Admitting: Physical Therapy
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
## 2020-02-17 ENCOUNTER — Encounter (HOSPITAL_COMMUNITY): Payer: BC Managed Care – PPO

## 2020-02-17 ENCOUNTER — Encounter (HOSPITAL_COMMUNITY): Payer: BC Managed Care – PPO | Admitting: Physical Therapy

## 2020-02-21 ENCOUNTER — Other Ambulatory Visit: Payer: Self-pay

## 2020-02-21 ENCOUNTER — Encounter (HOSPITAL_COMMUNITY): Payer: Self-pay | Admitting: Psychiatry

## 2020-02-21 ENCOUNTER — Telehealth (INDEPENDENT_AMBULATORY_CARE_PROVIDER_SITE_OTHER): Payer: BC Managed Care – PPO | Admitting: Psychiatry

## 2020-02-21 DIAGNOSIS — F3342 Major depressive disorder, recurrent, in full remission: Secondary | ICD-10-CM

## 2020-02-21 MED ORDER — BUSPIRONE HCL 15 MG PO TABS: ORAL_TABLET | ORAL | 0 refills | Status: DC

## 2020-02-21 MED ORDER — SERTRALINE HCL 50 MG PO TABS: 50.0000 mg | ORAL_TABLET | Freq: Every day | ORAL | 0 refills | Status: DC

## 2020-02-21 NOTE — Patient Instructions (Signed)
1. Continue sertraline 50 mg daily  2. Continue Buspar 7.5 mg three timesa day  3.Next appointment: 8/2 at 2:30

## 2020-02-22 ENCOUNTER — Telehealth (HOSPITAL_COMMUNITY): Payer: Self-pay

## 2020-02-22 ENCOUNTER — Encounter (HOSPITAL_COMMUNITY): Payer: BC Managed Care – PPO

## 2020-02-22 ENCOUNTER — Ambulatory Visit (HOSPITAL_COMMUNITY): Payer: BC Managed Care – PPO | Attending: Orthopedic Surgery

## 2020-02-22 DIAGNOSIS — M25561 Pain in right knee: Secondary | ICD-10-CM | POA: Insufficient documentation

## 2020-02-22 DIAGNOSIS — R262 Difficulty in walking, not elsewhere classified: Secondary | ICD-10-CM | POA: Insufficient documentation

## 2020-02-22 DIAGNOSIS — G8929 Other chronic pain: Secondary | ICD-10-CM | POA: Insufficient documentation

## 2020-02-22 DIAGNOSIS — M25571 Pain in right ankle and joints of right foot: Secondary | ICD-10-CM | POA: Insufficient documentation

## 2020-02-22 NOTE — Telephone Encounter (Signed)
No show, called and spoke to pt. who stated she thought her apt was scheduled for Thursday.  Stated she wouldn't have been able to come today due to stomach flu, stated her fever broke a couple days ago.  Reminded next apt date and time and contact information given.  Educated no show policy with verbalized understanding.    Becky Sax, LPTA/CLT; [facility] [phone]

## 2020-02-23 ENCOUNTER — Telehealth (HOSPITAL_COMMUNITY): Payer: Self-pay | Admitting: Physical Therapy

## 2020-02-23 NOTE — Telephone Encounter (Signed)
[employer] 2021 Benefits No DED, no auth (includes AQ) OOP 900 (103.69met) Co-pay 10 Co-ins 10% Visit limit 30 S/W Joyice Faster LUN#[id number]

## 2020-02-24 ENCOUNTER — Ambulatory Visit (HOSPITAL_COMMUNITY): Payer: BC Managed Care – PPO | Admitting: Physical Therapy

## 2020-02-24 ENCOUNTER — Encounter (HOSPITAL_COMMUNITY): Payer: BC Managed Care – PPO

## 2020-02-29 ENCOUNTER — Ambulatory Visit (HOSPITAL_COMMUNITY): Payer: BC Managed Care – PPO | Admitting: Physical Therapy

## 2020-02-29 ENCOUNTER — Other Ambulatory Visit: Payer: Self-pay

## 2020-02-29 ENCOUNTER — Encounter (HOSPITAL_COMMUNITY): Payer: Self-pay | Admitting: Physical Therapy

## 2020-02-29 ENCOUNTER — Encounter (HOSPITAL_COMMUNITY): Payer: BC Managed Care – PPO

## 2020-02-29 DIAGNOSIS — M25561 Pain in right knee: Secondary | ICD-10-CM | POA: Diagnosis present

## 2020-02-29 DIAGNOSIS — R262 Difficulty in walking, not elsewhere classified: Secondary | ICD-10-CM

## 2020-02-29 DIAGNOSIS — G8929 Other chronic pain: Secondary | ICD-10-CM | POA: Diagnosis present

## 2020-02-29 DIAGNOSIS — M25571 Pain in right ankle and joints of right foot: Secondary | ICD-10-CM | POA: Diagnosis present

## 2020-02-29 NOTE — Therapy (Signed)
[facility] [address] [place], [place], [address] Phone: [phone]   Fax:  [phone]  Physical Therapy Treatment / Progress Note  Patient Details  Name: [name] MRN: [id number] Date of Birth: 06-Jan-1981 Referring Provider (PT):  Fuller Canada    Encounter Date: 02/29/2020   Progress Note Reporting Period 01/25/20 to 02/29/20  See note below for Objective Data and Assessment of Progress/Goals.       PT End of Session - 02/29/20 0827    Visit Number  4    Number of Visits  12    Date for PT Re-Evaluation  03/21/20    Authorization Type  BCBS PPO    Authorization - Visit Number  10    Authorization - Number of Visits  30    PT Start Time  0817    PT Stop Time  0855    PT Time Calculation (min)  38 min    Activity Tolerance  Patient tolerated treatment well    Behavior During Therapy  WFL for tasks assessed/performed       Past Medical History:  Diagnosis Date  . Morbid obesity (HCC)     Past Surgical History:  Procedure Laterality Date  . CESAREAN SECTION     x 3  . TUBAL LIGATION      There were no vitals filed for this visit.  Subjective Assessment - 02/29/20 0819    Subjective  Patient reported that she feels her ankle is getting better with therapy. Patient denied any pain currently. Patient reports a maximum of 2/10 pain over the last week. Patient reports some difficulty with her right knee still, but mostly her ankle is giving her a problem.    Pertinent History  chronic knee and ankle pain, obesity, depression    How long can you stand comfortably?  30 minutes    How long can you walk comfortably?  30 minutes    Patient Stated Goals  to have less pain    Currently in Pain?  No/denies         [facility] PT Assessment - 02/29/20 0001      Assessment   Medical Diagnosis  posterior tibial tendon dysfunction    Referring Provider (PT)   Fuller Canada       Prior Function   Level of  Independence  Independent    Vocation  Full time employment      Cognition   Overall Cognitive Status  Within Functional Limits for tasks assessed      Strength   Right Ankle Dorsiflexion  5/5    Right Ankle Plantar Flexion  3-/5   was 2+   Right Ankle Inversion  5/5    Right Ankle Eversion  5/5      Ambulation/Gait   Gait Comments  Ascending and descending 8 7'' stairs with slow ascending resting on each step and intermittent descending stairs with reciprocal pattern      Static Standing Balance   Static Standing - Comment/# of Minutes  SLS: 23 seconds on RT                    [facility] Adult PT Treatment/Exercise - 02/29/20 0001      Knee/Hip Exercises: Standing   Heel Raises  10 reps    Step Down  Right;Left;1 set;10 reps;Hand Hold: 2;Step Height: 6"    Rocker Board  2 minutes    Rocker Board Limitations  lateral  SLS  RT: 23''    SLS with Vectors  5X5" each with intermittent 1 HHA    Other Standing Knee Exercises  2 x 30" each on foam       Knee/Hip Exercises: Seated   Other Seated Knee/Hip Exercises  Seated PF strengthening with Red Tubular band 15x               PT Short Term Goals - 02/29/20 2355      PT SHORT TERM GOAL #1   Title  Pt to be I in HEP to decrease Lt ankle pain to no greater tahn 5/10    Baseline  02/29/20: Patient reports maximum of 2/10 over the last week    Time  3    Period  Weeks    Status  Achieved    Target Date  02/15/20      PT SHORT TERM GOAL #2   Title  PT to be able to stand/walk for 30 minutes without having to take a rest break    Baseline  02/29/20: Patient reports ability to stand/walk for 30 minutes    Time  3    Period  Weeks    Status  Achieved        PT Long Term Goals - 02/29/20 7322      PT LONG TERM GOAL #1   Title  PT pain to be no greater than a 2/10 to allow her to be able to stand/walk for an hour at a time without having to take breaks.    Baseline  02/29/20: Patient reports maximum pain of  2/10 over the last week, but not able to ambulate for an hour at this time    Time  6    Period  Weeks    Status  On-going      PT LONG TERM GOAL #2   Title  PT ankle strength to be a 5/5 to allow pt to be able to go up and down 8 steps in a reciprocal manner.    Baseline  02/29/20: Achieved in some but not all muscle groups, intermittent reciprocal pattern    Time  6    Period  Weeks    Status  On-going      PT LONG TERM GOAL #3   Title  Pt to be able to single leg stance for 30" on her Rt LE to decrease risk of falls.    Baseline  02/29/20: 23 seconds    Time  6    Period  Weeks    Status  On-going            Plan - 02/29/20 0827    Clinical Impression Statement  Checked patient's goals this session and patient had made some progress towards goals and reports an improvement since beginning therapy, but continued to demonstrate deficits in strength and balance of the right ankle which continue to interfere with her function. Plan to extend the patient for an additional 3 weeks of therapy to continue addressing these deficits. Added step downs for eccentric strengthening this session.    Comorbidities  obesity, depression, chronic knee and ankle pain    Examination-Activity Limitations  Stand;Stairs;Squat;Sit;Locomotion Level;Carry;Transfers    Examination-Participation Restrictions  Cleaning;Shop;Community Activity;Laundry;Meal Prep    Stability/Clinical Decision Making  Stable/Uncomplicated    Rehab Potential  Fair    PT Frequency  2x / week    PT Duration  3 weeks    PT Treatment/Interventions  ADLs/Self Care Home Management;Aquatic  Therapy;Cryotherapy;Electrical Stimulation;Moist Heat;Traction;Balance training;Therapeutic exercise;Therapeutic activities;Functional mobility training;Stair training;Gait training;Orthotic Fit/Training;Ultrasound;Neuromuscular re-education;Patient/family education;Compression bandaging;Passive range of motion;Dry needling    PT Next Visit Plan   Focus on RT ankle PF strengthening and higher level balance progress to higher level such as warrior poses, shuffle and grapevine    PT Home Exercise Plan  4/13:  Tband for ankle using blue tband, SLS    Consulted and Agree with Plan of Care  Patient       Patient will benefit from skilled therapeutic intervention in order to improve the following deficits and impairments:  Abnormal gait, Pain, Decreased strength, Difficulty walking, Decreased range of motion, Decreased balance, Decreased mobility, Decreased activity tolerance, Increased edema  Visit Diagnosis: Pain in right ankle and joints of right foot  Difficulty in walking, not elsewhere classified  Chronic pain of right knee     Problem List Patient Active Problem List   Diagnosis Date Noted  . MDD (major depressive disorder), recurrent, in full remission ([place]) 11/22/2019  . Ankle pain, right 07/26/2019  . Recurrent major depressive disorder, in partial remission ([place]) 11/06/2018  . Left anterior knee pain 11/18/2016  . Vitamin D deficiency 08/05/2016  . Dyspepsia 10/30/2015  . Metabolic syndrome X 56/31/4970  . Dyslipidemia 03/21/2014  . GAD (generalized anxiety disorder) 03/03/2014  . Panic attack 03/03/2014  . Knee pain, right 07/23/2013  . Prediabetes 05/20/2012  . MORBID OBESITY 12/27/2009  . ALLERGIC RHINITIS CAUSE UNSPECIFIED 12/27/2009   Clarene Critchley PT, DPT 8:53 AM, 02/29/20 [place], [place], [address] Phone: [phone]   Fax:  [phone]  Name: [name] MRN: [id number] Date of Birth: 09/12/1981

## 2020-03-02 ENCOUNTER — Other Ambulatory Visit: Payer: Self-pay

## 2020-03-02 ENCOUNTER — Ambulatory Visit (HOSPITAL_COMMUNITY): Payer: BC Managed Care – PPO

## 2020-03-02 ENCOUNTER — Encounter (HOSPITAL_COMMUNITY): Payer: Self-pay

## 2020-03-02 ENCOUNTER — Encounter (HOSPITAL_COMMUNITY): Payer: BC Managed Care – PPO | Admitting: Physical Therapy

## 2020-03-02 DIAGNOSIS — M25571 Pain in right ankle and joints of right foot: Secondary | ICD-10-CM | POA: Diagnosis not present

## 2020-03-02 DIAGNOSIS — R262 Difficulty in walking, not elsewhere classified: Secondary | ICD-10-CM

## 2020-03-02 DIAGNOSIS — G8929 Other chronic pain: Secondary | ICD-10-CM

## 2020-03-02 NOTE — Therapy (Signed)
[facility] [address] [place], [place], [address] Phone: [phone]   Fax:  [phone]  Physical Therapy Treatment  Patient Details  Name: [name] MRN: [id number] Date of Birth: 01/18/1981 Referring Provider (PT):  Fuller Canada    Encounter Date: 03/02/2020  PT End of Session - 03/02/20 0835    Visit Number  5    Number of Visits  12    Date for PT Re-Evaluation  03/21/20    Authorization Type  BCBS PPO    Authorization - Visit Number  11    Authorization - Number of Visits  30    PT Start Time  [phone]    PT Stop Time  0915    PT Time Calculation (min)  43 min    Activity Tolerance  Patient tolerated treatment well    Behavior During Therapy  [facility] for tasks assessed/performed       Past Medical History:  Diagnosis Date  . Morbid obesity (HCC)     Past Surgical History:  Procedure Laterality Date  . CESAREAN SECTION     x 3  . TUBAL LIGATION      There were no vitals filed for this visit.  Subjective Assessment - 03/02/20 0832    Subjective  Pt stated her ankle is feeling good today, no reports of pain currently.    Pertinent History  chronic knee and ankle pain, obesity, depression    Patient Stated Goals  to have less pain    Currently in Pain?  No/denies                        [facility] Adult PT Treatment/Exercise - 03/02/20 0001      Exercises   Exercises  Ankle      Ankle Exercises: Machines for Strengthening   Cybex Leg Press  Power tower 25 degrees, PF 4sets x 5 reps      Ankle Exercises: Standing   Vector Stance  Right;Left;5 reps;5 seconds   intermittent finger A   SLS  Lt 32", Rt 23"    Heel Raises  10 reps;3 seconds   2 sets   Toe Raise  10 reps;3 seconds    Toe Raise Limitations  incline slope    Warrior I  2x 15"    Warrior II  2x 30"    Other Standing Ankle Exercises  tandem gait 1RT, no LOB progress to balance beam  next session    Other Standing Ankle Exercises   tandem stance on foam 2x 30"      Ankle Exercises: Seated   Towel Crunch  1 rep    Towel Crunch Limitations  HEP               PT Short Term Goals - 02/29/20 5885      PT SHORT TERM GOAL #1   Title  Pt to be I in HEP to decrease Lt ankle pain to no greater tahn 5/10    Baseline  02/29/20: Patient reports maximum of 2/10 over the last week    Time  3    Period  Weeks    Status  Achieved    Target Date  02/15/20      PT SHORT TERM GOAL #2   Title  PT to be able to stand/walk for 30 minutes without having to take a rest break    Baseline  02/29/20: Patient reports ability to stand/walk for  30 minutes    Time  3    Period  Weeks    Status  Achieved        PT Long Term Goals - 02/29/20 3244      PT LONG TERM GOAL #1   Title  PT pain to be no greater than a 2/10 to allow her to be able to stand/walk for an hour at a time without having to take breaks.    Baseline  02/29/20: Patient reports maximum pain of 2/10 over the last week, but not able to ambulate for an hour at this time    Time  6    Period  Weeks    Status  On-going      PT LONG TERM GOAL #2   Title  PT ankle strength to be a 5/5 to allow pt to be able to go up and down 8 steps in a reciprocal manner.    Baseline  02/29/20: Achieved in some but not all muscle groups, intermittent reciprocal pattern    Time  6    Period  Weeks    Status  On-going      PT LONG TERM GOAL #3   Title  Pt to be able to single leg stance for 30" on her Rt LE to decrease risk of falls.    Baseline  02/29/20: 23 seconds    Time  6    Period  Weeks    Status  On-going            Plan - 03/02/20 0905    Clinical Impression Statement  Progressed ankle strengthening and stability this session.  Added power tower for gastroc strengthening and warrior poses for stability with min cueing for form wiht new activity.  Pt able to complete whole session with no reports of ankle pain, did state her arch muscles are tired.  Added  intrinsic strengthening exercises to HEP, verbalized understanding and able to demonstrate appropriate.    Personal Factors and Comorbidities  Comorbidity 1    Comorbidities  obesity, depression, chronic knee and ankle pain    Examination-Activity Limitations  Stand;Stairs;Squat;Sit;Locomotion Level;Carry;Transfers    Examination-Participation Restrictions  Cleaning;Shop;Community Activity;Laundry;Meal Prep    Stability/Clinical Decision Making  Stable/Uncomplicated    Clinical Decision Making  Low    Rehab Potential  Fair    PT Frequency  2x / week    PT Duration  3 weeks    PT Treatment/Interventions  ADLs/Self Care Home Management;Aquatic Therapy;Cryotherapy;Electrical Stimulation;Moist Heat;Traction;Balance training;Therapeutic exercise;Therapeutic activities;Functional mobility training;Stair training;Gait training;Orthotic Fit/Training;Ultrasound;Neuromuscular re-education;Patient/family education;Compression bandaging;Passive range of motion;Dry needling    PT Next Visit Plan  Begin balance beam next session.  Focus on RT ankle PF strengthening and higher level balance progress to higher level such as warrior poses, shuffle and grapevine    PT Home Exercise Plan  4/13:  Tband for ankle using blue tband, SLS; 5/20: towel crunch, heel and toe raises       Patient will benefit from skilled therapeutic intervention in order to improve the following deficits and impairments:  Abnormal gait, Pain, Decreased strength, Difficulty walking, Decreased range of motion, Decreased balance, Decreased mobility, Decreased activity tolerance, Increased edema  Visit Diagnosis: Pain in right ankle and joints of right foot  Difficulty in walking, not elsewhere classified  Chronic pain of right knee     Problem List Patient Active Problem List   Diagnosis Date Noted  . MDD (major depressive disorder), recurrent, in full remission ([place]) 11/22/2019  .  Ankle pain, right 07/26/2019  . Recurrent major  depressive disorder, in partial remission (HCC) 11/06/2018  . Left anterior knee pain 11/18/2016  . Vitamin D deficiency 08/05/2016  . Dyspepsia 10/30/2015  . Metabolic syndrome X 03/21/2014  . Dyslipidemia 03/21/2014  . GAD (generalized anxiety disorder) 03/03/2014  . Panic attack 03/03/2014  . Knee pain, right 07/23/2013  . Prediabetes 05/20/2012  . MORBID OBESITY 12/27/2009  . ALLERGIC RHINITIS CAUSE UNSPECIFIED 12/27/2009    Juel Burrow 03/02/2020, 9:18 AM  [facility] [address] [place], [place], [address] Phone: [phone]   Fax:  [phone]  Name: [name] MRN: [id number] Date of Birth: 07/06/1981

## 2020-03-07 ENCOUNTER — Ambulatory Visit (HOSPITAL_COMMUNITY): Payer: BC Managed Care – PPO | Admitting: Physical Therapy

## 2020-03-07 ENCOUNTER — Encounter (HOSPITAL_COMMUNITY): Payer: BC Managed Care – PPO

## 2020-03-07 ENCOUNTER — Encounter (HOSPITAL_COMMUNITY): Payer: Self-pay | Admitting: Physical Therapy

## 2020-03-07 ENCOUNTER — Other Ambulatory Visit: Payer: Self-pay

## 2020-03-07 DIAGNOSIS — R262 Difficulty in walking, not elsewhere classified: Secondary | ICD-10-CM

## 2020-03-07 DIAGNOSIS — M25571 Pain in right ankle and joints of right foot: Secondary | ICD-10-CM | POA: Diagnosis not present

## 2020-03-07 DIAGNOSIS — G8929 Other chronic pain: Secondary | ICD-10-CM

## 2020-03-07 NOTE — Therapy (Signed)
[facility] [address] [place], [place], [address] Phone: [phone]   Fax:  [phone]  Physical Therapy Treatment  Patient Details  Name: [name] MRN: [id number] Date of Birth: Sep 05, 1981 Referring Provider (PT):  Fuller Canada    Encounter Date: 03/07/2020  PT End of Session - 03/07/20 0832    Visit Number  6    Number of Visits  12    Date for PT Re-Evaluation  03/21/20    Authorization Type  BCBS PPO    Authorization - Visit Number  12    Authorization - Number of Visits  30    PT Start Time  0830   Patient arrived late   PT Stop Time  0854    PT Time Calculation (min)  24 min    Activity Tolerance  Patient tolerated treatment well    Behavior During Therapy  [facility] for tasks assessed/performed       Past Medical History:  Diagnosis Date  . Morbid obesity (HCC)     Past Surgical History:  Procedure Laterality Date  . CESAREAN SECTION     x 3  . TUBAL LIGATION      There were no vitals filed for this visit.  Subjective Assessment - 03/07/20 0830    Subjective  Patient reports she is having some pain in her ankle which she rates as a 5/10. She states she felt pretty good following last session.    Pertinent History  chronic knee and ankle pain, obesity, depression    Patient Stated Goals  to have less pain    Currently in Pain?  Yes    Pain Score  5     Pain Location  Ankle    Pain Orientation  Right    Pain Descriptors / Indicators  Aching    Pain Type  Chronic pain                        [facility] Adult PT Treatment/Exercise - 03/07/20 0001      Knee/Hip Exercises: Standing   Heel Raises  10 reps   2 sets   Heel Raises Limitations  toe raises 10x    SLS with Vectors  5X5" each with intermittent 1 HHA    Other Standing Knee Exercises  Tandem ambulation on foam beam 15' x 2 intermittent HHA. Grapevine in // bars x 3 RT for coordination and balance. Trialed once without // bars and  noted unsteadiness so moved to // bars.                PT Short Term Goals - 02/29/20 5093      PT SHORT TERM GOAL #1   Title  Pt to be I in HEP to decrease Lt ankle pain to no greater tahn 5/10    Baseline  02/29/20: Patient reports maximum of 2/10 over the last week    Time  3    Period  Weeks    Status  Achieved    Target Date  02/15/20      PT SHORT TERM GOAL #2   Title  PT to be able to stand/walk for 30 minutes without having to take a rest break    Baseline  02/29/20: Patient reports ability to stand/walk for 30 minutes    Time  3    Period  Weeks    Status  Achieved        PT Long  Term Goals - 02/29/20 0829      PT LONG TERM GOAL #1   Title  PT pain to be no greater than a 2/10 to allow her to be able to stand/walk for an hour at a time without having to take breaks.    Baseline  02/29/20: Patient reports maximum pain of 2/10 over the last week, but not able to ambulate for an hour at this time    Time  6    Period  Weeks    Status  On-going      PT LONG TERM GOAL #2   Title  PT ankle strength to be a 5/5 to allow pt to be able to go up and down 8 steps in a reciprocal manner.    Baseline  02/29/20: Achieved in some but not all muscle groups, intermittent reciprocal pattern    Time  6    Period  Weeks    Status  On-going      PT LONG TERM GOAL #3   Title  Pt to be able to single leg stance for 30" on her Rt LE to decrease risk of falls.    Baseline  02/29/20: 23 seconds    Time  6    Period  Weeks    Status  On-going            Plan - 03/07/20 4944    Clinical Impression Statement  Patient arrived late to session which limited session this date. Continued to focus on strengthening exercises for the ankle. Added grapevine for coordination and balance challenge this session. Noted unsteadiness without UE support so moved to // bars for support and improved confidence with exercise. Added tandem ambulation on foam beam this session with required  intermittent UE assistance.    Personal Factors and Comorbidities  Comorbidity 1    Comorbidities  obesity, depression, chronic knee and ankle pain    Examination-Activity Limitations  Stand;Stairs;Squat;Sit;Locomotion Level;Carry;Transfers    Examination-Participation Restrictions  Cleaning;Shop;Community Activity;Laundry;Meal Prep    Stability/Clinical Decision Making  Stable/Uncomplicated    Rehab Potential  Fair    PT Frequency  2x / week    PT Duration  3 weeks    PT Treatment/Interventions  ADLs/Self Care Home Management;Aquatic Therapy;Cryotherapy;Electrical Stimulation;Moist Heat;Traction;Balance training;Therapeutic exercise;Therapeutic activities;Functional mobility training;Stair training;Gait training;Orthotic Fit/Training;Ultrasound;Neuromuscular re-education;Patient/family education;Compression bandaging;Passive range of motion;Dry needling    PT Next Visit Plan  Focus on RT ankle PF strengthening and higher level balance progress to higher level such as warrior poses, shuffle and grapevine    PT Home Exercise Plan  4/13:  Tband for ankle using blue tband, SLS; 5/20: towel crunch, heel and toe raises       Patient will benefit from skilled therapeutic intervention in order to improve the following deficits and impairments:  Abnormal gait, Pain, Decreased strength, Difficulty walking, Decreased range of motion, Decreased balance, Decreased mobility, Decreased activity tolerance, Increased edema  Visit Diagnosis: Pain in right ankle and joints of right foot  Difficulty in walking, not elsewhere classified  Chronic pain of right knee     Problem List Patient Active Problem List   Diagnosis Date Noted  . MDD (major depressive disorder), recurrent, in full remission (HCC) 11/22/2019  . Ankle pain, right 07/26/2019  . Recurrent major depressive disorder, in partial remission (HCC) 11/06/2018  . Left anterior knee pain 11/18/2016  . Vitamin D deficiency 08/05/2016  .  Dyspepsia 10/30/2015  . Metabolic syndrome X 03/21/2014  . Dyslipidemia 03/21/2014  . GAD (  generalized anxiety disorder) 03/03/2014  . Panic attack 03/03/2014  . Knee pain, right 07/23/2013  . Prediabetes 05/20/2012  . MORBID OBESITY 12/27/2009  . ALLERGIC RHINITIS CAUSE UNSPECIFIED 12/27/2009   Clarene Critchley PT, DPT 8:55 AM, 03/07/20 [place], [place], [address] Phone: [phone]   Fax:  [phone]  Name: [name] MRN: [id number] Date of Birth: 01-31-1981

## 2020-03-09 ENCOUNTER — Encounter (HOSPITAL_COMMUNITY): Payer: BC Managed Care – PPO | Admitting: Physical Therapy

## 2020-03-09 ENCOUNTER — Telehealth (HOSPITAL_COMMUNITY): Payer: Self-pay | Admitting: Physical Therapy

## 2020-03-09 NOTE — Telephone Encounter (Signed)
pt called to cx this appt dyue to she has overslept

## 2020-03-15 ENCOUNTER — Telehealth (HOSPITAL_COMMUNITY): Payer: Self-pay

## 2020-03-15 ENCOUNTER — Telehealth: Payer: Self-pay | Admitting: Orthopedic Surgery

## 2020-03-15 ENCOUNTER — Other Ambulatory Visit: Payer: Self-pay

## 2020-03-15 ENCOUNTER — Encounter: Payer: Self-pay | Admitting: Orthopedic Surgery

## 2020-03-15 ENCOUNTER — Encounter (HOSPITAL_COMMUNITY): Payer: Self-pay

## 2020-03-15 ENCOUNTER — Ambulatory Visit (HOSPITAL_COMMUNITY): Payer: BC Managed Care – PPO | Attending: Orthopedic Surgery

## 2020-03-15 DIAGNOSIS — G8929 Other chronic pain: Secondary | ICD-10-CM | POA: Diagnosis present

## 2020-03-15 DIAGNOSIS — M25571 Pain in right ankle and joints of right foot: Secondary | ICD-10-CM | POA: Insufficient documentation

## 2020-03-15 DIAGNOSIS — M25561 Pain in right knee: Secondary | ICD-10-CM | POA: Diagnosis present

## 2020-03-15 DIAGNOSIS — R262 Difficulty in walking, not elsewhere classified: Secondary | ICD-10-CM | POA: Insufficient documentation

## 2020-03-15 NOTE — Telephone Encounter (Signed)
Called patient regarding [employer] form received in fax. She states it is for intermittent leave from work; aware we will need to proceed through [employer] form process; will come in as soon as she can.

## 2020-03-15 NOTE — Telephone Encounter (Signed)
pt had to cancel appt for 6/4 no reason given

## 2020-03-15 NOTE — Therapy (Signed)
[facility] [address] [place], [place], [address] Phone: [phone]   Fax:  [phone]  Physical Therapy Treatment  Patient Details  Name: [name] MRN: [id number] Date of Birth: 1980/12/01 Referring Provider (PT):  Fuller Canada    Encounter Date: 03/15/2020  PT End of Session - 03/15/20 0941    Visit Number  7    Number of Visits  12    Date for PT Re-Evaluation  03/21/20    Authorization Type  BCBS PPO    Authorization - Visit Number  13    Authorization - Number of Visits  30    PT Start Time  0919    PT Stop Time  0958    PT Time Calculation (min)  39 min    Activity Tolerance  Patient tolerated treatment well;No increased pain    Behavior During Therapy  WFL for tasks assessed/performed       Past Medical History:  Diagnosis Date  . Morbid obesity (HCC)     Past Surgical History:  Procedure Laterality Date  . CESAREAN SECTION     x 3  . TUBAL LIGATION      There were no vitals filed for this visit.  Subjective Assessment - 03/15/20 0922    Subjective  Pt stated she is feelinggood today, pain minimal maybe 2-3/10.  Stated most difficultywith standing for prolonged periods of time, able to stand 30 minutes comfortably    Pertinent History  chronic knee and ankle pain, obesity, depression    How long can you stand comfortably?  30 minutes    Patient Stated Goals  to have less pain    Currently in Pain?  Yes    Pain Score  3     Pain Location  Ankle    Pain Orientation  Right    Pain Descriptors / Indicators  Aching    Pain Type  Chronic pain    Pain Onset  More than a month ago    Pain Frequency  Intermittent    Aggravating Factors   weight bearing    Pain Relieving Factors  medication helps 25%, "walk is out"    Effect of Pain on Daily Activities  limits ADLs; pt needs to rest 4-5 times when she is cleaning her house                        [facility] Adult PT Treatment/Exercise -  03/15/20 0001      Exercises   Exercises  Ankle      Knee/Hip Exercises: Machines for Strengthening   Other Machine  power tower heel raises 22 degrees; 2x 10      Knee/Hip Exercises: Standing   Heel Raises  15 reps   incline slope   Other Standing Knee Exercises  balance beam: tandem and sidestep 2RT, grapevine 3RT slow cadence- encouraged to increased speed as able      Ankle Exercises: Standing   Vector Stance  Right;5 reps;10 seconds    Vector Stance Limitations  on foam    SLS  Lt 40", Rt    Heel Raises  15 reps   2 RT   Heel Raises Limitations  incline slope    Heel Walk (Round Trip)  2RT    Toe Walk (Round Trip)  2RT    Other Standing Ankle Exercises  Balance beam: tandem and sidestep 2RT    Other Standing Ankle Exercises  grapevine 3RT-  slow cadence               PT Short Term Goals - 02/29/20 9528      PT SHORT TERM GOAL #1   Title  Pt to be I in HEP to decrease Lt ankle pain to no greater tahn 5/10    Baseline  02/29/20: Patient reports maximum of 2/10 over the last week    Time  3    Period  Weeks    Status  Achieved    Target Date  02/15/20      PT SHORT TERM GOAL #2   Title  PT to be able to stand/walk for 30 minutes without having to take a rest break    Baseline  02/29/20: Patient reports ability to stand/walk for 30 minutes    Time  3    Period  Weeks    Status  Achieved        PT Long Term Goals - 02/29/20 4132      PT LONG TERM GOAL #1   Title  PT pain to be no greater than a 2/10 to allow her to be able to stand/walk for an hour at a time without having to take breaks.    Baseline  02/29/20: Patient reports maximum pain of 2/10 over the last week, but not able to ambulate for an hour at this time    Time  6    Period  Weeks    Status  On-going      PT LONG TERM GOAL #2   Title  PT ankle strength to be a 5/5 to allow pt to be able to go up and down 8 steps in a reciprocal manner.    Baseline  02/29/20: Achieved in some but not all muscle  groups, intermittent reciprocal pattern    Time  6    Period  Weeks    Status  On-going      PT LONG TERM GOAL #3   Title  Pt to be able to single leg stance for 30" on her Rt LE to decrease risk of falls.    Baseline  02/29/20: 23 seconds    Time  6    Period  Weeks    Status  On-going            Plan - 03/15/20 0944    Clinical Impression Statement  Continued session focus for ankle strengthening exercises.  Pt continues to require HHA on dynamic surfaces due to unsteadiness and LOB.  Added heel/toe walking, pt presents with weak gastroc during this activity with decreased ROM.  Reports pain reduced following therex    Personal Factors and Comorbidities  Comorbidity 1    Comorbidities  obesity, depression, chronic knee and ankle pain    Examination-Activity Limitations  Stand;Stairs;Squat;Sit;Locomotion Level;Carry;Transfers    Examination-Participation Restrictions  Cleaning;Shop;Community Activity;Laundry;Meal Prep    Stability/Clinical Decision Making  Stable/Uncomplicated    Clinical Decision Making  Low    Rehab Potential  Fair    PT Frequency  2x / week    PT Duration  3 weeks    PT Treatment/Interventions  ADLs/Self Care Home Management;Aquatic Therapy;Cryotherapy;Electrical Stimulation;Moist Heat;Traction;Balance training;Therapeutic exercise;Therapeutic activities;Functional mobility training;Stair training;Gait training;Orthotic Fit/Training;Ultrasound;Neuromuscular re-education;Patient/family education;Compression bandaging;Passive range of motion;Dry needling    PT Next Visit Plan  Focus on RT ankle PF strengthening and higher level balance progress to higher level such as warrior poses, shuffle and grapevine    PT Home Exercise Plan  4/13:  Tband  for ankle using blue tband, SLS; 5/20: towel crunch, heel and toe raises       Patient will benefit from skilled therapeutic intervention in order to improve the following deficits and impairments:  Abnormal gait, Pain,  Decreased strength, Difficulty walking, Decreased range of motion, Decreased balance, Decreased mobility, Decreased activity tolerance, Increased edema  Visit Diagnosis: Difficulty in walking, not elsewhere classified  Pain in right ankle and joints of right foot     Problem List Patient Active Problem List   Diagnosis Date Noted  . MDD (major depressive disorder), recurrent, in full remission (HCC) 11/22/2019  . Ankle pain, right 07/26/2019  . Recurrent major depressive disorder, in partial remission (HCC) 11/06/2018  . Left anterior knee pain 11/18/2016  . Vitamin D deficiency 08/05/2016  . Dyspepsia 10/30/2015  . Metabolic syndrome X 03/21/2014  . Dyslipidemia 03/21/2014  . GAD (generalized anxiety disorder) 03/03/2014  . Panic attack 03/03/2014  . Knee pain, right 07/23/2013  . Prediabetes 05/20/2012  . MORBID OBESITY 12/27/2009  . ALLERGIC RHINITIS CAUSE UNSPECIFIED 12/27/2009   Becky Sax, LPTA/CLT; CBIS [phone]  Juel Burrow 03/15/2020, 9:59 AM  [facility] [address] [place], [place], [address] Phone: [phone]   Fax:  [phone]  Name: [name] MRN: [id number] Date of Birth: Feb 02, 1981

## 2020-03-17 ENCOUNTER — Encounter (HOSPITAL_COMMUNITY): Payer: BC Managed Care – PPO

## 2020-03-20 ENCOUNTER — Ambulatory Visit: Payer: BC Managed Care – PPO | Admitting: Family Medicine

## 2020-03-21 ENCOUNTER — Encounter (HOSPITAL_COMMUNITY): Payer: Self-pay | Admitting: Physical Therapy

## 2020-03-21 ENCOUNTER — Other Ambulatory Visit: Payer: Self-pay

## 2020-03-21 ENCOUNTER — Ambulatory Visit (HOSPITAL_COMMUNITY): Payer: BC Managed Care – PPO | Admitting: Physical Therapy

## 2020-03-21 DIAGNOSIS — M25571 Pain in right ankle and joints of right foot: Secondary | ICD-10-CM

## 2020-03-21 DIAGNOSIS — R262 Difficulty in walking, not elsewhere classified: Secondary | ICD-10-CM | POA: Diagnosis not present

## 2020-03-21 NOTE — Therapy (Signed)
[place], [place], [address] Phone: [phone]   Fax:  [phone]  Physical Therapy Treatment / Progress Note  Patient Details  Name: [name] MRN: [id number] Date of Birth: 04-06-1981 Referring Provider (PT):  Arther Abbott    Encounter Date: 03/21/2020   Progress Note Reporting Period 02/29/20 to 03/21/20  See note below for Objective Data and Assessment of Progress/Goals.       PT End of Session - 03/21/20 0839    Visit Number  8    Number of Visits  12    Date for PT Re-Evaluation  04/11/20    Authorization Type  [employer] PPO    Authorization - Visit Number  14    Authorization - Number of Visits  30    PT Start Time  [phone]   patient arrived late   PT Stop Time  9024    PT Time Calculation (min)  23 min    Activity Tolerance  Patient tolerated treatment well;No increased pain    Behavior During Therapy  WFL for tasks assessed/performed       Past Medical History:  Diagnosis Date  . Morbid obesity ([place])     Past Surgical History:  Procedure Laterality Date  . CESAREAN SECTION     x 3  . TUBAL LIGATION      There were no vitals filed for this visit.  Subjective Assessment - 03/21/20 0835    Subjective  Patient reported that currently she is having ankle pain which she rates as a 6 or 7/10. Patient reported that she was standing on her feet all night on Friday and that's why it is bothering her.    Pertinent History  chronic knee and ankle pain, obesity, depression    How long can you stand comfortably?  30 minutes maximum    How long can you walk comfortably?  30 minutes maximum    Patient Stated Goals  to have less pain    Currently in Pain?  Yes    Pain Score  7     Pain Location  Ankle    Pain Orientation  Right;Medial    Pain Descriptors / Indicators  Aching;Sharp    Pain Type  Chronic pain    Pain Onset  More than a month ago    Pain Frequency  Intermittent          [facility] PT Assessment - 03/21/20 0001      Assessment   Medical Diagnosis  posterior tibial tendon dysfunction    Referring Provider (PT)   Arther Abbott       Precautions   Precautions  None      Restrictions   Weight Bearing Restrictions  No      Prior Function   Level of Independence  Independent    Vocation  Full time employment      Cognition   Overall Cognitive Status  Within Functional Limits for tasks assessed      Observation/Other Assessments   Focus on Therapeutic Outcomes (FOTO)   49%      Strength   Right Hip Flexion  5/5   was 4+   Left Hip Flexion  5/5   was 4+   Right Knee Extension  5/5    Left Knee Extension  5/5    Right Ankle Dorsiflexion  5/5    Right Ankle Plantar Flexion  4-/5   was 3-   Right Ankle Inversion  5/5    Right Ankle Eversion  5/5    Left Ankle Dorsiflexion  5/5    Left Ankle Plantar Flexion  4/5   was 3-   Left Ankle Inversion  5/5    Left Ankle Eversion  5/5      Palpation   Palpation comment  Tenderness to palpation of right medial calf      Special Tests    Special Tests  --   Anterior drawer of ankle negative bil     Ambulation/Gait   Gait Comments  Ascending and descending 8 7'' stairs with slow ascending resting on each step and intermittent descending stairs with reciprocal pattern      Static Standing Balance   Static Standing - Comment/# of Minutes  SLS RT LE: 45''                               PT Short Term Goals - 03/21/20 0840      PT SHORT TERM GOAL #1   Title  Pt to be I in HEP to decrease RT ankle pain to no greater tahn 5/10    Baseline  03/21/20: HEP is going well, but reporting a 7/10 pain currently    Time  1    Period  Weeks    Status  Partially Met    Target Date  03/28/20      PT SHORT TERM GOAL #2   Title  PT to be able to stand/walk for 30 minutes without having to take a rest break    Baseline  02/29/20: Patient reports ability to stand/walk for 30 minutes     Time  3    Period  Weeks    Status  Achieved        PT Long Term Goals - 03/21/20 4270      PT LONG TERM GOAL #1   Title  PT pain to be no greater than a 2/10 to allow her to be able to stand/walk for an hour at a time without having to take breaks.    Baseline  03/21/20: Patient reported a maximum of 30 minutes ability to walk at this time, unable to ambulate for an hour    Time  6    Period  Weeks    Status  On-going      PT LONG TERM GOAL #2   Title  PT ankle strength to be a 5/5 to allow pt to be able to go up and down 8 steps in a reciprocal manner.    Baseline  03/21/20: Achieved in some but not all muscle groups, intermittent reciprocal pattern    Time  3    Period  Weeks    Status  On-going    Target Date  04/11/20      PT LONG TERM GOAL #3   Title  Pt to be able to single leg stance for 30" on her Rt LE to decrease risk of falls.    Baseline  02/29/20: 23 seconds    Time  6    Period  Weeks    Status  Achieved            Plan - 03/21/20 0908    Clinical Impression Statement  Performed a re-assessment of patient's progress towards goals. Patient with increased pain this session which she attributed to standing at her daughter's graduation for more than 30 minutes. Patient has made good  progress with balance and has demonstrated improvements in LE strength. However, patient continues to demonstrate some deficits in plantarflexion strength as well as reporting difficulty when standing for an hour and with functional strength. Patient would benefit from continued skilled physical therapy in order to continue addressing the abovementioned deficits and helping patient return to her PLOF.    Personal Factors and Comorbidities  Comorbidity 1    Comorbidities  obesity, depression, chronic knee and ankle pain    Examination-Activity Limitations  Stand;Stairs;Squat;Sit;Locomotion Level;Carry;Transfers    Examination-Participation Restrictions  Cleaning;Shop;Community  Activity;Laundry;Meal Prep    Stability/Clinical Decision Making  Stable/Uncomplicated    Rehab Potential  Fair    PT Frequency  2x / week    PT Duration  3 weeks    PT Treatment/Interventions  ADLs/Self Care Home Management;Aquatic Therapy;Cryotherapy;Electrical Stimulation;Moist Heat;Traction;Balance training;Therapeutic exercise;Therapeutic activities;Functional mobility training;Stair training;Gait training;Orthotic Fit/Training;Ultrasound;Neuromuscular re-education;Patient/family education;Compression bandaging;Passive range of motion;Dry needling    PT Next Visit Plan  Focus on RT ankle PF strengthening, STM to right posterior tib and calf for pain reduction and functional strengthening particularly eccentric    PT Home Exercise Plan  4/13:  Tband for ankle using blue tband, SLS; 5/20: towel crunch, heel and toe raises    Consulted and Agree with Plan of Care  Patient       Patient will benefit from skilled therapeutic intervention in order to improve the following deficits and impairments:  Abnormal gait, Pain, Decreased strength, Difficulty walking, Decreased range of motion, Decreased balance, Decreased mobility, Decreased activity tolerance, Increased edema  Visit Diagnosis: Difficulty in walking, not elsewhere classified  Pain in right ankle and joints of right foot     Problem List Patient Active Problem List   Diagnosis Date Noted  . MDD (major depressive disorder), recurrent, in full remission ([place]) 11/22/2019  . Ankle pain, right 07/26/2019  . Recurrent major depressive disorder, in partial remission ([place]) 11/06/2018  . Left anterior knee pain 11/18/2016  . Vitamin D deficiency 08/05/2016  . Dyspepsia 10/30/2015  . Metabolic syndrome X 35/00/9381  . Dyslipidemia 03/21/2014  . GAD (generalized anxiety disorder) 03/03/2014  . Panic attack 03/03/2014  . Knee pain, right 07/23/2013  . Prediabetes 05/20/2012  . MORBID OBESITY 12/27/2009  . ALLERGIC RHINITIS CAUSE  UNSPECIFIED 12/27/2009   Clarene Critchley PT, DPT 9:10 AM, 03/21/20 [place], [place], [address] Phone: [phone]   Fax:  [phone]  Name: [name] MRN: [id number] Date of Birth: August 02, 1981

## 2020-03-23 ENCOUNTER — Encounter (HOSPITAL_COMMUNITY): Payer: Self-pay | Admitting: Physical Therapy

## 2020-03-23 ENCOUNTER — Ambulatory Visit (HOSPITAL_COMMUNITY): Payer: BC Managed Care – PPO | Admitting: Physical Therapy

## 2020-03-23 ENCOUNTER — Other Ambulatory Visit: Payer: Self-pay

## 2020-03-23 DIAGNOSIS — R262 Difficulty in walking, not elsewhere classified: Secondary | ICD-10-CM

## 2020-03-23 DIAGNOSIS — M25571 Pain in right ankle and joints of right foot: Secondary | ICD-10-CM

## 2020-03-23 DIAGNOSIS — G8929 Other chronic pain: Secondary | ICD-10-CM

## 2020-03-23 NOTE — Therapy (Signed)
[place], [place], [address] Phone: [phone]   Fax:  [phone]  Physical Therapy Treatment  Patient Details  Name: [name] MRN: [id number] Date of Birth: 21-Jun-1981 Referring Provider (PT):  Arther Abbott    Encounter Date: 03/23/2020   PT End of Session - 03/23/20 0918    Visit Number 9    Number of Visits 12    Date for PT Re-Evaluation 04/11/20    Authorization Type BCBS PPO    Authorization - Visit Number 15    Authorization - Number of Visits 30    PT Start Time 7680   PT arrived late   PT Stop Time 0940    PT Time Calculation (min) 25 min    Activity Tolerance Patient tolerated treatment well;No increased pain    Behavior During Therapy WFL for tasks assessed/performed           Past Medical History:  Diagnosis Date  . Morbid obesity ([place])     Past Surgical History:  Procedure Laterality Date  . CESAREAN SECTION     x 3  . TUBAL LIGATION      There were no vitals filed for this visit.   Subjective Assessment - 03/23/20 0918    Subjective Patient reported feeling better today. She stated that her pain is currently a 1/10.    Pertinent History chronic knee and ankle pain, obesity, depression    How long can you stand comfortably? 30 minutes maximum    How long can you walk comfortably? 30 minutes maximum    Patient Stated Goals to have less pain    Currently in Pain? Yes    Pain Score 1     Pain Location Ankle    Pain Orientation Right;Medial    Pain Descriptors / Indicators Aching    Pain Type Chronic pain    Pain Onset More than a month ago                             [facility] Adult PT Treatment/Exercise - 03/23/20 0001      Knee/Hip Exercises: Stretches   Gastroc Stretch Limitations;3 reps;30 seconds    Gastroc Stretch Limitations slant board      Knee/Hip Exercises: Standing   Heel Raises 2 sets;10 reps    Heel Raises Limitations 2'' step      Forward Lunges Right;Left;1 set;10 reps    Forward Lunges Limitations 10'' holds without UE support working stability    Other Standing Knee Exercises Heel tap on 4'' step with bil HHA x10 each     Other Standing Knee Exercises Heel raise with tennis ball in midfoot x10                    PT Short Term Goals - 03/21/20 0840      PT SHORT TERM GOAL #1   Title Pt to be I in HEP to decrease RT ankle pain to no greater tahn 5/10    Baseline 03/21/20: HEP is going well, but reporting a 7/10 pain currently    Time 1    Period Weeks    Status Partially Met    Target Date 03/28/20      PT SHORT TERM GOAL #2   Title PT to be able to stand/walk for 30 minutes without having to take a rest break    Baseline 02/29/20: Patient reports ability  to stand/walk for 30 minutes    Time 3    Period Weeks    Status Achieved             PT Long Term Goals - 03/21/20 0841      PT LONG TERM GOAL #1   Title PT pain to be no greater than a 2/10 to allow her to be able to stand/walk for an hour at a time without having to take breaks.    Baseline 03/21/20: Patient reported a maximum of 30 minutes ability to walk at this time, unable to ambulate for an hour    Time 6    Period Weeks    Status On-going      PT LONG TERM GOAL #2   Title PT ankle strength to be a 5/5 to allow pt to be able to go up and down 8 steps in a reciprocal manner.    Baseline 03/21/20: Achieved in some but not all muscle groups, intermittent reciprocal pattern    Time 3    Period Weeks    Status On-going    Target Date 04/11/20      PT LONG TERM GOAL #3   Title Pt to be able to single leg stance for 30" on her Rt LE to decrease risk of falls.    Baseline 02/29/20: 23 seconds    Time 6    Period Weeks    Status Achieved                 Plan - 03/23/20 4174    Clinical Impression Statement Patient arrived late this session which limited session. Focused on strength and stability this session. Added heel  raises on 2'' step this session for eccentric strengthening. Also added heel taps on 4'' step to improve eccentric control. Used a Geologist, engineering for visual cueing with this and patient required some verbal cues for proper form. Patient would benefit from continued skilled therapy to continue progressing towards functional goals.    Personal Factors and Comorbidities Comorbidity 1    Comorbidities obesity, depression, chronic knee and ankle pain    Examination-Activity Limitations Stand;Stairs;Squat;Sit;Locomotion Level;Carry;Transfers    Examination-Participation Restrictions Cleaning;Shop;Community Activity;Laundry;Meal Prep    Stability/Clinical Decision Making Stable/Uncomplicated    Rehab Potential Fair    PT Frequency 2x / week    PT Duration 3 weeks    PT Treatment/Interventions ADLs/Self Care Home Management;Aquatic Therapy;Cryotherapy;Electrical Stimulation;Moist Heat;Traction;Balance training;Therapeutic exercise;Therapeutic activities;Functional mobility training;Stair training;Gait training;Orthotic Fit/Training;Ultrasound;Neuromuscular re-education;Patient/family education;Compression bandaging;Passive range of motion;Dry needling    PT Next Visit Plan Focus on RT ankle PF strengthening, STM to right posterior tib and calf for pain reduction and functional strengthening particularly eccentric    PT Home Exercise Plan 4/13:  Tband for ankle using blue tband, SLS; 5/20: towel crunch, heel and toe raises    Consulted and Agree with Plan of Care Patient           Patient will benefit from skilled therapeutic intervention in order to improve the following deficits and impairments:  Abnormal gait, Pain, Decreased strength, Difficulty walking, Decreased range of motion, Decreased balance, Decreased mobility, Decreased activity tolerance, Increased edema  Visit Diagnosis: Difficulty in walking, not elsewhere classified  Pain in right ankle and joints of right foot  Chronic pain of right  knee     Problem List Patient Active Problem List   Diagnosis Date Noted  . MDD (major depressive disorder), recurrent, in full remission ([place]) 11/22/2019  . Ankle pain, right 07/26/2019  .  Recurrent major depressive disorder, in partial remission ([place]) 11/06/2018  . Left anterior knee pain 11/18/2016  . Vitamin D deficiency 08/05/2016  . Dyspepsia 10/30/2015  . Metabolic syndrome X 04/08/9484  . Dyslipidemia 03/21/2014  . GAD (generalized anxiety disorder) 03/03/2014  . Panic attack 03/03/2014  . Knee pain, right 07/23/2013  . Prediabetes 05/20/2012  . MORBID OBESITY 12/27/2009  . ALLERGIC RHINITIS CAUSE UNSPECIFIED 12/27/2009   Clarene Critchley PT, DPT 9:40 AM, 03/23/20 [place], [place], [address] Phone: [phone]   Fax:  [phone]  Name: EVETTE DICLEMENTE MRN: [id number] Date of Birth: 1981/07/23

## 2020-03-28 ENCOUNTER — Other Ambulatory Visit: Payer: Self-pay

## 2020-03-28 ENCOUNTER — Ambulatory Visit (HOSPITAL_COMMUNITY): Payer: BC Managed Care – PPO

## 2020-03-28 ENCOUNTER — Encounter (HOSPITAL_COMMUNITY): Payer: Self-pay

## 2020-03-28 DIAGNOSIS — R262 Difficulty in walking, not elsewhere classified: Secondary | ICD-10-CM

## 2020-03-28 DIAGNOSIS — M25571 Pain in right ankle and joints of right foot: Secondary | ICD-10-CM

## 2020-03-28 NOTE — Therapy (Signed)
[place], [place], [address] Phone: [phone]   Fax:  [phone]  Physical Therapy Treatment  Patient Details  Name: [name] MRN: [id number] Date of Birth: Jun 29, 1981 Referring Provider (PT):  Arther Abbott    Encounter Date: 03/28/2020   PT End of Session - 03/28/20 0934    Visit Number 10    Number of Visits 12    Date for PT Re-Evaluation 04/11/20    Authorization Type BCBS PPO    Authorization - Visit Number 16    Authorization - Number of Visits 30    PT Start Time [phone]   pt late for apt   PT Stop Time 1000    PT Time Calculation (min) 31 min    Activity Tolerance Patient tolerated treatment well;No increased pain    Behavior During Therapy WFL for tasks assessed/performed           Past Medical History:  Diagnosis Date  . Morbid obesity ([place])     Past Surgical History:  Procedure Laterality Date  . CESAREAN SECTION     x 3  . TUBAL LIGATION      There were no vitals filed for this visit.   Subjective Assessment - 03/28/20 0930    Subjective Pt stated she is feeling good today, pain is minimal 1-2/10 sore achey pain.    Pertinent History chronic knee and ankle pain, obesity, depression    Patient Stated Goals to have less pain    Currently in Pain? Yes    Pain Score 2     Pain Location Ankle    Pain Orientation Right    Pain Descriptors / Indicators Aching;Sore    Pain Type Chronic pain    Pain Onset More than a month ago    Pain Frequency Intermittent    Aggravating Factors  weight bearing    Pain Relieving Factors medication helps 25%, "walk it out"    Effect of Pain on Daily Activities limits ADLs, only needs to rest following 45 minutes cleaning her house.                             [facility] Adult PT Treatment/Exercise - 03/28/20 0001      Knee/Hip Exercises: Machines for Strengthening   Cybex Leg Press PF 20# 10x    Other Machine power tower  heel raises 22 degrees; 2x 10  Bil Up and Rt down slow      Knee/Hip Exercises: Standing   Heel Raises 2 sets;10 reps    Heel Raises Limitations 2'' step    Forward Lunges Right;Left;1 set;10 reps    Forward Lunges Limitations 10'' holds without UE support working stability    Step Down Right;2 sets;10 reps;Step Height: 4"    Step Down Limitations eccentric control    Other Standing Knee Exercises Heel raise with tennis ball in midfoot x10 2 sets 5" holds                    PT Short Term Goals - 03/21/20 0840      PT SHORT TERM GOAL #1   Title Pt to be I in HEP to decrease RT ankle pain to no greater tahn 5/10    Baseline 03/21/20: HEP is going well, but reporting a 7/10 pain currently    Time 1    Period Weeks    Status Partially Met  Target Date 03/28/20      PT SHORT TERM GOAL #2   Title PT to be able to stand/walk for 30 minutes without having to take a rest break    Baseline 02/29/20: Patient reports ability to stand/walk for 30 minutes    Time 3    Period Weeks    Status Achieved             PT Long Term Goals - 03/21/20 9381      PT LONG TERM GOAL #1   Title PT pain to be no greater than a 2/10 to allow her to be able to stand/walk for an hour at a time without having to take breaks.    Baseline 03/21/20: Patient reported a maximum of 30 minutes ability to walk at this time, unable to ambulate for an hour    Time 6    Period Weeks    Status On-going      PT LONG TERM GOAL #2   Title PT ankle strength to be a 5/5 to allow pt to be able to go up and down 8 steps in a reciprocal manner.    Baseline 03/21/20: Achieved in some but not all muscle groups, intermittent reciprocal pattern    Time 3    Period Weeks    Status On-going    Target Date 04/11/20      PT LONG TERM GOAL #3   Title Pt to be able to single leg stance for 30" on her Rt LE to decrease risk of falls.    Baseline 02/29/20: 23 seconds    Time 6    Period Weeks    Status Achieved                   Plan - 03/28/20 8299    Clinical Impression Statement Session focus with eccentric gastroc strengthening.  Attempted Bil heel raise wiht Rt lowering on power tower, pt continues to demonstrate weakness noted wiht decreased range and visible musculature fatigue.  No reports of increased pain, pt stated her arch pain resolved following step down exercise.    Personal Factors and Comorbidities Comorbidity 1    Comorbidities obesity, depression, chronic knee and ankle pain    Examination-Activity Limitations Stand;Stairs;Squat;Sit;Locomotion Level;Carry;Transfers    Examination-Participation Restrictions Cleaning;Shop;Community Activity;Laundry;Meal Prep    Stability/Clinical Decision Making Stable/Uncomplicated    Clinical Decision Making Low    Rehab Potential Fair    PT Frequency 2x / week    PT Duration 3 weeks    PT Treatment/Interventions ADLs/Self Care Home Management;Aquatic Therapy;Cryotherapy;Electrical Stimulation;Moist Heat;Traction;Balance training;Therapeutic exercise;Therapeutic activities;Functional mobility training;Stair training;Gait training;Orthotic Fit/Training;Ultrasound;Neuromuscular re-education;Patient/family education;Compression bandaging;Passive range of motion;Dry needling    PT Next Visit Plan Focus on RT ankle PF strengthening, STM to right posterior tib and calf for pain reduction and functional strengthening particularly eccentric    PT Home Exercise Plan 4/13:  Tband for ankle using blue tband, SLS; 5/20: towel crunch, heel and toe raises           Patient will benefit from skilled therapeutic intervention in order to improve the following deficits and impairments:  Abnormal gait, Pain, Decreased strength, Difficulty walking, Decreased range of motion, Decreased balance, Decreased mobility, Decreased activity tolerance, Increased edema  Visit Diagnosis: Pain in right ankle and joints of right foot  Difficulty in walking, not elsewhere  classified     Problem List Patient Active Problem List   Diagnosis Date Noted  . MDD (major depressive disorder), recurrent, in full  remission ([place]) 11/22/2019  . Ankle pain, right 07/26/2019  . Recurrent major depressive disorder, in partial remission ([place]) 11/06/2018  . Left anterior knee pain 11/18/2016  . Vitamin D deficiency 08/05/2016  . Dyspepsia 10/30/2015  . Metabolic syndrome X 05/39/7673  . Dyslipidemia 03/21/2014  . GAD (generalized anxiety disorder) 03/03/2014  . Panic attack 03/03/2014  . Knee pain, right 07/23/2013  . Prediabetes 05/20/2012  . MORBID OBESITY 12/27/2009  . ALLERGIC RHINITIS CAUSE UNSPECIFIED 12/27/2009   Ihor Austin, LPTA/CLT; CBIS [phone]  Aldona Lento 03/28/2020, 4:36 PM  [facility] [address] [place], [place], [address] Phone: [phone]   Fax:  [phone]  Name: MANDISA PERSINGER MRN: [id number] Date of Birth: 12/04/1980

## 2020-03-31 ENCOUNTER — Encounter (HOSPITAL_COMMUNITY): Payer: Self-pay | Admitting: Physical Therapy

## 2020-03-31 ENCOUNTER — Ambulatory Visit (HOSPITAL_COMMUNITY): Payer: BC Managed Care – PPO | Admitting: Physical Therapy

## 2020-03-31 ENCOUNTER — Other Ambulatory Visit: Payer: Self-pay

## 2020-03-31 DIAGNOSIS — M25561 Pain in right knee: Secondary | ICD-10-CM

## 2020-03-31 DIAGNOSIS — R262 Difficulty in walking, not elsewhere classified: Secondary | ICD-10-CM | POA: Diagnosis not present

## 2020-03-31 DIAGNOSIS — M25571 Pain in right ankle and joints of right foot: Secondary | ICD-10-CM

## 2020-03-31 NOTE — Therapy (Signed)
[place], [place], [address] Phone: [phone]   Fax:  [phone]  Physical Therapy Treatment  Patient Details  Name: [name] MRN: [id number] Date of Birth: Dec 30, 1980 Referring Provider (PT):  Arther Abbott    Encounter Date: 03/31/2020   PT End of Session - 03/31/20 1003    Visit Number 11    Number of Visits 18   Updated for new appointments   Date for PT Re-Evaluation 04/11/20    Authorization Type [employer]BS PPO    Authorization - Visit Number 17    Authorization - Number of Visits 30    PT Start Time 1000   PT arrived late   PT Stop Time 1028    PT Time Calculation (min) 28 min    Activity Tolerance Patient tolerated treatment well;No increased pain    Behavior During Therapy WFL for tasks assessed/performed           Past Medical History:  Diagnosis Date  . Morbid obesity ([place])     Past Surgical History:  Procedure Laterality Date  . CESAREAN SECTION     x 3  . TUBAL LIGATION      There were no vitals filed for this visit.   Subjective Assessment - 03/31/20 1002    Subjective Patient denied any pain currently. Stated she is feeling good.    Pertinent History chronic knee and ankle pain, obesity, depression    Patient Stated Goals to have less pain    Currently in Pain? No/denies                             [facility] Adult PT Treatment/Exercise - 03/31/20 0001      Knee/Hip Exercises: Standing   Heel Raises 2 sets;10 reps    Heel Raises Limitations 2'' step    Forward Lunges Right;Left;1 set;10 reps    Forward Lunges Limitations 10'' holds without UE support working stability    Other Standing Knee Exercises Heel raise with tennis ball in midfoot x10 2 sets 5" holds      Manual Therapy   Manual Therapy Soft tissue mobilization    Manual therapy comments All manual completed separately from other skilled interventions    Soft tissue mobilization To RT calf,  posterior tibialis to decrease muscular restrictions                    PT Short Term Goals - 03/21/20 0840      PT SHORT TERM GOAL #1   Title Pt to be I in HEP to decrease RT ankle pain to no greater tahn 5/10    Baseline 03/21/20: HEP is going well, but reporting a 7/10 pain currently    Time 1    Period Weeks    Status Partially Met    Target Date 03/28/20      PT SHORT TERM GOAL #2   Title PT to be able to stand/walk for 30 minutes without having to take a rest break    Baseline 02/29/20: Patient reports ability to stand/walk for 30 minutes    Time 3    Period Weeks    Status Achieved             PT Long Term Goals - 03/21/20 1287      PT LONG TERM GOAL #1   Title PT pain to be no greater than a 2/10 to  allow her to be able to stand/walk for an hour at a time without having to take breaks.    Baseline 03/21/20: Patient reported a maximum of 30 minutes ability to walk at this time, unable to ambulate for an hour    Time 6    Period Weeks    Status On-going      PT LONG TERM GOAL #2   Title PT ankle strength to be a 5/5 to allow pt to be able to go up and down 8 steps in a reciprocal manner.    Baseline 03/21/20: Achieved in some but not all muscle groups, intermittent reciprocal pattern    Time 3    Period Weeks    Status On-going    Target Date 04/11/20      PT LONG TERM GOAL #3   Title Pt to be able to single leg stance for 30" on her Rt LE to decrease risk of falls.    Baseline 02/29/20: 23 seconds    Time 6    Period Weeks    Status Achieved                 Plan - 03/31/20 1040    Clinical Impression Statement Patient did arrive late for session this date which did limit session. Focused on plantarflexion strengthening and stability this session. This session performed soft tissue mobilization to decrease muscular restrictions which patient tolerated well without any increase in pain. Most muscular restrictions noted through the medial side of the  right calf which is where manual was focused most. Plan to continue focus on plantarflexion strengthening, stability, and decreasing muscular restrictions as needed.    Personal Factors and Comorbidities Comorbidity 1    Comorbidities obesity, depression, chronic knee and ankle pain    Examination-Activity Limitations Stand;Stairs;Squat;Sit;Locomotion Level;Carry;Transfers    Examination-Participation Restrictions Cleaning;Shop;Community Activity;Laundry;Meal Prep    Stability/Clinical Decision Making Stable/Uncomplicated    Rehab Potential Fair    PT Frequency 2x / week    PT Duration 3 weeks    PT Treatment/Interventions ADLs/Self Care Home Management;Aquatic Therapy;Cryotherapy;Electrical Stimulation;Moist Heat;Traction;Balance training;Therapeutic exercise;Therapeutic activities;Functional mobility training;Stair training;Gait training;Orthotic Fit/Training;Ultrasound;Neuromuscular re-education;Patient/family education;Compression bandaging;Passive range of motion;Dry needling    PT Next Visit Plan Focus on RT ankle PF strengthening, STM to right posterior tib and calf for pain reduction and functional strengthening particularly eccentric    PT Home Exercise Plan 4/13:  Tband for ankle using blue tband, SLS; 5/20: towel crunch, heel and toe raises           Patient will benefit from skilled therapeutic intervention in order to improve the following deficits and impairments:  Abnormal gait, Pain, Decreased strength, Difficulty walking, Decreased range of motion, Decreased balance, Decreased mobility, Decreased activity tolerance, Increased edema  Visit Diagnosis: Pain in right ankle and joints of right foot  Difficulty in walking, not elsewhere classified  Chronic pain of right knee     Problem List Patient Active Problem List   Diagnosis Date Noted  . MDD (major depressive disorder), recurrent, in full remission ([place]) 11/22/2019  . Ankle pain, right 07/26/2019  . Recurrent major  depressive disorder, in partial remission ([place]) 11/06/2018  . Left anterior knee pain 11/18/2016  . Vitamin D deficiency 08/05/2016  . Dyspepsia 10/30/2015  . Metabolic syndrome X 34/19/3790  . Dyslipidemia 03/21/2014  . GAD (generalized anxiety disorder) 03/03/2014  . Panic attack 03/03/2014  . Knee pain, right 07/23/2013  . Prediabetes 05/20/2012  . MORBID OBESITY 12/27/2009  . ALLERGIC RHINITIS  CAUSE UNSPECIFIED 12/27/2009   Clarene Critchley PT, DPT 10:43 AM, 03/31/20 [place], [place], [address] Phone: [phone]   Fax:  [phone]  Name: [name] MRN: [id number] Date of Birth: 1981-06-14

## 2020-04-04 ENCOUNTER — Other Ambulatory Visit: Payer: Self-pay | Admitting: Family Medicine

## 2020-04-04 ENCOUNTER — Telehealth (HOSPITAL_COMMUNITY): Payer: Self-pay | Admitting: Physical Therapy

## 2020-04-04 ENCOUNTER — Ambulatory Visit (HOSPITAL_COMMUNITY): Payer: BC Managed Care – PPO | Admitting: Physical Therapy

## 2020-04-04 NOTE — Telephone Encounter (Signed)
PT CALLED TO [facility]'S APPT DUE TO SHE HAS OVERSLEPT

## 2020-04-06 ENCOUNTER — Ambulatory Visit: Payer: BC Managed Care – PPO | Admitting: Family Medicine

## 2020-04-07 ENCOUNTER — Encounter (HOSPITAL_COMMUNITY): Payer: Self-pay

## 2020-04-07 ENCOUNTER — Other Ambulatory Visit: Payer: Self-pay

## 2020-04-07 ENCOUNTER — Ambulatory Visit (HOSPITAL_COMMUNITY): Payer: BC Managed Care – PPO

## 2020-04-07 DIAGNOSIS — G8929 Other chronic pain: Secondary | ICD-10-CM

## 2020-04-07 DIAGNOSIS — R262 Difficulty in walking, not elsewhere classified: Secondary | ICD-10-CM

## 2020-04-07 DIAGNOSIS — M25571 Pain in right ankle and joints of right foot: Secondary | ICD-10-CM

## 2020-04-07 NOTE — Therapy (Signed)
[place], [place], [address] Phone: [phone]   Fax:  [phone]  Physical Therapy Treatment  Patient Details  Name: [name] MRN: [id number] Date of Birth: Jun 01, 1981 Referring Provider (PT):  Arther Abbott    Encounter Date: 04/07/2020   PT End of Session - 04/07/20 1013    Visit Number 12    Number of Visits 18    Date for PT Re-Evaluation 04/11/20    Authorization Type BCBS PPO    Authorization - Visit Number 18    Authorization - Number of Visits 30    PT Start Time 4944   late arrival   PT Stop Time 1046    PT Time Calculation (min) 38 min    Activity Tolerance Patient tolerated treatment well;No increased pain    Behavior During Therapy WFL for tasks assessed/performed           Past Medical History:  Diagnosis Date  . Morbid obesity ([place])     Past Surgical History:  Procedure Laterality Date  . CESAREAN SECTION     x 3  . TUBAL LIGATION      There were no vitals filed for this visit.   Subjective Assessment - 04/07/20 1011    Subjective Pt stated increased pain today.  Reports they are working on parking lot with work and having to walk more.    Pertinent History chronic knee and ankle pain, obesity, depression    Patient Stated Goals to have less pain    Currently in Pain? Yes    Pain Score 4     Pain Location Ankle    Pain Orientation Right    Pain Descriptors / Indicators Aching;Sore;Dull    Pain Type Chronic pain    Pain Onset More than a month ago    Aggravating Factors  weight bearing    Pain Relieving Factors medication helps 25%, "walk it out", ice and elevation    Effect of Pain on Daily Activities limits ADLs, only needs to rest following 45 minutes cleaning her house                             [facility] Adult PT Treatment/Exercise - 04/07/20 0001      Knee/Hip Exercises: Machines for Strengthening   Other Machine power tower heel raises  22 degrees; 2x 10  Bil Up and Rt down slow      Knee/Hip Exercises: Standing   Heel Raises 2 sets;10 reps    Heel Raises Limitations 2'' step    Forward Lunges Right;Left;1 set;10 reps    Forward Lunges Limitations 10'' holds without UE support working stability    Other Standing Knee Exercises tandem stance on foam 2x 30"; paloff 10x    Other Standing Knee Exercises Heel raise with tennis ball in midfoot x10 2 sets 5" holds      Manual Therapy   Manual Therapy Soft tissue mobilization    Manual therapy comments All manual completed separately from other skilled interventions    Soft tissue mobilization To RT calf, posterior tibialis to decrease muscular restrictions                    PT Short Term Goals - 03/21/20 0840      PT SHORT TERM GOAL #1   Title Pt to be I in HEP to decrease RT ankle pain to no greater tahn 5/10  Baseline 03/21/20: HEP is going well, but reporting a 7/10 pain currently    Time 1    Period Weeks    Status Partially Met    Target Date 03/28/20      PT SHORT TERM GOAL #2   Title PT to be able to stand/walk for 30 minutes without having to take a rest break    Baseline 02/29/20: Patient reports ability to stand/walk for 30 minutes    Time 3    Period Weeks    Status Achieved             PT Long Term Goals - 03/21/20 3159      PT LONG TERM GOAL #1   Title PT pain to be no greater than a 2/10 to allow her to be able to stand/walk for an hour at a time without having to take breaks.    Baseline 03/21/20: Patient reported a maximum of 30 minutes ability to walk at this time, unable to ambulate for an hour    Time 6    Period Weeks    Status On-going      PT LONG TERM GOAL #2   Title PT ankle strength to be a 5/5 to allow pt to be able to go up and down 8 steps in a reciprocal manner.    Baseline 03/21/20: Achieved in some but not all muscle groups, intermittent reciprocal pattern    Time 3    Period Weeks    Status On-going    Target Date  04/11/20      PT LONG TERM GOAL #3   Title Pt to be able to single leg stance for 30" on her Rt LE to decrease risk of falls.    Baseline 02/29/20: 23 seconds    Time 6    Period Weeks    Status Achieved                 Plan - 04/07/20 1110    Clinical Impression Statement Session focus on gastrocnemius strengthening and stability.  Resumed manual STM to address muscular restricitons with noted increased tightness on medial calf and tenderness over posterior tib, reports pain reduced followng therex and STM.    Personal Factors and Comorbidities Comorbidity 1    Comorbidities obesity, depression, chronic knee and ankle pain    Examination-Activity Limitations Stand;Stairs;Squat;Sit;Locomotion Level;Carry;Transfers    Examination-Participation Restrictions Cleaning;Shop;Community Activity;Laundry;Meal Prep    Stability/Clinical Decision Making Stable/Uncomplicated    Clinical Decision Making Low    Rehab Potential Fair    PT Frequency 2x / week    PT Duration 3 weeks    PT Treatment/Interventions ADLs/Self Care Home Management;Aquatic Therapy;Cryotherapy;Electrical Stimulation;Moist Heat;Traction;Balance training;Therapeutic exercise;Therapeutic activities;Functional mobility training;Stair training;Gait training;Orthotic Fit/Training;Ultrasound;Neuromuscular re-education;Patient/family education;Compression bandaging;Passive range of motion;Dry needling    PT Next Visit Plan Review goals next session, end of cert.  Focus on RT ankle PF strengthening, STM to right posterior tib and calf for pain reduction and functional strengthening particularly eccentric    PT Home Exercise Plan 4/13:  Tband for ankle using blue tband, SLS; 5/20: towel crunch, heel and toe raises           Patient will benefit from skilled therapeutic intervention in order to improve the following deficits and impairments:  Abnormal gait, Pain, Decreased strength, Difficulty walking, Decreased range of motion,  Decreased balance, Decreased mobility, Decreased activity tolerance, Increased edema  Visit Diagnosis: Pain in right ankle and joints of right foot  Difficulty in walking, not  elsewhere classified  Chronic pain of right knee     Problem List Patient Active Problem List   Diagnosis Date Noted  . MDD (major depressive disorder), recurrent, in full remission ([place]) 11/22/2019  . Ankle pain, right 07/26/2019  . Recurrent major depressive disorder, in partial remission ([place]) 11/06/2018  . Left anterior knee pain 11/18/2016  . Vitamin D deficiency 08/05/2016  . Dyspepsia 10/30/2015  . Metabolic syndrome X 09/62/8366  . Dyslipidemia 03/21/2014  . GAD (generalized anxiety disorder) 03/03/2014  . Panic attack 03/03/2014  . Knee pain, right 07/23/2013  . Prediabetes 05/20/2012  . MORBID OBESITY 12/27/2009  . ALLERGIC RHINITIS CAUSE UNSPECIFIED 12/27/2009   Ihor Austin, LPTA/CLT; CBIS [phone]  Aldona Lento 04/07/2020, 11:18 AM  [place], [place], [address] Phone: [phone]   Fax:  [phone]  Name: GRACEN RINGWALD MRN: [id number] Date of Birth: 1981-06-11

## 2020-04-11 ENCOUNTER — Encounter (HOSPITAL_COMMUNITY): Payer: Self-pay | Admitting: Physical Therapy

## 2020-04-11 ENCOUNTER — Other Ambulatory Visit: Payer: Self-pay

## 2020-04-11 ENCOUNTER — Ambulatory Visit (HOSPITAL_COMMUNITY): Payer: BC Managed Care – PPO | Admitting: Physical Therapy

## 2020-04-11 DIAGNOSIS — R262 Difficulty in walking, not elsewhere classified: Secondary | ICD-10-CM | POA: Diagnosis not present

## 2020-04-11 DIAGNOSIS — M25571 Pain in right ankle and joints of right foot: Secondary | ICD-10-CM

## 2020-04-11 DIAGNOSIS — G8929 Other chronic pain: Secondary | ICD-10-CM

## 2020-04-11 NOTE — Therapy (Addendum)
[facility] [address] [place], [place], [address] Phone: [phone]   Fax:  [phone]  Physical Therapy Treatment / Discharge Summary  Patient Details  Name: [name] MRN: [id number] Date of Birth: 15-Mar-1981 Referring Provider (PT):  Arther Abbott    Encounter Date: 04/11/2020   PHYSICAL THERAPY DISCHARGE SUMMARY  Visits from Start of Care: 13  Current functional level related to goals / functional outcomes: See below   Remaining deficits: See below   Education / Equipment: HEP, provided information on local gym Plan: Patient agrees to discharge.  Patient goals were partially met. Patient is being discharged due to being pleased with the current functional level.  ?????       PT End of Session - 04/11/20 0830    Visit Number 13    Number of Visits 18    Date for PT Re-Evaluation 04/11/20    Authorization Type [employer] PPO    Authorization - Visit Number 19    Authorization - Number of Visits 30    PT Start Time [phone]   Patient arrived late   PT Stop Time 0843   Discharge   PT Time Calculation (min) 17 min    Activity Tolerance Patient tolerated treatment well;No increased pain    Behavior During Therapy WFL for tasks assessed/performed           Past Medical History:  Diagnosis Date  . Morbid obesity ([place])     Past Surgical History:  Procedure Laterality Date  . CESAREAN SECTION     x 3  . TUBAL LIGATION      There were no vitals filed for this visit.   Subjective Assessment - 04/11/20 0829    Subjective Patient reported that she is ready to be done with therapy. Denied any pain currently.    Pertinent History chronic knee and ankle pain, obesity, depression    Patient Stated Goals to have less pain    Currently in Pain? No/denies              [facility] PT Assessment - 04/11/20 0001      Assessment   Medical Diagnosis posterior tibial tendon dysfunction    Referring Provider (PT)   Arther Abbott       Precautions   Precautions None      Prior Function   Level of Independence Independent    Vocation Full time employment      Observation/Other Assessments   Focus on Therapeutic Outcomes (FOTO)  64.2%      Strength   Right Ankle Plantar Flexion 4+/5   was 4-   Left Ankle Plantar Flexion 4+/5   was 4                                  PT Short Term Goals - 04/11/20 0830      PT SHORT TERM GOAL #1   Title Pt to be I in HEP to decrease RT ankle pain to no greater tahn 5/10    Baseline 04/11/20: Patient's HEP is going well and RT ankle pain on average is a 2/10 or less    Time 1    Period Weeks    Status Achieved    Target Date 03/28/20      PT SHORT TERM GOAL #2   Title PT to be able to stand/walk for 30 minutes without having to take a  rest break    Baseline 02/29/20: Patient reports ability to stand/walk for 30 minutes    Time 3    Period Weeks    Status Achieved             PT Long Term Goals - 04/11/20 0831      PT LONG TERM GOAL #1   Title PT pain to be no greater than a 2/10 to allow her to be able to stand/walk for an hour at a time without having to take breaks.    Baseline 04/11/20: Patient is able to stand/walk for 45 minutes    Time 6    Period Weeks    Status Partially Met      PT LONG TERM GOAL #2   Title PT ankle strength to be a 5/5 to allow pt to be able to go up and down 8 steps in a reciprocal manner.    Baseline 04/11/20: Achieved in some but not all muscle groups, able to ambulate stairs with reciprocal pattern    Time 3    Period Weeks    Status Partially Met      PT LONG TERM GOAL #3   Title Pt to be able to single leg stance for 30" on her Rt LE to decrease risk of falls.    Baseline 02/29/20: 23 seconds    Time 6    Period Weeks    Status Achieved                 Plan - 04/11/20 0847    Clinical Impression Statement Performed a re-assessment of patient's progress towards goals.  Patient achieved 2 out of 2 short term goals and 1 out of 3 long term goals as well as partially achieved 2 out of 3 long term goals. Patient demonstrated ability to perform reciprocal gait pattern with stair negotiation this session. Patient also demonstrated improved plantarflexion strength, but did still have some deficits in plantarflexion strength. Provided patient with a standing PF exercise for HEP. Patient reported being ready for discharge from therapy and patient achieved the majority of her goals, therefore she is being discharged at this time.    Personal Factors and Comorbidities Comorbidity 1    Comorbidities obesity, depression, chronic knee and ankle pain    Examination-Activity Limitations Stand;Stairs;Squat;Sit;Locomotion Level;Carry;Transfers    Examination-Participation Restrictions Cleaning;Shop;Community Activity;Laundry;Meal Prep    Stability/Clinical Decision Making Stable/Uncomplicated    Rehab Potential Fair    PT Frequency 2x / week    PT Duration 3 weeks    PT Treatment/Interventions ADLs/Self Care Home Management;Aquatic Therapy;Cryotherapy;Electrical Stimulation;Moist Heat;Traction;Balance training;Therapeutic exercise;Therapeutic activities;Functional mobility training;Stair training;Gait training;Orthotic Fit/Training;Ultrasound;Neuromuscular re-education;Patient/family education;Compression bandaging;Passive range of motion;Dry needling    PT Next Visit Plan Discharged    PT Home Exercise Plan 4/13:  Tband for ankle using blue tband, SLS; 5/20: towel crunch, heel and toe raises; 04/11/20: Standing heel raises    Consulted and Agree with Plan of Care Patient           Patient will benefit from skilled therapeutic intervention in order to improve the following deficits and impairments:  Abnormal gait, Pain, Decreased strength, Difficulty walking, Decreased range of motion, Decreased balance, Decreased mobility, Decreased activity tolerance, Increased edema  Visit  Diagnosis: Pain in right ankle and joints of right foot  Difficulty in walking, not elsewhere classified  Chronic pain of right knee     Problem List Patient Active Problem List   Diagnosis Date Noted  . MDD (major depressive  disorder), recurrent, in full remission ([place]) 11/22/2019  . Ankle pain, right 07/26/2019  . Recurrent major depressive disorder, in partial remission ([place]) 11/06/2018  . Left anterior knee pain 11/18/2016  . Vitamin D deficiency 08/05/2016  . Dyspepsia 10/30/2015  . Metabolic syndrome X 61/51/8343  . Dyslipidemia 03/21/2014  . GAD (generalized anxiety disorder) 03/03/2014  . Panic attack 03/03/2014  . Knee pain, right 07/23/2013  . Prediabetes 05/20/2012  . MORBID OBESITY 12/27/2009  . ALLERGIC RHINITIS CAUSE UNSPECIFIED 12/27/2009   Clarene Critchley PT, DPT 8:49 AM, 04/11/20 [place], [place], [address] Phone: [phone]   Fax:  [phone]  Name: [name] MRN: [id number] Date of Birth: 1981-05-05

## 2020-04-11 NOTE — Patient Instructions (Signed)
Heel Raise (Calf Strength / Balance)    Stand with support (no ankle weights). Breathe in. Rise up on tiptoes, breathing out through pursed lips. Hold position to count of _2__. Return slowly, breathing in. Repeat _10__ times per session. Do_2__ sessions per day. Variation: Do without weights.  Copyright  VHI. All rights reserved.

## 2020-04-13 ENCOUNTER — Encounter (HOSPITAL_COMMUNITY): Payer: BC Managed Care – PPO

## 2020-04-18 ENCOUNTER — Encounter (HOSPITAL_COMMUNITY): Payer: BC Managed Care – PPO | Admitting: Physical Therapy

## 2020-04-20 ENCOUNTER — Encounter (HOSPITAL_COMMUNITY): Payer: BC Managed Care – PPO

## 2020-04-25 ENCOUNTER — Encounter (HOSPITAL_COMMUNITY): Payer: BC Managed Care – PPO

## 2020-04-26 ENCOUNTER — Other Ambulatory Visit: Payer: Self-pay | Admitting: Orthopedic Surgery

## 2020-04-26 DIAGNOSIS — M76821 Posterior tibial tendinitis, right leg: Secondary | ICD-10-CM

## 2020-04-26 DIAGNOSIS — M1710 Unilateral primary osteoarthritis, unspecified knee: Secondary | ICD-10-CM

## 2020-04-27 ENCOUNTER — Encounter (HOSPITAL_COMMUNITY): Payer: BC Managed Care – PPO

## 2020-05-01 ENCOUNTER — Other Ambulatory Visit: Payer: Self-pay

## 2020-05-01 ENCOUNTER — Ambulatory Visit (INDEPENDENT_AMBULATORY_CARE_PROVIDER_SITE_OTHER): Payer: BC Managed Care – PPO | Admitting: Family Medicine

## 2020-05-01 ENCOUNTER — Encounter: Payer: Self-pay | Admitting: Family Medicine

## 2020-05-01 DIAGNOSIS — R7303 Prediabetes: Secondary | ICD-10-CM | POA: Diagnosis not present

## 2020-05-01 DIAGNOSIS — F3341 Major depressive disorder, recurrent, in partial remission: Secondary | ICD-10-CM | POA: Diagnosis not present

## 2020-05-01 DIAGNOSIS — Z1159 Encounter for screening for other viral diseases: Secondary | ICD-10-CM

## 2020-05-01 DIAGNOSIS — D649 Anemia, unspecified: Secondary | ICD-10-CM

## 2020-05-01 DIAGNOSIS — E559 Vitamin D deficiency, unspecified: Secondary | ICD-10-CM | POA: Diagnosis not present

## 2020-05-01 DIAGNOSIS — E8881 Metabolic syndrome: Secondary | ICD-10-CM

## 2020-05-01 DIAGNOSIS — E785 Hyperlipidemia, unspecified: Secondary | ICD-10-CM

## 2020-05-01 LAB — POCT GLYCOSYLATED HEMOGLOBIN (HGB A1C): Hemoglobin A1C: 5.7 % — AB (ref 4.0–5.6)

## 2020-05-01 MED ORDER — PHENTERMINE HCL 37.5 MG PO TABS: ORAL_TABLET | ORAL | 1 refills | Status: DC

## 2020-05-01 NOTE — Patient Instructions (Signed)
F/U in 8 weeks , call if uyou need me sooner  1500 call diet sheet given today  Congrats on 5 pound weight loss  Fasting lipid, cmp and EGFr, TSH, vit D and hepatitis C screen and cBc tomorrow  Reconsider the covid vaccine for yourself and family members  Continue half phentermine daily  Weight loss goal of5 to 8 pounds per month\ Think about what you will eat, plan ahead. Choose " clean, green, fresh or frozen" over canned, processed or packaged foods which are more sugary, salty and fatty. 70 to 75% of food eaten should be vegetables and fruit. Three meals at set times with snacks allowed between meals, but they must be fruit or vegetables. Aim to eat over a 12 hour period , example 7 am to 7 pm, and STOP after  your last meal of the day. Drink water,generally about 64 ounces per day, no other drink is as healthy. Fruit juice is best enjoyed in a healthy way, by EATING the fruit.  It is important that you exercise regularly at least 30 minutes 5 times a week. If you develop chest pain, have severe difficulty breathing, or feel very tired, stop exercising immediately and seek medical attention  Thanks for choosing [facility], we consider it a privelige to serve you.

## 2020-05-03 LAB — CBC
Hematocrit: 36.3 % (ref 34.0–46.6)
Hemoglobin: 10.9 g/dL — ABNORMAL LOW (ref 11.1–15.9)
MCH: 25.5 pg — ABNORMAL LOW (ref 26.6–33.0)
MCHC: 30 g/dL — ABNORMAL LOW (ref 31.5–35.7)
MCV: 85 fL (ref 79–97)
Platelets: 370 10*3/uL (ref 150–450)
RBC: 4.27 x10E6/uL (ref 3.77–5.28)
RDW: 14.2 % (ref 11.7–15.4)
WBC: 6.7 10*3/uL (ref 3.4–10.8)

## 2020-05-03 LAB — TSH: TSH: 1.24 u[IU]/mL (ref 0.450–4.500)

## 2020-05-03 LAB — CMP14+EGFR
ALT: 15 IU/L (ref 0–32)
AST: 14 IU/L (ref 0–40)
Albumin/Globulin Ratio: 1.3 (ref 1.2–2.2)
Albumin: 3.9 g/dL (ref 3.8–4.8)
Alkaline Phosphatase: 71 IU/L (ref 48–121)
BUN/Creatinine Ratio: 16 (ref 9–23)
BUN: 14 mg/dL (ref 6–20)
Bilirubin Total: 0.5 mg/dL (ref 0.0–1.2)
CO2: 25 mmol/L (ref 20–29)
Calcium: 8.7 mg/dL (ref 8.7–10.2)
Chloride: 103 mmol/L (ref 96–106)
Creatinine, Ser: 0.88 mg/dL (ref 0.57–1.00)
GFR calc Af Amer: 96 mL/min/{1.73_m2} (ref >59)
GFR calc non Af Amer: 83 mL/min/{1.73_m2} (ref >59)
Globulin, Total: 2.9 g/dL (ref 1.5–4.5)
Glucose: 102 mg/dL — ABNORMAL HIGH (ref 65–99)
Potassium: 4.5 mmol/L (ref 3.5–5.2)
Sodium: 137 mmol/L (ref 134–144)
Total Protein: 6.8 g/dL (ref 6.0–8.5)

## 2020-05-03 LAB — VITAMIN D 25 HYDROXY (VIT D DEFICIENCY, FRACTURES): Vit D, 25-Hydroxy: 17.5 ng/mL — ABNORMAL LOW (ref 30.0–100.0)

## 2020-05-03 LAB — LIPID PANEL
Chol/HDL Ratio: 3.7 ratio (ref 0.0–4.4)
Cholesterol, Total: 157 mg/dL (ref 100–199)
HDL: 42 mg/dL (ref >39)
LDL Chol Calc (NIH): 100 mg/dL — ABNORMAL HIGH (ref 0–99)
Triglycerides: 76 mg/dL (ref 0–149)
VLDL Cholesterol Cal: 15 mg/dL (ref 5–40)

## 2020-05-03 LAB — HEPATITIS C ANTIBODY: Hep C Virus Ab: <0.1 s/co ratio (ref 0.0–0.9)

## 2020-05-06 ENCOUNTER — Encounter: Payer: Self-pay | Admitting: Family Medicine

## 2020-05-06 DIAGNOSIS — D649 Anemia, unspecified: Secondary | ICD-10-CM | POA: Insufficient documentation

## 2020-05-06 MED ORDER — ERGOCALCIFEROL 1.25 MG (50000 UT) PO CAPS: 50000.0000 [IU] | ORAL_CAPSULE | ORAL | 1 refills | Status: DC

## 2020-05-06 NOTE — Assessment & Plan Note (Addendum)
  Patient re-educated about  the importance of commitment to a  minimum of 150 minutes of exercise per week as able.  The importance of healthy food choices with portion control discussed, as well as eating regularly and within a 12 hour window most days. The need to choose "clean , green" food 50 to 75% of the time is discussed, as well as to make water the primary drink and set a goal of 64 ounces water daily. Improved , continue half phentermine daily   Weight /BMI 05/01/2020 12/16/2019 10/04/2019  WEIGHT 306 lb 311 lb 311 lb  HEIGHT 5\' 0"  5\' 0"  5\' 0"   BMI 59.76 kg/m2 60.74 kg/m2 60.74 kg/m2  Some encounter information is confidential and restricted. Go to Review Flowsheets activity to see all data.

## 2020-05-06 NOTE — Assessment & Plan Note (Signed)
Improved, she is congratulatd on this Patient educated about the importance of limiting  Carbohydrate intake , the need to commit to daily physical activity for a minimum of 30 minutes , and to commit weight loss. The fact that changes in all these areas will reduce or eliminate all together the development of diabetes is stressed.   Diabetic Labs Latest Ref Rng & Units 05/02/2020 05/01/2020 07/26/2019 06/08/2018 12/20/2017  HbA1c 4.0 - 5.6 % - 5.7(A) 6.1(H) 5.8(H) 5.5  Chol 100 - 199 mg/dL 315 - 400 - 867  HDL >61 mg/dL 42 - 95(K) - 93(O)  Calc LDL 0 - 99 mg/dL 671(I) - 93 - 90  Triglycerides 0 - 149 mg/dL 76 - 75 - 66  Creatinine 0.57 - 1.00 mg/dL 4.58 - 0.99 8.33 8.25   BP/Weight 05/01/2020 12/16/2019 10/04/2019 07/26/2019 04/28/2019 12/08/2018 09/19/2018  Systolic BP 137 130 130 122 122 128 136  Diastolic BP 83 73 73 84 82 78 90  Wt. (Lbs) 306 311 311 312 307 297 283  BMI 59.76 60.74 60.74 60.93 59.96 58 55.27  Some encounter information is confidential and restricted. Go to Review Flowsheets activity to see all data.   No flowsheet data found.

## 2020-05-06 NOTE — Assessment & Plan Note (Signed)
Updated lab needed at/ before next visit.   

## 2020-05-06 NOTE — Progress Notes (Signed)
[name]     MRN: [id number]      DOB: 15-Aug-1981   HPI [name] is here for follow up and re-evaluation of chronic medical conditions, medication management and review of any available recent lab and radiology data.  Preventive health is updated, specifically  Cancer screening and Immunization.   Questions or concerns regarding consultations or procedures which the PT has had in the interim are  addressed. The PT denies any adverse reactions to current medications since the last visit.  There are no new concerns.  There are no specific complaints   ROS Denies recent fever or chills. Denies sinus pressure, nasal congestion, ear pain or sore throat. Denies chest congestion, productive cough or wheezing. Denies chest pains, palpitations and leg swelling Denies abdominal pain, nausea, vomiting,diarrhea or constipation.   Denies dysuria, frequency, hesitancy or incontinence. Denies joint pain, swelling and limitation in mobility. Denies headaches, seizures, numbness, or tingling. Denies depression, anxiety or insomnia. Denies skin break down or rash.   PE  BP 137/83   Pulse (!) 104   Resp 16   Ht 5' (1.524 m)   Wt (!) 306 lb (138.8 kg)   SpO2 96%   BMI 59.76 kg/m   Patient alert and oriented and in no cardiopulmonary distress.  HEENT: No facial asymmetry, EOMI,     Neck supple .  Chest: Clear to auscultation bilaterally.  CVS: S1, S2 no murmurs, no S3.Regular rate.  ABD: Soft non tender.   Ext: No edema  MS: Adequate ROM spine, shoulders, hips and knees.  Skin: Intact, no ulcerations or rash noted.  Psych: Good eye contact, normal affect. Memory intact not anxious or depressed appearing.  CNS: CN 2-12 intact, power,  normal throughout.no focal deficits noted.   Assessment & Plan  MORBID OBESITY  Patient re-educated about  the importance of commitment to a  minimum of 150 minutes of exercise per week as able.  The importance of healthy food  choices with portion control discussed, as well as eating regularly and within a 12 hour window most days. The need to choose "clean , green" food 50 to 75% of the time is discussed, as well as to make water the primary drink and set a goal of 64 ounces water daily. Improved , continue half phentermine daily   Weight /BMI 05/01/2020 12/16/2019 10/04/2019  WEIGHT 306 lb 311 lb 311 lb  HEIGHT 5\' 0"  5\' 0"  5\' 0"   BMI 59.76 kg/m2 60.74 kg/m2 60.74 kg/m2  Some encounter information is confidential and restricted. Go to Review Flowsheets activity to see all data.      Recurrent major depressive disorder, in partial remission (HCC) Managed by Psych, not suicidal or homicidal  Vitamin D deficiency Take once weekly vit D for 6 months, then OTC daily vit D3 , 1000 IU once daily  Prediabetes Improved, she is congratulatd on this Patient educated about the importance of limiting  Carbohydrate intake , the need to commit to daily physical activity for a minimum of 30 minutes , and to commit weight loss. The fact that changes in all these areas will reduce or eliminate all together the development of diabetes is stressed.   Diabetic Labs Latest Ref Rng & Units 05/02/2020 05/01/2020 07/26/2019 06/08/2018 12/20/2017  HbA1c 4.0 - 5.6 % - 5.7(A) 6.1(H) 5.8(H) 5.5  Chol 100 - 199 mg/dL 09/25/2019 - 06/10/2018 - 02/19/2018  HDL 948 mg/dL 42 - 546) - 270)  Calc LDL 0 - 99 mg/dL >35) -  93 - 90  Triglycerides 0 - 149 mg/dL 76 - 75 - 66  Creatinine 0.57 - 1.00 mg/dL 1.61 - 0.96 0.45 4.09   BP/Weight 05/01/2020 12/16/2019 10/04/2019 07/26/2019 04/28/2019 12/08/2018 09/19/2018  Systolic BP 137 130 130 122 122 128 136  Diastolic BP 83 73 73 84 82 78 90  Wt. (Lbs) 306 311 311 312 307 297 283  BMI 59.76 60.74 60.74 60.93 59.96 58 55.27  Some encounter information is confidential and restricted. Go to Review Flowsheets activity to see all data.   No flowsheet data found.

## 2020-05-06 NOTE — Assessment & Plan Note (Signed)
Managed by Psych, not suicidal or homicidal ?

## 2020-05-06 NOTE — Assessment & Plan Note (Signed)
Take once weekly vit D for 6 months, then OTC daily vit D3 , 1000 IU once daily
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
## 2020-05-15 ENCOUNTER — Telehealth (INDEPENDENT_AMBULATORY_CARE_PROVIDER_SITE_OTHER): Payer: BC Managed Care – PPO | Admitting: Psychiatry

## 2020-05-15 ENCOUNTER — Other Ambulatory Visit: Payer: Self-pay

## 2020-05-15 ENCOUNTER — Encounter (HOSPITAL_COMMUNITY): Payer: Self-pay | Admitting: Psychiatry

## 2020-05-15 DIAGNOSIS — F330 Major depressive disorder, recurrent, mild: Secondary | ICD-10-CM | POA: Diagnosis not present

## 2020-05-15 MED ORDER — BUSPIRONE HCL 15 MG PO TABS: ORAL_TABLET | ORAL | 0 refills | Status: DC

## 2020-05-15 MED ORDER — SERTRALINE HCL 100 MG PO TABS: 100.0000 mg | ORAL_TABLET | Freq: Every day | ORAL | 1 refills | Status: DC

## 2020-05-15 NOTE — Patient Instructions (Signed)
1. Increase sertraline 100 mg daily  2. Continue Buspar 7.5 mg three timesa day  3.Next appointment:9/27 at 1:20

## 2020-05-26 LAB — FERRITIN: Ferritin: 168 ng/mL — ABNORMAL HIGH (ref 15–150)

## 2020-05-26 LAB — SPECIMEN STATUS REPORT

## 2020-05-26 LAB — IRON: Iron: 34 ug/dL (ref 27–159)

## 2020-06-01 ENCOUNTER — Other Ambulatory Visit: Payer: BC Managed Care – PPO

## 2020-06-01 ENCOUNTER — Other Ambulatory Visit: Payer: Self-pay

## 2020-06-01 DIAGNOSIS — Z20822 Contact with and (suspected) exposure to covid-19: Secondary | ICD-10-CM

## 2020-06-02 LAB — NOVEL CORONAVIRUS, NAA: SARS-CoV-2, NAA: NOT DETECTED

## 2020-06-02 LAB — SARS-COV-2, NAA 2 DAY TAT

## 2020-06-05 ENCOUNTER — Telehealth: Payer: BC Managed Care – PPO | Admitting: Family

## 2020-06-05 DIAGNOSIS — R0600 Dyspnea, unspecified: Secondary | ICD-10-CM

## 2020-06-05 NOTE — Progress Notes (Signed)
Based on what you shared with me, I feel your condition warrants further evaluation and I recommend that you be seen for a face to face office visit.   NOTE: If you entered your credit card information for this eVisit, you will not be charged. You may see a "hold" on your card for the $35 but that hold will drop off and you will not have a charge processed.   If you are having a true medical emergency please call 911.      For an urgent face to face visit, [facility] has five urgent care centers for your convenience:      NEW:  [facility] [place] Get Driving Directions [phone] [address] [place], [place] [address] . 10 am - 6pm Monday - Friday    [facility] Get Driving Directions [phone] [address] [place], [place] [address] . 10 am to 8 pm Monday-Friday . 12 pm to 8 pm Saturday-Sunday     [facility] Get Driving Directions [phone] [address] [place], [place] [address] . 8 am to 8 pm Monday-Friday . 9 am to 6 pm Saturday . 11 am to 6 pm Sunday     [facility] [place] Get Driving Directions  [phone] [address] [place], [place] [address] . 8 am to 8 pm Monday-Friday . 8 am to 4 pm Saturday-Sunday   [facility] [place] Get Driving Directions [phone] [address] [place], [place] [address] . 12 pm to 6 pm Monday-Friday      Your e-visit answers were reviewed by a board certified advanced clinical practitioner to complete your personal care plan.  Thank you for using e-Visits.     
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
## 2020-07-06 ENCOUNTER — Ambulatory Visit: Payer: BC Managed Care – PPO | Admitting: Family Medicine

## 2020-07-10 ENCOUNTER — Other Ambulatory Visit: Payer: Self-pay

## 2020-07-10 ENCOUNTER — Encounter (HOSPITAL_COMMUNITY): Payer: Self-pay | Admitting: Psychiatry

## 2020-07-10 ENCOUNTER — Telehealth (INDEPENDENT_AMBULATORY_CARE_PROVIDER_SITE_OTHER): Payer: BC Managed Care – PPO | Admitting: Psychiatry

## 2020-07-10 DIAGNOSIS — F3341 Major depressive disorder, recurrent, in partial remission: Secondary | ICD-10-CM | POA: Diagnosis not present

## 2020-07-10 MED ORDER — SERTRALINE HCL 100 MG PO TABS: 100.0000 mg | ORAL_TABLET | Freq: Every day | ORAL | 0 refills | Status: DC

## 2020-07-10 MED ORDER — BUSPIRONE HCL 15 MG PO TABS: ORAL_TABLET | ORAL | 0 refills | Status: DC

## 2020-07-10 NOTE — Patient Instructions (Signed)
1. Continue sertraline 100 mg daily  2.Continue Buspar 7.5 mg three timesa day  3.Next appointment:12/20 at 1:40

## 2020-07-19 ENCOUNTER — Other Ambulatory Visit: Payer: Self-pay | Admitting: Orthopedic Surgery

## 2020-07-19 DIAGNOSIS — M1710 Unilateral primary osteoarthritis, unspecified knee: Secondary | ICD-10-CM

## 2020-07-19 DIAGNOSIS — M76821 Posterior tibial tendinitis, right leg: Secondary | ICD-10-CM

## 2020-07-21 NOTE — Telephone Encounter (Signed)
Rx refill request
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
## 2020-10-02 ENCOUNTER — Telehealth (HOSPITAL_COMMUNITY): Payer: BC Managed Care – PPO | Admitting: Psychiatry

## 2020-10-02 ENCOUNTER — Other Ambulatory Visit: Payer: Self-pay

## 2020-10-02 ENCOUNTER — Telehealth: Payer: BC Managed Care – PPO | Admitting: Psychiatry

## 2020-10-02 ENCOUNTER — Telehealth: Payer: Self-pay | Admitting: Psychiatry

## 2020-10-02 NOTE — Telephone Encounter (Signed)
Sent link for video visit through Epic. Patient did not sign in. Called the patient  for appointment scheduled today. The patient did not answer the phone. Left voice message to contact the office.  

## 2020-10-03 ENCOUNTER — Encounter: Payer: Self-pay | Admitting: Psychiatry

## 2020-10-03 ENCOUNTER — Telehealth (INDEPENDENT_AMBULATORY_CARE_PROVIDER_SITE_OTHER): Payer: BC Managed Care – PPO | Admitting: Psychiatry

## 2020-10-03 DIAGNOSIS — F3341 Major depressive disorder, recurrent, in partial remission: Secondary | ICD-10-CM | POA: Diagnosis not present

## 2020-10-03 MED ORDER — BUSPIRONE HCL 15 MG PO TABS: ORAL_TABLET | ORAL | 1 refills | Status: DC

## 2020-10-03 MED ORDER — SERTRALINE HCL 100 MG PO TABS: 100.0000 mg | ORAL_TABLET | Freq: Every day | ORAL | 1 refills | Status: DC
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
## 2020-11-24 ENCOUNTER — Telehealth: Payer: Self-pay

## 2020-11-24 NOTE — Telephone Encounter (Signed)
Medical eval forms received  Copied Noted Sleeved   

## 2020-12-04 ENCOUNTER — Encounter: Payer: Self-pay | Admitting: Family Medicine

## 2020-12-04 ENCOUNTER — Telehealth (INDEPENDENT_AMBULATORY_CARE_PROVIDER_SITE_OTHER): Payer: BC Managed Care – PPO | Admitting: Family Medicine

## 2020-12-04 ENCOUNTER — Other Ambulatory Visit: Payer: Self-pay

## 2020-12-04 VITALS — Ht 60.0 in | Wt 308.0 lb

## 2020-12-04 DIAGNOSIS — R7303 Prediabetes: Secondary | ICD-10-CM | POA: Diagnosis not present

## 2020-12-04 DIAGNOSIS — F3341 Major depressive disorder, recurrent, in partial remission: Secondary | ICD-10-CM | POA: Diagnosis not present

## 2020-12-04 DIAGNOSIS — Z0289 Encounter for other administrative examinations: Secondary | ICD-10-CM

## 2020-12-04 NOTE — Progress Notes (Signed)
Virtual Visit via Telephone Note  I connected with [name] on 12/04/20 at  4:00 PM EST by telephone and verified that I am speaking with the correct person using two identifiers.  Location: Patient: home Provider: ofice   I discussed the limitations, risks, security and privacy concerns of performing an evaluation and management service by telephone and the availability of in person appointments. I also discussed with the patient that there may be a patient responsible charge related to this service. The patient expressed understanding and agreed to proceed.   History of Present Illness: Form completion for foster care  , curently has a 40 y/o  For the past 4 months, and has been involved with foster care for several years Denies recent fever or chills. Denies sinus pressure, nasal congestion, ear pain or sore throat. Denies chest congestion, productive cough or wheezing. Denies chest pains, palpitations and leg swelling Denies abdominal pain, nausea, vomiting,diarrhea or constipation.   Denies dysuria, frequency, hesitancy or incontinence. Denies joint pain, swelling and limitation in mobility. Denies headaches, seizures, numbness, or tingling. Denies uncontrolled depression, anxiety or insomnia. Denies skin break down or rash. Medications reviewed and current list is accurate      Observations/Objective: Ht 5' (1.524 m)   Wt (!) 308 lb (139.7 kg)   BMI 60.15 kg/m  Good communication with no confusion and intact memory. Alert and oriented x 3 No signs of respiratory distress during speech    Assessment and Plan: Encounter for completion of form with patient History and exam as documented. No barriers identified with regard to suitability for [name] to be a foster parent  MORBID OBESITY  Patient re-educated about  the importance of commitment to a  minimum of 150 minutes of exercise per week as able.  The importance of healthy food choices with portion  control discussed, as well as eating regularly and within a 12 hour window most days. The need to choose "clean , green" food 50 to 75% of the time is discussed, as well as to make water the primary drink and set a goal of 64 ounces water daily.    Weight /BMI 12/04/2020 05/01/2020 12/16/2019  WEIGHT 308 lb 306 lb 311 lb  HEIGHT 5\' 0"  5\' 0"  5\' 0"   BMI 60.15 kg/m2 59.76 kg/m2 60.74 kg/m2  Some encounter information is confidential and restricted. Go to Review Flowsheets activity to see all data.      Prediabetes Patient educated about the importance of limiting  Carbohydrate intake , the need to commit to daily physical activity for a minimum of 30 minutes , and to commit weight loss. The fact that changes in all these areas will reduce or eliminate all together the development of diabetes is stressed.  Updated lab needed at/ before next visit.   Diabetic Labs Latest Ref Rng & Units 05/02/2020 05/01/2020 07/26/2019 06/08/2018 12/20/2017  HbA1c 4.0 - 5.6 % - 5.7(A) 6.1(H) 5.8(H) 5.5  Chol 100 - 199 mg/dL 09/25/2019 - 06/10/2018 - 02/19/2018  HDL 431 mg/dL 42 - 540) - 086)  Calc LDL 0 - 99 mg/dL >76) - 93 - 90  Triglycerides 0 - 149 mg/dL 76 - 75 - 66  Creatinine 0.57 - 1.00 mg/dL 19(J - 09(T 267(T 2.45   BP/Weight 12/04/2020 05/01/2020 12/16/2019 10/04/2019 07/26/2019 04/28/2019 12/08/2018  Systolic BP - 137 130 130 122 09/25/2019 128  Diastolic BP - 83 73 73 84 82 78  Wt. (Lbs) 308 306 311 311 312 307 297  BMI  60.15 59.76 60.74 60.74 60.93 59.96 58  Some encounter information is confidential and restricted. Go to Review Flowsheets activity to see all data.   No flowsheet data found.    Recurrent major depressive disorder, in partial remission (HCC) Managed by Psych and controlled No contraindication to foster parenting  '  Follow Up Instructions:    I discussed the assessment and treatment plan with the patient. The patient was provided an opportunity to ask questions and all were answered. The patient agreed  with the plan and demonstrated an understanding of the instructions.   The patient was advised to call back or seek an in-person evaluation if the symptoms worsen or if the condition fails to improve as anticipated.  I provided 22 minutes of non-face-to-face time during this encounter.   Syliva Overman, MD

## 2020-12-04 NOTE — Patient Instructions (Addendum)
F/U in 3 months, call if you need me sooner  TdAp and flu vaccine this week Wednesday, Nurse please verify time and date with pt  Non fast HBA1C, iron, CBC, ferritin and Vit D this week  Please consider mammogram , which is due and a good screening tool , also sleep study since you snore  It is important that you exercise regularly at least 30 minutes 5 times a week. If you develop chest pain, have severe difficulty breathing, or feel very tired, stop exercising immediately and seek medical attention   Weight loss goal of 9 to 20 pounds   Think about what you will eat, plan ahead. Choose " clean, green, fresh or frozen" over canned, processed or packaged foods which are more sugary, salty and fatty. 70 to 75% of food eaten should be vegetables and fruit. Three meals at set times with snacks allowed between meals, but they must be fruit or vegetables. Aim to eat over a 12 hour period , example 7 am to 7 pm, and STOP after  your last meal of the day. Drink water,generally about 64 ounces per day, no other drink is as healthy. Fruit juice is best enjoyed in a healthy way, by EATING the fruit. Thanks for choosing [facility], we consider it a privelige to serve you.

## 2020-12-05 ENCOUNTER — Encounter: Payer: Self-pay | Admitting: Family Medicine

## 2020-12-05 NOTE — Assessment & Plan Note (Signed)
Managed by Psych and controlled No contraindication to foster parenting

## 2020-12-05 NOTE — Assessment & Plan Note (Signed)
Patient educated about the importance of limiting  Carbohydrate intake , the need to commit to daily physical activity for a minimum of 30 minutes , and to commit weight loss. The fact that changes in all these areas will reduce or eliminate all together the development of diabetes is stressed.  Updated lab needed at/ before next visit.   Diabetic Labs Latest Ref Rng & Units 05/02/2020 05/01/2020 07/26/2019 06/08/2018 12/20/2017  HbA1c 4.0 - 5.6 % - 5.7(A) 6.1(H) 5.8(H) 5.5  Chol 100 - 199 mg/dL 630 - 160 - 109  HDL >32 mg/dL 42 - 35(T) - 73(U)  Calc LDL 0 - 99 mg/dL 202(R) - 93 - 90  Triglycerides 0 - 149 mg/dL 76 - 75 - 66  Creatinine 0.57 - 1.00 mg/dL 4.27 - 0.62 3.76 2.83   BP/Weight 12/04/2020 05/01/2020 12/16/2019 10/04/2019 07/26/2019 04/28/2019 12/08/2018  Systolic BP - 137 130 130 122 151 128  Diastolic BP - 83 73 73 84 82 78  Wt. (Lbs) 308 306 311 311 312 307 297  BMI 60.15 59.76 60.74 60.74 60.93 59.96 58  Some encounter information is confidential and restricted. Go to Review Flowsheets activity to see all data.   No flowsheet data found.

## 2020-12-05 NOTE — Assessment & Plan Note (Signed)
  Patient re-educated about  the importance of commitment to a  minimum of 150 minutes of exercise per week as able.  The importance of healthy food choices with portion control discussed, as well as eating regularly and within a 12 hour window most days. The need to choose "clean , green" food 50 to 75% of the time is discussed, as well as to make water the primary drink and set a goal of 64 ounces water daily.    Weight /BMI 12/04/2020 05/01/2020 12/16/2019  WEIGHT 308 lb 306 lb 311 lb  HEIGHT 5\' 0"  5\' 0"  5\' 0"   BMI 60.15 kg/m2 59.76 kg/m2 60.74 kg/m2  Some encounter information is confidential and restricted. Go to Review Flowsheets activity to see all data.

## 2020-12-05 NOTE — Assessment & Plan Note (Signed)
History and exam as documented. No barriers identified with regard to suitability for Ms Laver to be a foster parent

## 2020-12-06 ENCOUNTER — Other Ambulatory Visit: Payer: Self-pay

## 2020-12-06 ENCOUNTER — Telehealth: Payer: Self-pay

## 2020-12-06 ENCOUNTER — Ambulatory Visit (INDEPENDENT_AMBULATORY_CARE_PROVIDER_SITE_OTHER): Payer: BC Managed Care – PPO

## 2020-12-06 DIAGNOSIS — Z23 Encounter for immunization: Secondary | ICD-10-CM

## 2020-12-06 DIAGNOSIS — D539 Nutritional anemia, unspecified: Secondary | ICD-10-CM

## 2020-12-06 DIAGNOSIS — E559 Vitamin D deficiency, unspecified: Secondary | ICD-10-CM

## 2020-12-06 DIAGNOSIS — E8881 Metabolic syndrome: Secondary | ICD-10-CM

## 2020-12-06 NOTE — Telephone Encounter (Signed)
Need labs order. Pt called said she will come in this afternoon to get this done.       Labs to be ordered: Non fast HBA1C, iron, CBC,           ferritin and Vit D

## 2020-12-06 NOTE — Telephone Encounter (Signed)
Labs ordered.

## 2020-12-07 ENCOUNTER — Other Ambulatory Visit: Payer: Self-pay | Admitting: Family Medicine

## 2020-12-07 LAB — CBC
Hematocrit: 35.5 % (ref 34.0–46.6)
Hemoglobin: 11.3 g/dL (ref 11.1–15.9)
MCH: 26.2 pg — ABNORMAL LOW (ref 26.6–33.0)
MCHC: 31.8 g/dL (ref 31.5–35.7)
MCV: 82 fL (ref 79–97)
Platelets: 378 10*3/uL (ref 150–450)
RBC: 4.31 x10E6/uL (ref 3.77–5.28)
RDW: 14.1 % (ref 11.7–15.4)
WBC: 7.8 10*3/uL (ref 3.4–10.8)

## 2020-12-07 LAB — HEMOGLOBIN A1C
Est. average glucose Bld gHb Est-mCnc: 123 mg/dL
Hgb A1c MFr Bld: 5.9 % — ABNORMAL HIGH (ref 4.8–5.6)

## 2020-12-07 LAB — FERRITIN: Ferritin: 135 ng/mL (ref 15–150)

## 2020-12-07 LAB — IRON: Iron: 54 ug/dL (ref 27–159)

## 2020-12-07 LAB — VITAMIN D 25 HYDROXY (VIT D DEFICIENCY, FRACTURES): Vit D, 25-Hydroxy: 20.4 ng/mL — ABNORMAL LOW (ref 30.0–100.0)

## 2020-12-07 MED ORDER — ERGOCALCIFEROL 1.25 MG (50000 UT) PO CAPS: 50000.0000 [IU] | ORAL_CAPSULE | ORAL | 2 refills | Status: DC

## 2020-12-10 DIAGNOSIS — F32A Depression, unspecified: Secondary | ICD-10-CM

## 2020-12-11 NOTE — Telephone Encounter (Signed)
complete
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
## 2021-01-30 ENCOUNTER — Other Ambulatory Visit: Payer: Self-pay

## 2021-01-30 ENCOUNTER — Encounter: Payer: Self-pay | Admitting: Psychiatry

## 2021-01-30 ENCOUNTER — Telehealth (INDEPENDENT_AMBULATORY_CARE_PROVIDER_SITE_OTHER): Payer: BC Managed Care – PPO | Admitting: Psychiatry

## 2021-01-30 DIAGNOSIS — R5383 Other fatigue: Secondary | ICD-10-CM

## 2021-01-30 DIAGNOSIS — F411 Generalized anxiety disorder: Secondary | ICD-10-CM | POA: Diagnosis not present

## 2021-01-30 DIAGNOSIS — F3341 Major depressive disorder, recurrent, in partial remission: Secondary | ICD-10-CM

## 2021-01-30 MED ORDER — BUSPIRONE HCL 15 MG PO TABS: 7.5000 mg | ORAL_TABLET | Freq: Three times a day (TID) | ORAL | 1 refills | Status: DC

## 2021-01-30 MED ORDER — SERTRALINE HCL 100 MG PO TABS: 100.0000 mg | ORAL_TABLET | Freq: Every day | ORAL | 1 refills | Status: DC

## 2021-01-30 NOTE — Patient Instructions (Signed)
1.Continuesertraline 100 mg daily  2.Continue Buspar 7.5 mg three timesa day  3.Next appointment:7/19 at 1 PM

## 2021-03-06 ENCOUNTER — Encounter: Payer: Self-pay | Admitting: Family Medicine

## 2021-03-06 ENCOUNTER — Telehealth: Payer: BC Managed Care – PPO | Admitting: Family Medicine

## 2021-03-06 ENCOUNTER — Other Ambulatory Visit: Payer: Self-pay

## 2021-03-06 VITALS — Ht 60.0 in | Wt 311.0 lb

## 2021-03-06 DIAGNOSIS — F4321 Adjustment disorder with depressed mood: Secondary | ICD-10-CM | POA: Insufficient documentation

## 2021-03-06 DIAGNOSIS — R7303 Prediabetes: Secondary | ICD-10-CM

## 2021-03-06 DIAGNOSIS — F411 Generalized anxiety disorder: Secondary | ICD-10-CM | POA: Diagnosis not present

## 2021-03-06 DIAGNOSIS — F3341 Major depressive disorder, recurrent, in partial remission: Secondary | ICD-10-CM | POA: Diagnosis not present

## 2021-03-06 DIAGNOSIS — E785 Hyperlipidemia, unspecified: Secondary | ICD-10-CM

## 2021-03-06 MED ORDER — PHENTERMINE HCL 37.5 MG PO TABS
ORAL_TABLET | ORAL | 0 refills | Status: DC
Start: 2021-03-06 — End: 2022-03-21

## 2021-03-06 NOTE — Assessment & Plan Note (Signed)
Controlled, no change in medication  

## 2021-03-06 NOTE — Assessment & Plan Note (Signed)
Hyperlipidemia:Low fat diet discussed and encouraged.   Lipid Panel  Lab Results  Component Value Date   CHOL 157 05/02/2020   HDL 42 05/02/2020   LDLCALC 100 (H) 05/02/2020   TRIG 76 05/02/2020   CHOLHDL 3.7 05/02/2020   Needs to reduce ft in diet

## 2021-03-06 NOTE — Patient Instructions (Signed)
Annual exam in 6 to 8 weeks, no pap.   New for weight  Loss is phentermine half every morning  You are referred to therapy to help with the grief you are experiencing from recent los of your Mom. Please accept our condolence   Please schedule average risk screening mammogram at checkout  Please get fasting CBC, lipid, cmp and eGFR, TSH, HBA1C and vit D 1 week before next visit  Please send 1200 cal diet  With discharge paperwork  It is important that you exercise regularly at least 30 minutes 5 times a week. If you develop chest pain, have severe difficulty breathing, or feel very tired, stop exercising immediately and seek medical attention

## 2021-03-06 NOTE — Assessment & Plan Note (Signed)
  Patient re-educated about  the importance of commitment to a  minimum of 150 minutes of exercise per week as able.  The importance of healthy food choices with portion control discussed, as well as eating regularly and within a 12 hour window most days. The need to choose "clean , green" food 50 to 75% of the time is discussed, as well as to make water the primary drink and set a goal of 64 ounces water daily.    Weight /BMI 03/06/2021 12/04/2020 05/01/2020  WEIGHT 311 lb 308 lb 306 lb  HEIGHT 5\' 0"  5\' 0"  5\' 0"   BMI 60.74 kg/m2 60.15 kg/m2 59.76 kg/m2  Some encounter information is confidential and restricted. Go to Review Flowsheets activity to see all data.  start phentermine one half daily

## 2021-03-06 NOTE — Progress Notes (Signed)
Virtual Visit via Telephone Note  I connected with [name] on 03/06/21 at  4:00 PM EDT by telephone and verified that I am speaking with the correct person using two identifiers.  Location: Patient: home Provider: office   I discussed the limitations, risks, security and privacy concerns of performing an evaluation and management service by telephone and the availability of in person appointments. I also discussed with the patient that there may be a patient responsible charge related to this service. The patient expressed understanding and agreed to proceed.   History of Present Illness: F/U chronic problems and address any new or current concerns. Review and update medications and allergies. Review recent lab and radiologic data . Update routine health maintainace. Review an encourage improved health habits to include nutrition, exercise and  sleep . Denies recent fever or chills. Denies sinus pressure, nasal congestion, ear pain or sore throat. Denies chest congestion, productive cough or wheezing. Denies chest pains, palpitations and leg swelling Denies abdominal pain, nausea, vomiting,diarrhea or constipation.   Denies dysuria, frequency, hesitancy or incontinence. Denies joint pain, swelling and limitation in mobility. Denies headaches, seizures, numbness, or tingling. C/o increased anxiety and grief, following recent unexpected loss of her Mom approx 2 weeks ago. Denies skin break down or rash.        Observations/Objective: Ht 5' (1.524 m)   Wt (!) 311 lb (141.1 kg)   LMP 02/04/2021   BMI 60.74 kg/m  Good communication with no confusion and intact memory. Alert and oriented x 3 No signs of respiratory distress during speech    Assessment and Plan: Grief Requests therapy to deal with grief associated with loss of mom on 02/19/2021, will refer  Dyslipidemia Hyperlipidemia:Low fat diet discussed and encouraged.   Lipid Panel  Lab Results   Component Value Date   CHOL 157 05/02/2020   HDL 42 05/02/2020   LDLCALC 100 (H) 05/02/2020   TRIG 76 05/02/2020   CHOLHDL 3.7 05/02/2020   Needs to reduce ft in diet    GAD (generalized anxiety disorder) Controlled, no change in medication   MORBID OBESITY  Patient re-educated about  the importance of commitment to a  minimum of 150 minutes of exercise per week as able.  The importance of healthy food choices with portion control discussed, as well as eating regularly and within a 12 hour window most days. The need to choose "clean , green" food 50 to 75% of the time is discussed, as well as to make water the primary drink and set a goal of 64 ounces water daily.    Weight /BMI 03/06/2021 12/04/2020 05/01/2020  WEIGHT 311 lb 308 lb 306 lb  HEIGHT 5\' 0"  5\' 0"  5\' 0"   BMI 60.74 kg/m2 60.15 kg/m2 59.76 kg/m2  Some encounter information is confidential and restricted. Go to Review Flowsheets activity to see all data.  start phentermine one half daily    Prediabetes Patient educated about the importance of limiting  Carbohydrate intake , the need to commit to daily physical activity for a minimum of 30 minutes , and to commit weight loss. The fact that changes in all these areas will reduce or eliminate all together the development of diabetes is stressed.  deteriorated  Diabetic Labs Latest Ref Rng & Units 12/06/2020 05/02/2020 05/01/2020 07/26/2019 06/08/2018  HbA1c 4.8 - 5.6 % 5.9(H) - 5.7(A) 6.1(H) 5.8(H)  Chol 100 - 199 mg/dL - 05/03/2020 - 09/25/2019 -  HDL 06/10/2018 mg/dL - 42 - 062) -  Calc  LDL 0 - 99 mg/dL - 546(T) - 93 -  Triglycerides 0 - 149 mg/dL - 76 - 75 -  Creatinine 0.57 - 1.00 mg/dL - 0.35 - 4.65 6.81   BP/Weight 03/06/2021 12/04/2020 05/01/2020 12/16/2019 10/04/2019 07/26/2019 04/28/2019  Systolic BP - - 137 130 130 122 122  Diastolic BP - - 83 73 73 84 82  Wt. (Lbs) 311 308 306 311 311 312 307  BMI 60.74 60.15 59.76 60.74 60.74 60.93 59.96  Some encounter information is  confidential and restricted. Go to Review Flowsheets activity to see all data.   No flowsheet data found.    Recurrent major depressive disorder, in partial remission (HCC) Controlled, no change in medication     Follow Up Instructions:    I discussed the assessment and treatment plan with the patient. The patient was provided an opportunity to ask questions and all were answered. The patient agreed with the plan and demonstrated an understanding of the instructions.   The patient was advised to call back or seek an in-person evaluation if the symptoms worsen or if the condition fails to improve as anticipated.  I provided 15 minutes of non-face-to-face time during this encounter.   Syliva Overman, MD  '

## 2021-03-06 NOTE — Assessment & Plan Note (Signed)
Patient educated about the importance of limiting  Carbohydrate intake , the need to commit to daily physical activity for a minimum of 30 minutes , and to commit weight loss. The fact that changes in all these areas will reduce or eliminate all together the development of diabetes is stressed.  deteriorated  Diabetic Labs Latest Ref Rng & Units 12/06/2020 05/02/2020 05/01/2020 07/26/2019 06/08/2018  HbA1c 4.8 - 5.6 % 5.9(H) - 5.7(A) 6.1(H) 5.8(H)  Chol 100 - 199 mg/dL - 169 - 678 -  HDL >93 mg/dL - 42 - 81(O) -  Calc LDL 0 - 99 mg/dL - 175(Z) - 93 -  Triglycerides 0 - 149 mg/dL - 76 - 75 -  Creatinine 0.57 - 1.00 mg/dL - 0.25 - 8.52 7.78   BP/Weight 03/06/2021 12/04/2020 05/01/2020 12/16/2019 10/04/2019 07/26/2019 04/28/2019  Systolic BP - - 137 130 130 122 122  Diastolic BP - - 83 73 73 84 82  Wt. (Lbs) 311 308 306 311 311 312 307  BMI 60.74 60.15 59.76 60.74 60.74 60.93 59.96  Some encounter information is confidential and restricted. Go to Review Flowsheets activity to see all data.   No flowsheet data found.

## 2021-03-06 NOTE — Assessment & Plan Note (Signed)
Requests therapy to deal with grief associated with loss of mom on 02/19/2021, will refer

## 2021-03-07 ENCOUNTER — Other Ambulatory Visit: Payer: Self-pay

## 2021-03-07 DIAGNOSIS — D649 Anemia, unspecified: Secondary | ICD-10-CM

## 2021-03-07 DIAGNOSIS — Z1329 Encounter for screening for other suspected endocrine disorder: Secondary | ICD-10-CM

## 2021-03-07 DIAGNOSIS — R7303 Prediabetes: Secondary | ICD-10-CM

## 2021-03-07 DIAGNOSIS — E8881 Metabolic syndrome: Secondary | ICD-10-CM

## 2021-03-07 DIAGNOSIS — Z1231 Encounter for screening mammogram for malignant neoplasm of breast: Secondary | ICD-10-CM

## 2021-03-07 DIAGNOSIS — E559 Vitamin D deficiency, unspecified: Secondary | ICD-10-CM

## 2021-03-07 DIAGNOSIS — E785 Hyperlipidemia, unspecified: Secondary | ICD-10-CM

## 2021-03-13 ENCOUNTER — Other Ambulatory Visit (HOSPITAL_COMMUNITY)
Admission: RE | Admit: 2021-03-13 | Discharge: 2021-03-13 | Disposition: A | Discharge status: Home / Self Care | Payer: BC Managed Care – PPO | Source: Ambulatory Visit | Attending: Family Medicine | Admitting: Family Medicine

## 2021-03-13 ENCOUNTER — Telehealth (INDEPENDENT_AMBULATORY_CARE_PROVIDER_SITE_OTHER): Payer: BC Managed Care – PPO | Admitting: Licensed Clinical Social Worker

## 2021-03-13 ENCOUNTER — Other Ambulatory Visit: Payer: Self-pay

## 2021-03-13 DIAGNOSIS — E559 Vitamin D deficiency, unspecified: Secondary | ICD-10-CM | POA: Insufficient documentation

## 2021-03-13 DIAGNOSIS — D649 Anemia, unspecified: Secondary | ICD-10-CM | POA: Insufficient documentation

## 2021-03-13 DIAGNOSIS — F4321 Adjustment disorder with depressed mood: Secondary | ICD-10-CM

## 2021-03-13 DIAGNOSIS — E785 Hyperlipidemia, unspecified: Secondary | ICD-10-CM | POA: Insufficient documentation

## 2021-03-13 DIAGNOSIS — E8881 Metabolic syndrome: Secondary | ICD-10-CM | POA: Insufficient documentation

## 2021-03-13 DIAGNOSIS — R7303 Prediabetes: Secondary | ICD-10-CM | POA: Insufficient documentation

## 2021-03-13 DIAGNOSIS — Z1329 Encounter for screening for other suspected endocrine disorder: Secondary | ICD-10-CM | POA: Insufficient documentation

## 2021-03-14 ENCOUNTER — Telehealth: Payer: Self-pay | Admitting: Licensed Clinical Social Worker

## 2021-03-14 ENCOUNTER — Telehealth (INDEPENDENT_AMBULATORY_CARE_PROVIDER_SITE_OTHER): Payer: BC Managed Care – PPO | Admitting: Licensed Clinical Social Worker

## 2021-03-14 DIAGNOSIS — F4321 Adjustment disorder with depressed mood: Secondary | ICD-10-CM

## 2021-03-14 NOTE — BH Specialist Note (Signed)
[facility] Initial Clinical Assessment  MRN: [id number] NAME: [name] Date: 03/14/21  Start time:  9a End time:  930a Total time:  30 min Call number:  video  Type of Contact:  video Patient consent obtained:  yes Reason for Visit today:  begin [facility] service  Treatment History Patient recently received Inpatient Treatment:    Facility/Program:    Date of discharge:   Patient currently being seen by therapist/psychiatrist:   Patient currently receiving the following services:    Past Psychiatric History/Hospitalization(s): Anxiety: No Bipolar Disorder: No Depression: No Mania: No Psychosis: No Schizophrenia: No Personality Disorder: No Hospitalization for psychiatric illness: No History of Electroconvulsive Shock Therapy: No Prior Suicide Attempts: No  Clinical Assessment:  PHQ-9 Assessments: Depression screen [facility] 2/9 03/06/2021 01/30/2021 12/04/2020  Decreased Interest 1 1 0  Down, Depressed, Hopeless 1 1 0  PHQ - 2 Score 2 2 0  Altered sleeping 2 0 -  Tired, decreased energy 1 3 -  Change in appetite 1 0 -  Feeling bad or failure about yourself  0 0 -  Trouble concentrating 1 1 -  Moving slowly or fidgety/restless 0 - -  Suicidal thoughts 0 0 -  PHQ-9 Score 7 6 -  Difficult doing work/chores Not difficult at all Not difficult at all -  Some recent data might be hidden    GAD-7 Assessments: GAD 7 : Generalized Anxiety Score 03/06/2021 12/04/2020 Jun 19, 2018 12/15/2017  Nervous, Anxious, on Edge 0 1 3 2   Control/stop worrying 1 1 3 2   Worry too much - different things 1 1 3 2   Trouble relaxing 0 1 3 1   Restless 0 1 2 1   Easily annoyed or irritable 0 0 2 2  Afraid - awful might happen 0 0 2 2  Total GAD 7 Score 2 5 18 12   Anxiety Difficulty - - - Somewhat difficult     Social Functioning Social maturity:  WNL Social judgement:  WNL  Stress Current stressors:  bereavement Familial stressors:  bereavement Sleep:  poor Appetite:   poor Coping ability:  ovewhelmed Patient taking medications as prescribed:  yes  Current medications:  Outpatient Encounter Medications as of 03/14/2021  Medication Sig  . [START ON 04/03/2021] busPIRone (BUSPAR) 15 MG tablet Take 0.5 tablets (7.5 mg total) by mouth 3 (three) times daily.  . ergocalciferol (VITAMIN D2) 1.25 MG (50000 UT) capsule Take 1 capsule (50,000 Units total) by mouth once a week. One capsule once weekly  . meloxicam (MOBIC) 7.5 MG tablet Take 1 tablet by mouth once daily  . phentermine (ADIPEX-P) 37.5 MG tablet Take one half tablet by mouth once daily every morning at breakfast  . [START ON 04/03/2021] sertraline (ZOLOFT) 100 MG tablet Take 1 tablet (100 mg total) by mouth daily.   No facility-administered encounter medications on file as of 03/14/2021.    Self-harm Behaviors Risk Assessment Self-harm risk factors:   Patient endorses recent thoughts of harming self:    Suicide Severity Rating Scale:  C-SRSS 2018/06/19  1. Wish to be Dead No  2. Suicidal Thoughts No  6. Suicide Behavior Question No    Danger to Others Risk Assessment Danger to others risk factors:   Patient endorses recent thoughts of harming others:    Dynamic Appraisal of Situational Aggression (DASA): No flowsheet data found.  Substance Use Assessment Patient recently consumed alcohol:    Alcohol Use Disorder Identification Test (AUDIT):  Alcohol Use Disorder Test (AUDIT) 2018/06/19  1.  How often do you have a drink containing alcohol? 0  2. How many drinks containing alcohol do you have on a typical day when you are drinking? 0  3. How often do you have six or more drinks on one occasion? 0  AUDIT-C Score 0  Alcohol Brief Interventions/Follow-up AUDIT Score <7 follow-up not indicated   Patient recently used drugs:    Opioid Risk Assessment:  Patient is concerned about dependence or abuse of substances:    ASAM Multidimensional Assessment Summary:  Dimension 1:    Dimension 1  Rating:    Dimension 2:    Dimension 2 Rating:    Dimension 3:    Dimension 3 Rating:    Dimension 4:    Dimension 4 Rating:    Dimension 5:    Dimension 5 Rating:    Dimension 6:    Dimension 6 Rating:   ASAM's Severity Rating Score:   ASAM Recommended Level of Treatment:     Goals, Interventions and Follow-up Plan Goals: Increase healthy adjustment to current life circumstances Interventions: Motivational Interviewing and Mindfulness or Relaxation Training Follow-up Plan: Weekly VBH sessions  Summary of Clinical Assessment Summary: [name] is a 40 year old woman that was referred by Dr. Lodema Hong due to bereavement.  She reports her mother recently died and she is having a hard time adjusting.She currently struggles with motivation, energy and sleep patterns.  She is somewhat reluctant to begin services but was able to agree to session on next Tuesday at 9a.   Marinda Elk, LCSW

## 2021-03-14 NOTE — BH Specialist Note (Signed)
Virtual Behavioral Health Treatment Plan Team Note  MRN: [id number] NAME: [name]  DATE: 03/14/21  Start time:  338p End time:  349p Total time:  11 min  Total number of Virtual BH Treatment Team Plan encounters: 1/4  Treatment Team Attendees: Nolon Rod, LCSW; Dr. Vanetta Shawl Psychiatrist; Norton Blizzard, LCSW  Diagnoses: No diagnosis found.  Goals, Interventions and Follow-up Plan Goals: Increase healthy adjustment to current life circumstances Interventions: Motivational Interviewing Mindfulness or Relaxation Training Medication Management Recommendations: n/a; she has an appointment with Dr. Vanetta Shawl in July 2022 Follow-up Plan: Weekly VBH sessions  History of the present illness Presenting Problem/Current Symptoms: Patient has been diagnosed with depression currently bereavement as her mother recently passed away.  Psychiatric History  Depression: Yes Anxiety: No Mania: No Psychosis: No PTSD symptoms: No  Past Psychiatric History/Hospitalization(s): Hospitalization for psychiatric illness: No Prior Suicide Attempts: No Prior Self-injurious behavior: No  Psychosocial stressors Flowsheet Row Virtual BH Phone Follow Up from 06/08/2018 in [facility]  Current Stressors --  [mother has been hospitalized 4 times in the last 2 months]  Familial Stressors None  Sleep No problems  Appetite No problems  Coping ability Exhausted, Overwhelmed  Patient taking medications as prescribed Yes       Self-harm Behaviors Risk Assessment Flowsheet Row Virtual BH Phone Follow Up from 06/08/2018 in [facility]  Self-harm risk factors --  [None Reported]       Screenings PHQ-9 Assessments:  Depression screen [facility] 2/9 03/06/2021 01/30/2021 12/04/2020  Decreased Interest 1 1 0  Down, Depressed, Hopeless 1 1 0  PHQ - 2 Score 2 2 0  Altered sleeping 2 0 -  Tired, decreased energy 1 3 -  Change in appetite 1 0 -  Feeling bad or failure about yourself   0 0 -  Trouble concentrating 1 1 -  Moving slowly or fidgety/restless 0 - -  Suicidal thoughts 0 0 -  PHQ-9 Score 7 6 -  Difficult doing work/chores Not difficult at all Not difficult at all -  Some recent data might be hidden   GAD-7 Assessments:  GAD 7 : Generalized Anxiety Score 03/06/2021 12/04/2020 06/08/2018 12/15/2017  Nervous, Anxious, on Edge 0 1 3 2   Control/stop worrying 1 1 3 2   Worry too much - different things 1 1 3 2   Trouble relaxing 0 1 3 1   Restless 0 1 2 1   Easily annoyed or irritable 0 0 2 2  Afraid - awful might happen 0 0 2 2  Total GAD 7 Score 2 5 18 12   Anxiety Difficulty - - - Somewhat difficult    Past Medical History Past Medical History:  Diagnosis Date   Morbid obesity (HCC)     Vital signs: There were no vitals filed for this visit.  Allergies:  Allergies as of 03/14/2021 - Review Complete 03/06/2021  Allergen Reaction Noted   Hydrocodone Itching 12/27/2009   Penicillins Hives 12/27/2009    Medication History Current medications:  Outpatient Encounter Medications as of 03/14/2021  Medication Sig   [START ON 04/03/2021] busPIRone (BUSPAR) 15 MG tablet Take 0.5 tablets (7.5 mg total) by mouth 3 (three) times daily.   ergocalciferol (VITAMIN D2) 1.25 MG (50000 UT) capsule Take 1 capsule (50,000 Units total) by mouth once a week. One capsule once weekly   meloxicam (MOBIC) 7.5 MG tablet Take 1 tablet by mouth once daily   phentermine (ADIPEX-P) 37.5 MG tablet Take one half tablet by mouth once daily every morning  at breakfast   [START ON 04/03/2021] sertraline (ZOLOFT) 100 MG tablet Take 1 tablet (100 mg total) by mouth daily.   No facility-administered encounter medications on file as of 03/14/2021.     Scribe for Treatment Team: Marinda Elk, LCSW

## 2021-03-14 NOTE — Progress Notes (Signed)
Virtual behavioral Health Initiative (vBHI) Psychiatric Consultant Case Review   [name] is a 40 y.o. year old female with a history of depression. Referred for worsening in depression in the context of grief of loss of her mother.   Assessment/Provisional Diagnosis # MDD, recurrent Will continue current medication at this time given her mood symptoms could be situational. Will consider adjustment if no improvement in her symptoms after starting weekly vbh therapy.    Recommendation - No change in her medication (she sees this clinician) - VBH therapist to provide weekly supportive therapy    Thank you for your consult. We will continue to follow the patient. Please contact vBHI  for any questions or concerns.   The above treatment considerations and suggestions are based on consultation with the [facility] specialist and/or PCP and a review of information available in the shared registry and the patient's Electronic Health Record (EHR). I have not personally examined the patient. All recommendations should be implemented with consideration of the patient's relevant prior history and current clinical status. Please feel free to call me with any questions about the care of this patient.

## 2021-03-27 ENCOUNTER — Other Ambulatory Visit: Payer: Self-pay

## 2021-03-27 ENCOUNTER — Telehealth (INDEPENDENT_AMBULATORY_CARE_PROVIDER_SITE_OTHER): Payer: Self-pay | Admitting: Licensed Clinical Social Worker

## 2021-03-27 DIAGNOSIS — F4321 Adjustment disorder with depressed mood: Secondary | ICD-10-CM

## 2021-03-27 NOTE — Progress Notes (Signed)
No show

## 2021-04-09 ENCOUNTER — Encounter: Payer: Self-pay | Admitting: Neurology

## 2021-04-09 ENCOUNTER — Institutional Professional Consult (permissible substitution): Payer: BC Managed Care – PPO | Admitting: Neurology

## 2021-04-09 ENCOUNTER — Telehealth: Payer: Self-pay

## 2021-04-09 NOTE — Telephone Encounter (Signed)
Pt no showed 04/09/21 appt with Dr. Frances Furbish.
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
## 2021-05-01 ENCOUNTER — Other Ambulatory Visit: Payer: Self-pay

## 2021-05-01 ENCOUNTER — Telehealth (INDEPENDENT_AMBULATORY_CARE_PROVIDER_SITE_OTHER): Payer: Self-pay | Admitting: Psychiatry

## 2021-05-01 ENCOUNTER — Encounter: Payer: Self-pay | Admitting: Psychiatry

## 2021-05-01 DIAGNOSIS — F411 Generalized anxiety disorder: Secondary | ICD-10-CM

## 2021-05-01 DIAGNOSIS — F330 Major depressive disorder, recurrent, mild: Secondary | ICD-10-CM

## 2021-05-01 NOTE — Patient Instructions (Signed)
1. Increase sertraline 150 mg daily 2. Continue Buspar 7.5 mg three times a day 3. Next appointment: 9/8 at 1:40

## 2021-05-09 ENCOUNTER — Telehealth: Payer: Self-pay | Admitting: Orthopaedic Surgery

## 2021-05-09 DIAGNOSIS — M1710 Unilateral primary osteoarthritis, unspecified knee: Secondary | ICD-10-CM

## 2021-05-09 DIAGNOSIS — M76821 Posterior tibial tendinitis, right leg: Secondary | ICD-10-CM

## 2021-06-10 ENCOUNTER — Telehealth: Payer: Self-pay | Admitting: Orthopaedic Surgery

## 2021-06-10 DIAGNOSIS — M1710 Unilateral primary osteoarthritis, unspecified knee: Secondary | ICD-10-CM

## 2021-06-10 DIAGNOSIS — M76821 Posterior tibial tendinitis, right leg: Secondary | ICD-10-CM
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
## 2021-06-21 ENCOUNTER — Other Ambulatory Visit: Payer: Self-pay

## 2021-06-21 ENCOUNTER — Telehealth (INDEPENDENT_AMBULATORY_CARE_PROVIDER_SITE_OTHER): Payer: Self-pay | Admitting: Psychiatry

## 2021-06-21 ENCOUNTER — Encounter: Payer: Self-pay | Admitting: Psychiatry

## 2021-06-21 DIAGNOSIS — F411 Generalized anxiety disorder: Secondary | ICD-10-CM

## 2021-06-21 DIAGNOSIS — F3341 Major depressive disorder, recurrent, in partial remission: Secondary | ICD-10-CM

## 2021-06-21 NOTE — Patient Instructions (Signed)
1.  Continue sertraline 150 mg daily 2. Continue Buspar 7.5 mg three times a day 3. Next appointment: 12/6 at 1:40

## 2021-07-30 ENCOUNTER — Ambulatory Visit: Payer: BC Managed Care – PPO | Admitting: Internal Medicine

## 2021-07-30 ENCOUNTER — Encounter: Payer: Self-pay | Admitting: Family Medicine

## 2021-07-30 ENCOUNTER — Encounter: Payer: Self-pay | Admitting: Internal Medicine

## 2021-07-30 ENCOUNTER — Other Ambulatory Visit: Payer: Self-pay

## 2021-07-30 DIAGNOSIS — M7989 Other specified soft tissue disorders: Secondary | ICD-10-CM | POA: Diagnosis not present

## 2021-07-30 MED ORDER — FUROSEMIDE 20 MG PO TABS: 20.0000 mg | ORAL_TABLET | Freq: Every day | ORAL | 0 refills | Status: DC

## 2021-07-30 NOTE — Progress Notes (Signed)
Virtual Visit via Telephone Note   This visit type was conducted due to national recommendations for restrictions regarding the COVID-19 Pandemic (e.g. social distancing) in an effort to limit this patient's exposure and mitigate transmission in our community.  Due to her co-morbid illnesses, this patient is at least at moderate risk for complications without adequate follow up.  This format is felt to be most appropriate for this patient at this time.  The patient did not have access to video technology/had technical difficulties with video requiring transitioning to audio format only (telephone).  All issues noted in this document were discussed and addressed.  No physical exam could be performed with this format.  Evaluation Performed:  Follow-up visit  Date:  07/30/2021   ID:  [name], DOB Apr 24, 1981, MRN [id number]  Patient Location: Home Provider Location: Office/Clinic  Participants: Patient Location of Patient: Home Location of Provider: Telehealth Consent was obtain for visit to be over via telehealth. I verified that I am speaking with the correct person using two identifiers.  PCP:  Kerri Perches, MD   Chief Complaint:  Leg swelling  History of Present Illness:    [name] is a 40 y.o. female who has a televisit for c/o b/l leg swelling for last 3 days. She has tried leg elevation and her right leg swelling has improved, but left leg swelling is still there.  She has sent pictures of leg swelling through MyChart, which appears to be leg edema due to venous insufficiency.  Denies any history of DVT.  Denies any prolonged immobilization.  Denies any recent injury.  Denies any recent change in medication.  The patient does not have symptoms concerning for COVID-19 infection (fever, chills, cough, or new shortness of breath).   Past Medical, Surgical, Social History, Allergies, and Medications have been Reviewed.  Past Medical History:   Diagnosis Date   Morbid obesity (HCC)    Past Surgical History:  Procedure Laterality Date   CESAREAN SECTION     x 3   TUBAL LIGATION       Current Meds  Medication Sig   busPIRone (BUSPAR) 15 MG tablet Take 0.5 tablets (7.5 mg total) by mouth 3 (three) times daily.   ergocalciferol (VITAMIN D2) 1.25 MG (50000 UT) capsule Take 1 capsule (50,000 Units total) by mouth once a week. One capsule once weekly   meloxicam (MOBIC) 7.5 MG tablet Take 1 tablet by mouth once daily   phentermine (ADIPEX-P) 37.5 MG tablet Take one half tablet by mouth once daily every morning at breakfast   sertraline (ZOLOFT) 100 MG tablet Take 1 tablet (100 mg total) by mouth daily.     Allergies:   Hydrocodone and Penicillins   ROS:   Please see the history of present illness.     All other systems reviewed and are negative.   Labs/Other Tests and Data Reviewed:    Recent Labs: 12/06/2020: Hemoglobin 11.3; Platelets 378   Recent Lipid Panel Lab Results  Component Value Date/Time   CHOL 157 05/02/2020 09:50 AM   TRIG 76 05/02/2020 09:50 AM   HDL 42 05/02/2020 09:50 AM   CHOLHDL 3.7 05/02/2020 09:50 AM   CHOLHDL 3.4 07/26/2019 11:18 AM   LDLCALC 100 (H) 05/02/2020 09:50 AM   LDLCALC 93 07/26/2019 11:18 AM    Wt Readings from Last 3 Encounters:  03/06/21 (!) 311 lb (141.1 kg)  12/04/20 (!) 308 lb (139.7 kg)  05/01/20 (!) 306 lb (138.8 kg)  ASSESSMENT & PLAN:    Leg swelling Appears to be due to chronic venous insufficiency Needs to follow low-salt diet Leg elevation and compression socks Lasix as needed    Time:   Today, I have spent 9 minutes reviewing the chart, including problem list, medications, and with the patient with telehealth technology discussing the above problems.   Medication Adjustments/Labs and Tests Ordered: Current medicines are reviewed at length with the patient today.  Concerns regarding medicines are outlined above.   Tests Ordered: No orders of the  defined types were placed in this encounter.   Medication Changes: No orders of the defined types were placed in this encounter.    Note: This dictation was prepared with Dragon dictation along with smaller phrase technology. Similar sounding words can be transcribed inadequately or may not be corrected upon review. Any transcriptional errors that result from this process are unintentional.      Disposition:  Follow up  Signed, Anabel Halon, MD  07/30/2021 12:25 PM     [facility]

## 2021-07-30 NOTE — Patient Instructions (Signed)
Please use compression socks for leg swelling.  Avoid prolonged standing.  Take Lasix for persistent swelling.

## 2021-07-30 NOTE — Assessment & Plan Note (Addendum)
Appears to be due to chronic venous insufficiency Needs to follow low-salt diet Leg elevation and compression socks Lasix as needed

## 2021-08-15 ENCOUNTER — Telehealth: Payer: BC Managed Care – PPO | Admitting: Physician Assistant

## 2021-08-15 DIAGNOSIS — J111 Influenza due to unidentified influenza virus with other respiratory manifestations: Secondary | ICD-10-CM | POA: Diagnosis not present

## 2021-08-15 MED ORDER — OSELTAMIVIR PHOSPHATE 75 MG PO CAPS: 75.0000 mg | ORAL_CAPSULE | Freq: Two times a day (BID) | ORAL | 0 refills | Status: DC

## 2021-08-15 NOTE — Patient Instructions (Signed)
  [name], thank you for joining Piedad Climes, PA-C for today's virtual visit.  While this provider is not your primary care provider (PCP), if your PCP is located in our provider database this encounter information will be shared with them immediately following your visit.  Consent: (Patient) [name] provided verbal consent for this virtual visit at the beginning of the encounter.  Current Medications:  Current Outpatient Medications:    busPIRone (BUSPAR) 15 MG tablet, Take 0.5 tablets (7.5 mg total) by mouth 3 (three) times daily., Disp: 135 tablet, Rfl: 1   ergocalciferol (VITAMIN D2) 1.25 MG (50000 UT) capsule, Take 1 capsule (50,000 Units total) by mouth once a week. One capsule once weekly, Disp: 12 capsule, Rfl: 2   furosemide (LASIX) 20 MG tablet, Take 1 tablet (20 mg total) by mouth daily., Disp: 30 tablet, Rfl: 0   meloxicam (MOBIC) 7.5 MG tablet, Take 1 tablet by mouth once daily, Disp: 30 tablet, Rfl: 5   phentermine (ADIPEX-P) 37.5 MG tablet, Take one half tablet by mouth once daily every morning at breakfast, Disp: 30 tablet, Rfl: 0   sertraline (ZOLOFT) 100 MG tablet, Take 1 tablet (100 mg total) by mouth daily., Disp: 90 tablet, Rfl: 1   Medications ordered in this encounter:  No orders of the defined types were placed in this encounter.    *If you need refills on other medications prior to your next appointment, please contact your pharmacy*  Follow-Up: Call back or seek an in-person evaluation if the symptoms worsen or if the condition fails to improve as anticipated.  Other Instructions Please keep well-hydrated and try to get plenty of rest. Take the Tamiflu as directed. Ok to continue your once-daily Meloxicam, using tylenol as needed later in day for fever, aches.  Start saline nasal rinse and salt-water gargles.  If you have a humidifier, place in the bedroom and run at night.  Start Mucinex-DM for any cough and congestion.  You  need to stay home until fever free for 24 hours without medication and feeling better.  If anything acutely worsens you need an in-person evaluation.    If you have been instructed to have an in-person evaluation today at a local Urgent Care facility, please use the link below. It will take you to a list of all of our available [facility], including address, phone number and hours of operation. Please do not delay care.  [facility]  If you or a family member do not have a primary care provider, use the link below to schedule a visit and establish care. When you choose a [facility] primary care physician or advanced practice provider, you gain a long-term partner in health. Find a Primary Care Provider  Learn more about [facility]'s in-office and virtual care options: [facility] - Get Care Now

## 2021-08-15 NOTE — Progress Notes (Signed)
Virtual Visit Consent   [name], you are scheduled for a virtual visit with a [facility] provider today.     Just as with appointments in the office, your consent must be obtained to participate.  Your consent will be active for this visit and any virtual visit you may have with one of our providers in the next 365 days.     If you have a MyChart account, a copy of this consent can be sent to you electronically.  All virtual visits are billed to your insurance company just like a traditional visit in the office.    As this is a virtual visit, video technology does not allow for your provider to perform a traditional examination.  This may limit your provider's ability to fully assess your condition.  If your provider identifies any concerns that need to be evaluated in person or the need to arrange testing (such as labs, EKG, etc.), we will make arrangements to do so.     Although advances in technology are sophisticated, we cannot ensure that it will always work on either your end or our end.  If the connection with a video visit is poor, the visit may have to be switched to a telephone visit.  With either a video or telephone visit, we are not always able to ensure that we have a secure connection.     I need to obtain your verbal consent now.   Are you willing to proceed with your visit today?    [name] has provided verbal consent on 08/15/2021 for a virtual visit (video or telephone).   Piedad Climes, [place]   Date: 08/15/2021 2:55 PM   Virtual Visit via Video Note   I, Piedad Climes, connected with  [name]  ([id number], 40/24/1982) on 08/15/21 at  2:45 PM EDT by a video-enabled telemedicine application and verified that I am speaking with the correct person using two identifiers.  Location: Patient: Virtual Visit Location Patient: Home Provider: Virtual Visit Location Provider: Home Office   I discussed the limitations of evaluation and  management by telemedicine and the availability of in person appointments. The patient expressed understanding and agreed to proceed.    History of Present Illness: [name] is a 40 y.o. who identifies as a female who was assigned female at birth, and is being seen today for possible flu. Patient endorse symptoms starting yesterday with headache, fever (Tmax 102), body aches, fatigue, chills, sore throat. Endorses some cough and chest congestion but milder. Is noting head/nasal congestion.  Denies recent travel. Son sick with the flu-- confirmed by testing at pediatrician. Has taken Tylenol OTC to help with symptoms.  HPI: HPI  Problems:  Patient Active Problem List   Diagnosis Date Noted   Grief 03/06/2021   Anemia 05/06/2020   Ankle pain, right 07/26/2019   Left anterior knee pain 11/18/2016   Vitamin D deficiency 08/05/2016   Dyspepsia 10/30/2015   Leg swelling 05/08/2015   Metabolic syndrome X 03/21/2014   Dyslipidemia 03/21/2014   GAD (generalized anxiety disorder) 03/03/2014   Knee pain, right 07/23/2013   Prediabetes 05/20/2012   MORBID OBESITY 12/27/2009   ALLERGIC RHINITIS CAUSE UNSPECIFIED 12/27/2009    Allergies:  Allergies  Allergen Reactions   Hydrocodone Itching   Penicillins Hives   Medications:  Current Outpatient Medications:    busPIRone (BUSPAR) 15 MG tablet, Take 0.5 tablets (7.5 mg total) by mouth 3 (three) times daily., Disp: 135  tablet, Rfl: 1   ergocalciferol (VITAMIN D2) 1.25 MG (50000 UT) capsule, Take 1 capsule (50,000 Units total) by mouth once a week. One capsule once weekly, Disp: 12 capsule, Rfl: 2   furosemide (LASIX) 20 MG tablet, Take 1 tablet (20 mg total) by mouth daily., Disp: 30 tablet, Rfl: 0   meloxicam (MOBIC) 7.5 MG tablet, Take 1 tablet by mouth once daily, Disp: 30 tablet, Rfl: 5   oseltamivir (TAMIFLU) 75 MG capsule, Take 1 capsule (75 mg total) by mouth 2 (two) times daily., Disp: 10 capsule, Rfl: 0   phentermine (ADIPEX-P)  37.5 MG tablet, Take one half tablet by mouth once daily every morning at breakfast, Disp: 30 tablet, Rfl: 0   sertraline (ZOLOFT) 100 MG tablet, Take 1 tablet (100 mg total) by mouth daily., Disp: 90 tablet, Rfl: 1  Observations/Objective: Patient is well-developed, well-nourished in no acute distress.  Resting comfortably at home.  Head is normocephalic, atraumatic.  No labored breathing. Speech is clear and coherent with logical content.  Patient is alert and oriented at baseline.   Assessment and Plan: 1. Influenza - oseltamivir (TAMIFLU) 75 MG capsule; Take 1 capsule (75 mg total) by mouth 2 (two) times daily.  Dispense: 10 capsule; Refill: 0 Known exposure. Classic symptoms. Thankfully no alarm signs/symptoms at present. Start Tamiflu after discussion of risk/benefit occurred. Supportive measures, vitamin recommendations and OTC medications discussed. Strict ER precautions reviewed with patient.   Follow Up Instructions: I discussed the assessment and treatment plan with the patient. The patient was provided an opportunity to ask questions and all were answered. The patient agreed with the plan and demonstrated an understanding of the instructions.  A copy of instructions were sent to the patient via MyChart unless otherwise noted below.   The patient was advised to call back or seek an in-person evaluation if the symptoms worsen or if the condition fails to improve as anticipated.  Time:  I spent 12 minutes with the patient via telehealth technology discussing the above problems/concerns.    Piedad Climes, PA-C

## 2021-08-23 ENCOUNTER — Telehealth: Payer: Self-pay

## 2021-08-23 NOTE — Telephone Encounter (Signed)
FMLA forms  (patient spoke to Telehealth on 08/14/21 and put her out of work for the flu 08/14/21 - 08/20/21, patient returned to work on 08/21/21.  Contacted Telehealth and spoke to [place], Telehealth does not fill out any FMLA forms, must go through primary care.   Noted Copied Sleeved

## 2021-08-24 ENCOUNTER — Other Ambulatory Visit: Payer: Self-pay

## 2021-08-24 ENCOUNTER — Ambulatory Visit: Payer: BC Managed Care – PPO | Admitting: Internal Medicine

## 2021-08-24 ENCOUNTER — Encounter: Payer: Self-pay | Admitting: Internal Medicine

## 2021-08-24 ENCOUNTER — Encounter: Payer: Self-pay | Admitting: Family Medicine

## 2021-08-24 DIAGNOSIS — J111 Influenza due to unidentified influenza virus with other respiratory manifestations: Secondary | ICD-10-CM

## 2021-08-24 MED ORDER — BENZONATATE 200 MG PO CAPS: 200.0000 mg | ORAL_CAPSULE | Freq: Two times a day (BID) | ORAL | 0 refills | Status: DC | PRN

## 2021-08-24 NOTE — Progress Notes (Signed)
Virtual Visit via Telephone Note   This visit type was conducted due to national recommendations for restrictions regarding the COVID-19 Pandemic (e.g. social distancing) in an effort to limit this patient's exposure and mitigate transmission in our community.  Due to her co-morbid illnesses, this patient is at least at moderate risk for complications without adequate follow up.  This format is felt to be most appropriate for this patient at this time.  The patient did not have access to video technology/had technical difficulties with video requiring transitioning to audio format only (telephone).  All issues noted in this document were discussed and addressed.  No physical exam could be performed with this format.  Evaluation Performed:  Follow-up visit  Date:  08/24/2021   ID:  [name], DOB 24-Nov-1980, MRN [id number]  Patient Location: Home Provider Location: Office/Clinic  Participants: Patient Location of Patient: Home Location of Provider: Telehealth Consent was obtain for visit to be over via telehealth. I verified that I am speaking with the correct person using two identifiers.  PCP:  Kerri Perches, MD   Chief Complaint: Cough  History of Present Illness:    [name] is a 40 y.o. female who has a televisit for complaint of dry cough after she had flu last week, and has completed Tamiflu.  Her son had confirmed flu, and she was given Tamiflu after an evisit.  She denies any fever, chills, dyspnea or wheezing currently.  She has mild fatigue from ongoing cough.  The patient does not have symptoms concerning for COVID-19 infection (fever, chills, cough, or new shortness of breath).   Past Medical, Surgical, Social History, Allergies, and Medications have been Reviewed.  Past Medical History:  Diagnosis Date   Morbid obesity (HCC)    Past Surgical History:  Procedure Laterality Date   CESAREAN SECTION     x 3   TUBAL LIGATION        Current Meds  Medication Sig   benzonatate (TESSALON) 200 MG capsule Take 1 capsule (200 mg total) by mouth 2 (two) times daily as needed for cough.   busPIRone (BUSPAR) 15 MG tablet Take 0.5 tablets (7.5 mg total) by mouth 3 (three) times daily.   ergocalciferol (VITAMIN D2) 1.25 MG (50000 UT) capsule Take 1 capsule (50,000 Units total) by mouth once a week. One capsule once weekly   furosemide (LASIX) 20 MG tablet Take 1 tablet (20 mg total) by mouth daily.   meloxicam (MOBIC) 7.5 MG tablet Take 1 tablet by mouth once daily   oseltamivir (TAMIFLU) 75 MG capsule Take 1 capsule (75 mg total) by mouth 2 (two) times daily.   phentermine (ADIPEX-P) 37.5 MG tablet Take one half tablet by mouth once daily every morning at breakfast   sertraline (ZOLOFT) 100 MG tablet Take 1 tablet (100 mg total) by mouth daily.     Allergies:   Hydrocodone and Penicillins   ROS:   Please see the history of present illness.     All other systems reviewed and are negative.   Labs/Other Tests and Data Reviewed:    Recent Labs: 12/06/2020: Hemoglobin 11.3; Platelets 378   Recent Lipid Panel Lab Results  Component Value Date/Time   CHOL 157 05/02/2020 09:50 AM   TRIG 76 05/02/2020 09:50 AM   HDL 42 05/02/2020 09:50 AM   CHOLHDL 3.7 05/02/2020 09:50 AM   CHOLHDL 3.4 07/26/2019 11:18 AM   LDLCALC 100 (H) 05/02/2020 09:50 AM   LDLCALC 93 07/26/2019  11:18 AM    Wt Readings from Last 3 Encounters:  03/06/21 (!) 311 lb (141.1 kg)  12/04/20 (!) 308 lb (139.7 kg)  05/01/20 (!) 306 lb (138.8 kg)     ASSESSMENT & PLAN:    Influenza infection Postinfectious cough S/p Tamiflu Tessalon as needed for cough Can continue Robitussin PRN for cough Reassured about benign nature of postinfectious cough  Time:   Today, I have spent 8 minutes reviewing the chart, including problem list, medications, and with the patient with telehealth technology discussing the above problems.   Medication Adjustments/Labs  and Tests Ordered: Current medicines are reviewed at length with the patient today.  Concerns regarding medicines are outlined above.   Tests Ordered: No orders of the defined types were placed in this encounter.   Medication Changes: Meds ordered this encounter  Medications   benzonatate (TESSALON) 200 MG capsule    Sig: Take 1 capsule (200 mg total) by mouth 2 (two) times daily as needed for cough.    Dispense:  20 capsule    Refill:  0     Note: This dictation was prepared with Dragon dictation along with smaller phrase technology. Similar sounding words can be transcribed inadequately or may not be corrected upon review. Any transcriptional errors that result from this process are unintentional.      Disposition:  Follow up  Signed, Anabel Halon, MD  08/24/2021 8:42 AM     [facility]

## 2021-08-24 NOTE — Telephone Encounter (Signed)
Pt made a virtual appt for 08-23-21

## 2021-08-27 ENCOUNTER — Encounter: Payer: Self-pay | Admitting: *Deleted

## 2021-09-17 DIAGNOSIS — Z0279 Encounter for issue of other medical certificate: Secondary | ICD-10-CM
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
## 2021-09-18 ENCOUNTER — Other Ambulatory Visit: Payer: Self-pay

## 2021-09-18 ENCOUNTER — Encounter: Payer: Self-pay | Admitting: Psychiatry

## 2021-09-18 ENCOUNTER — Telehealth (INDEPENDENT_AMBULATORY_CARE_PROVIDER_SITE_OTHER): Payer: BC Managed Care – PPO | Admitting: Psychiatry

## 2021-09-18 DIAGNOSIS — F3341 Major depressive disorder, recurrent, in partial remission: Secondary | ICD-10-CM | POA: Diagnosis not present

## 2021-09-18 DIAGNOSIS — F411 Generalized anxiety disorder: Secondary | ICD-10-CM | POA: Diagnosis not present

## 2021-09-18 MED ORDER — BUSPIRONE HCL 15 MG PO TABS: 7.5000 mg | ORAL_TABLET | Freq: Three times a day (TID) | ORAL | 1 refills | Status: AC

## 2021-09-18 MED ORDER — SERTRALINE HCL 100 MG PO TABS
100.0000 mg | ORAL_TABLET | Freq: Every day | ORAL | 0 refills | Status: DC
Start: 2021-10-01 — End: 2022-02-27

## 2021-09-18 NOTE — Patient Instructions (Addendum)
1.  Continue sertraline 150 mg daily 2. Continue Buspar 7.5 mg three times a day 3. Next appointment: 3/6 at 10:30, in person   The next visit will be in person visit. Please arrive 15 mins before the scheduled time.   [facility]  Address: [address], [place], [place] [address]

## 2021-09-18 NOTE — Progress Notes (Signed)
error 

## 2021-09-18 NOTE — Telephone Encounter (Signed)
Faxed FMLA forms [phone], paid over phone for forms.

## 2021-12-17 ENCOUNTER — Ambulatory Visit: Payer: BC Managed Care – PPO | Admitting: Psychiatry

## 2022-02-12 ENCOUNTER — Other Ambulatory Visit: Payer: Self-pay

## 2022-02-12 ENCOUNTER — Encounter: Payer: Self-pay | Admitting: Family Medicine

## 2022-02-12 DIAGNOSIS — R7303 Prediabetes: Secondary | ICD-10-CM

## 2022-02-12 DIAGNOSIS — E559 Vitamin D deficiency, unspecified: Secondary | ICD-10-CM

## 2022-02-12 DIAGNOSIS — Z1329 Encounter for screening for other suspected endocrine disorder: Secondary | ICD-10-CM

## 2022-02-12 DIAGNOSIS — E785 Hyperlipidemia, unspecified: Secondary | ICD-10-CM

## 2022-02-12 NOTE — Telephone Encounter (Signed)
Labs ordered patient aware

## 2022-02-27 ENCOUNTER — Telehealth (INDEPENDENT_AMBULATORY_CARE_PROVIDER_SITE_OTHER): Payer: BC Managed Care – PPO | Admitting: Psychiatry

## 2022-02-27 ENCOUNTER — Encounter: Payer: Self-pay | Admitting: Psychiatry

## 2022-02-27 ENCOUNTER — Other Ambulatory Visit: Payer: Self-pay | Admitting: Psychiatry

## 2022-02-27 DIAGNOSIS — F411 Generalized anxiety disorder: Secondary | ICD-10-CM

## 2022-02-27 DIAGNOSIS — F3341 Major depressive disorder, recurrent, in partial remission: Secondary | ICD-10-CM | POA: Diagnosis not present

## 2022-02-27 MED ORDER — SERTRALINE HCL 100 MG PO TABS: 100.0000 mg | ORAL_TABLET | Freq: Every day | ORAL | 0 refills | Status: DC

## 2022-02-27 NOTE — Patient Instructions (Addendum)
1.  Continue sertraline 150 mg daily ?2. Continue Buspar 7.5 mg three times a day ?3. Next appointment: 7/20 at 11 AM, in person ? ?The next visit will be in person visit. Please arrive 15 mins before the scheduled time.  ? ?[facility]  ?Address: [address], [place], [place] [address]   ?

## 2022-02-27 NOTE — Telephone Encounter (Signed)
Declined a refill of sertraline at this time as she has not been seen since last year. Please contact the patient and make a follow up appointment.  ?

## 2022-02-27 NOTE — Progress Notes (Signed)
Virtual Visit via Video Note ? ?I connected with [name] on 02/27/22 at 11:30 AM EDT by a video enabled telemedicine application and verified that I am speaking with the correct person using two identifiers. ? ?Location: ?Patient: work ?Provider: office ?Persons participated in the visit- patient, provider  ?  ?I discussed the limitations of evaluation and management by telemedicine and the availability of in person appointments. The patient expressed understanding and agreed to proceed. ? ?  ?I discussed the assessment and treatment plan with the patient. The patient was provided an opportunity to ask questions and all were answered. The patient agreed with the plan and demonstrated an understanding of the instructions. ?  ?The patient was advised to call back or seek an in-person evaluation if the symptoms worsen or if the condition fails to improve as anticipated. ? ?I provided 16 minutes of non-face-to-face time during this encounter. ? ? ?Norman Clay, MD ? ? ? ?BH MD/PA/NP OP Progress Note ? ?02/27/2022 11:57 AM ?[name]  ?MRN:  [id number] ? ?Chief Complaint:  ?Chief Complaint  ?Patient presents with  ? Follow-up  ? Depression  ? ?HPI:  ?This is a follow-up appointment for depression.  ?She states that she has been doing well.  She has a granddaughter, who was born in March.  Her granddaughter was born with CMV.  She is not fostering a child anymore as she wants to make sure to take care of her immediate family.  She also misses her mother, who passed away last year, although it has been getting better.  The child is with his siblings, and she feels content with the situation.  She was missing/forgetting to take medication around the birth of her granddaughter.  Although she was struggling with anxiety, it has been getting better since being back on the medication.  The work is going good.  She has middle insomnia.  She has fair energy.  She denies change in appetite or weight.  She  denies feeling depressed.  She denies SI.  She denies panic attacks or irritability.  She has an upcoming appointment with her primary care.  ? ? ?Wt Readings from Last 3 Encounters:  ?03/06/21 (!) 311 lb (141.1 kg)  ?12/04/20 (!) 308 lb (139.7 kg)  ?05/01/20 (!) 306 lb (138.8 kg)  ?  ? 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  ?  ? ? ? ? ? ? ?Visit Diagnosis:  ?  ICD-10-CM   ?1. MDD (major depressive disorder), recurrent, in partial remission (HCC)  F33.41 sertraline (ZOLOFT) 100 MG tablet  ?  ?2. GAD (generalized anxiety disorder)  F41.1   ?  ? ? ?Past Psychiatric History: Please see initial evaluation for full details. I have reviewed the history. No updates at this time.  ?  ? ?Past Medical History:  ?Past Medical History:  ?Diagnosis Date  ? Morbid obesity ([place])   ?  ?Past Surgical History:  ?Procedure Laterality Date  ? CESAREAN SECTION    ? x 3  ? TUBAL LIGATION    ? ? ?Family Psychiatric History: Please see initial evaluation for full details. I have reviewed the history. No updates at this time.  ?  ? ?Family History:  ?Family History  ?Problem Relation Age of Onset  ? Heart disease Mother   ?  Asthma Mother   ? Hypertension Mother   ? Sleep apnea Mother   ? Anxiety disorder Mother   ? Diabetes Brother   ? Sleep apnea Father   ? Anxiety disorder Father   ? ? ?Social History:  ?Social History  ? ?Socioeconomic History  ? Marital status: Married  ?  Spouse name: Not on file  ? Number of children: Not on file  ? Years of education: Not on file  ? Highest education level: Not on file  ?Occupational History  ? Not on file  ?Tobacco Use  ? Smoking status: Never  ? Smokeless tobacco: Never  ?Vaping Use  ? Vaping Use: Never used  ?Substance and Sexual Activity  ? Alcohol use: No  ? Drug use: No  ? Sexual  activity: Not on file  ?Other Topics Concern  ? Not on file  ?Social History Narrative  ? Not on file  ? ?Social Determinants of Health  ? ?Financial Resource Strain: Not on file  ?Food Insecurity: Not on file  ?Transportation Needs: Not on file  ?Physical Activity: Not on file  ?Stress: Not on file  ?Social Connections: Not on file  ? ? ?Allergies:  ?Allergies  ?Allergen Reactions  ? Hydrocodone Itching  ? Penicillins Hives  ? ? ?Metabolic Disorder Labs: ?Lab Results  ?Component Value Date  ? HGBA1C 5.9 (H) 12/06/2020  ? MPG 128 07/26/2019  ? MPG 120 06/08/2018  ? ?No results found for: PROLACTIN ?Lab Results  ?Component Value Date  ? CHOL 157 05/02/2020  ? TRIG 76 05/02/2020  ? HDL 42 05/02/2020  ? CHOLHDL 3.7 05/02/2020  ? VLDL 14 07/13/2016  ? LDLCALC 100 (H) 05/02/2020  ? [place] 93 07/26/2019  ? ?Lab Results  ?Component Value Date  ? TSH 1.240 05/02/2020  ? TSH 0.83 07/26/2019  ? ? ?Therapeutic Level Labs: ?No results found for: LITHIUM ?No results found for: VALPROATE ?No components found for:  CBMZ ? ?Current Medications: ?Current Outpatient Medications  ?Medication Sig Dispense Refill  ? benzonatate (TESSALON) 200 MG capsule Take 1 capsule (200 mg total) by mouth 2 (two) times daily as needed for cough. 20 capsule 0  ? busPIRone (BUSPAR) 15 MG tablet Take 0.5 tablets (7.5 mg total) by mouth 3 (three) times daily. 135 tablet 1  ? ergocalciferol (VITAMIN D2) 1.25 MG (50000 UT) capsule Take 1 capsule (50,000 Units total) by mouth once a week. One capsule once weekly 12 capsule 2  ? furosemide (LASIX) 20 MG tablet Take 1 tablet (20 mg total) by mouth daily. 30 tablet 0  ? meloxicam (MOBIC) 7.5 MG tablet Take 1 tablet by mouth once daily 30 tablet 5  ? oseltamivir (TAMIFLU) 75 MG capsule Take 1 capsule (75 mg total) by mouth 2 (two) times daily. 10 capsule 0  ? phentermine (ADIPEX-P) 37.5 MG tablet Take one half tablet by mouth once daily every morning at breakfast 30 tablet 0  ? sertraline (ZOLOFT) 100 MG  tablet Take 1 tablet (100 mg total) by mouth daily. 90 tablet 0  ? ?No current facility-administered medications for this visit.  ? ? ? ?Musculoskeletal: ?Strength & Muscle Tone:  N/A ?Gait & Station:  N/A ?Patient leans: N/A ? ?Psychiatric Specialty Exam: ?Review of Systems  ?Psychiatric/Behavioral:  Positive for dysphoric mood and sleep disturbance. Negative for agitation, behavioral problems, confusion, decreased concentration, hallucinations, self-injury and suicidal ideas. The patient is nervous/anxious. The patient is not hyperactive.   ?All other systems reviewed and are negative.  ?There were  no vitals taken for this visit.There is no height or weight on file to calculate BMI.  ?General Appearance: Fairly Groomed  ?Eye Contact:  Good  ?Speech:  Clear and Coherent  ?Volume:  Normal  ?Mood:   good  ?Affect:  Appropriate and Congruent  ?Thought Process:  Coherent  ?Orientation:  Full (Time, Place, and Person)  ?Thought Content: Logical   ?Suicidal Thoughts:  No  ?Homicidal Thoughts:  No  ?Memory:  Immediate;   Good  ?Judgement:  Good  ?Insight:  Good  ?Psychomotor Activity:  Normal  ?Concentration:  Concentration: Good and Attention Span: Good  ?Recall:  Good  ?Fund of Knowledge: Good  ?Language: Good  ?Akathisia:  No  ?Handed:  Right  ?AIMS (if indicated): not done  ?Assets:  Communication Skills ?Desire for Improvement  ?ADL's:  Intact  ?Cognition: WNL  ?Sleep:  Poor  ? ?Screenings: ?GAD-7   ? ?Flowsheet Row Video Visit from 03/06/2021 in [facility] Video Visit from 12/04/2020 in [facility] Office Visit from 06/08/2018 in [facility] Office Visit from 12/15/2017 in [facility] Office Visit from 08/05/2016 in [facility]  ?Total GAD-7 Score 2 5 18 12 3   ? ?  ? ?PHQ2-9   ? ?Flowsheet Row Video Visit from 09/18/2021 in [facility] Office Visit from 08/24/2021 in [facility] Office Visit from 07/30/2021 in [facility] Video Visit from 06/21/2021 in [facility] Video Visit from 03/06/2021 in [facility]  ?PHQ-2 Total Score 1 0 0 0 2  ?PHQ-9 Total Score -- 0 -- -- 7  ? ?  ? ?Fl

## 2022-03-01 ENCOUNTER — Encounter: Payer: BC Managed Care – PPO | Admitting: Family Medicine

## 2022-03-21 ENCOUNTER — Ambulatory Visit: Payer: BC Managed Care – PPO | Admitting: Family Medicine

## 2022-03-21 ENCOUNTER — Encounter: Payer: Self-pay | Admitting: Family Medicine

## 2022-03-21 VITALS — BP 122/82 | HR 80 | Ht 60.0 in | Wt 311.0 lb

## 2022-03-21 DIAGNOSIS — Z1231 Encounter for screening mammogram for malignant neoplasm of breast: Secondary | ICD-10-CM | POA: Diagnosis not present

## 2022-03-21 DIAGNOSIS — E559 Vitamin D deficiency, unspecified: Secondary | ICD-10-CM | POA: Diagnosis not present

## 2022-03-21 DIAGNOSIS — R7303 Prediabetes: Secondary | ICD-10-CM | POA: Diagnosis not present

## 2022-03-21 DIAGNOSIS — F411 Generalized anxiety disorder: Secondary | ICD-10-CM

## 2022-03-21 DIAGNOSIS — E785 Hyperlipidemia, unspecified: Secondary | ICD-10-CM

## 2022-03-21 NOTE — Patient Instructions (Signed)
Annual with pap in 9 weeks, call if you need me sooner  PLease schedule mammogram at checkout  Labs today, cBC, lipid, cmp and EGFr, hBA1C, tSH and vit D  I will prescribe weight loss med after labs are reviewed, and you need to let me know if unable to get med prescribed, as we discussed  It is important that you exercise regularly at least 30 minutes 5 times a week. If you develop chest pain, have severe difficulty breathing, or feel very tired, stop exercising immediately and seek medical attention   Intermittent fasting and plant based eating, with 64 ounce water / day commitment are necessary  Thanks for choosing [facility], we consider it a privelige to serve you.

## 2022-03-22 LAB — CMP14+EGFR
ALT: 14 IU/L (ref 0–32)
AST: 14 IU/L (ref 0–40)
Albumin/Globulin Ratio: 1.2 (ref 1.2–2.2)
Albumin: 3.8 g/dL (ref 3.8–4.8)
Alkaline Phosphatase: 75 IU/L (ref 44–121)
BUN/Creatinine Ratio: 15 (ref 9–23)
BUN: 14 mg/dL (ref 6–24)
Bilirubin Total: 0.5 mg/dL (ref 0.0–1.2)
CO2: 20 mmol/L (ref 20–29)
Calcium: 8.4 mg/dL — ABNORMAL LOW (ref 8.7–10.2)
Chloride: 104 mmol/L (ref 96–106)
Creatinine, Ser: 0.92 mg/dL (ref 0.57–1.00)
Globulin, Total: 3.3 g/dL (ref 1.5–4.5)
Glucose: 104 mg/dL — ABNORMAL HIGH (ref 70–99)
Potassium: 4.2 mmol/L (ref 3.5–5.2)
Sodium: 139 mmol/L (ref 134–144)
Total Protein: 7.1 g/dL (ref 6.0–8.5)
eGFR: 80 mL/min/{1.73_m2} (ref >59)

## 2022-03-22 LAB — CBC
Hematocrit: 37.3 % (ref 34.0–46.6)
Hemoglobin: 11.5 g/dL (ref 11.1–15.9)
MCH: 26.4 pg — ABNORMAL LOW (ref 26.6–33.0)
MCHC: 30.8 g/dL — ABNORMAL LOW (ref 31.5–35.7)
MCV: 86 fL (ref 79–97)
Platelets: 357 10*3/uL (ref 150–450)
RBC: 4.36 x10E6/uL (ref 3.77–5.28)
RDW: 13.9 % (ref 11.7–15.4)
WBC: 7 10*3/uL (ref 3.4–10.8)

## 2022-03-22 LAB — LIPID PANEL
Chol/HDL Ratio: 3.6 ratio (ref 0.0–4.4)
Cholesterol, Total: 154 mg/dL (ref 100–199)
HDL: 43 mg/dL (ref >39)
LDL Chol Calc (NIH): 96 mg/dL (ref 0–99)
Triglycerides: 80 mg/dL (ref 0–149)
VLDL Cholesterol Cal: 15 mg/dL (ref 5–40)

## 2022-03-22 LAB — TSH: TSH: 0.621 u[IU]/mL (ref 0.450–4.500)

## 2022-03-22 LAB — HEMOGLOBIN A1C
Est. average glucose Bld gHb Est-mCnc: 131 mg/dL
Hgb A1c MFr Bld: 6.2 % — ABNORMAL HIGH (ref 4.8–5.6)

## 2022-03-22 LAB — VITAMIN D 25 HYDROXY (VIT D DEFICIENCY, FRACTURES): Vit D, 25-Hydroxy: 18.8 ng/mL — ABNORMAL LOW (ref 30.0–100.0)

## 2022-03-25 ENCOUNTER — Other Ambulatory Visit: Payer: Self-pay | Admitting: Family Medicine

## 2022-03-25 ENCOUNTER — Encounter: Payer: Self-pay | Admitting: Family Medicine

## 2022-03-25 ENCOUNTER — Ambulatory Visit (HOSPITAL_COMMUNITY)
Admission: RE | Admit: 2022-03-25 | Discharge: 2022-03-25 | Disposition: A | Discharge status: Home / Self Care | Payer: BC Managed Care – PPO | Source: Ambulatory Visit | Attending: Family Medicine | Admitting: Family Medicine

## 2022-03-25 DIAGNOSIS — Z1231 Encounter for screening mammogram for malignant neoplasm of breast: Secondary | ICD-10-CM | POA: Diagnosis present

## 2022-03-25 MED ORDER — PHENTERMINE HCL 37.5 MG PO TABS: 37.5000 mg | ORAL_TABLET | Freq: Every day | ORAL | 2 refills | Status: DC

## 2022-03-27 ENCOUNTER — Other Ambulatory Visit (HOSPITAL_COMMUNITY): Payer: Self-pay | Admitting: Family Medicine

## 2022-03-27 DIAGNOSIS — R928 Other abnormal and inconclusive findings on diagnostic imaging of breast: Secondary | ICD-10-CM

## 2022-04-02 ENCOUNTER — Ambulatory Visit (HOSPITAL_COMMUNITY)
Admission: RE | Admit: 2022-04-02 | Discharge: 2022-04-02 | Disposition: A | Discharge status: Home / Self Care | Payer: BC Managed Care – PPO | Source: Ambulatory Visit | Attending: Family Medicine | Admitting: Family Medicine

## 2022-04-02 ENCOUNTER — Other Ambulatory Visit: Payer: Self-pay

## 2022-04-02 DIAGNOSIS — R928 Other abnormal and inconclusive findings on diagnostic imaging of breast: Secondary | ICD-10-CM

## 2022-04-02 DIAGNOSIS — N6320 Unspecified lump in the left breast, unspecified quadrant: Secondary | ICD-10-CM

## 2022-04-03 ENCOUNTER — Other Ambulatory Visit: Payer: Self-pay | Admitting: Family Medicine

## 2022-04-07 ENCOUNTER — Encounter: Payer: Self-pay | Admitting: Family Medicine

## 2022-04-07 NOTE — Assessment & Plan Note (Signed)
uncorrected , needs weekly su[pplement for next 9 months, then re eva;l
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
## 2022-05-02 ENCOUNTER — Ambulatory Visit: Payer: BC Managed Care – PPO | Admitting: Psychiatry
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
## 2022-05-16 ENCOUNTER — Encounter: Payer: Self-pay | Admitting: Psychiatry

## 2022-05-16 ENCOUNTER — Telehealth (INDEPENDENT_AMBULATORY_CARE_PROVIDER_SITE_OTHER): Payer: Self-pay | Admitting: Psychiatry

## 2022-05-16 ENCOUNTER — Telehealth: Payer: Self-pay | Admitting: Psychiatry

## 2022-05-16 DIAGNOSIS — F3341 Major depressive disorder, recurrent, in partial remission: Secondary | ICD-10-CM

## 2022-05-16 DIAGNOSIS — F411 Generalized anxiety disorder: Secondary | ICD-10-CM

## 2022-05-16 MED ORDER — SERTRALINE HCL 100 MG PO TABS: 150.0000 mg | ORAL_TABLET | Freq: Every day | ORAL | 0 refills | Status: DC
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
## 2022-05-23 ENCOUNTER — Ambulatory Visit: Payer: BC Managed Care – PPO | Admitting: Family Medicine

## 2022-05-23 NOTE — Telephone Encounter (Signed)
Completed visit.

## 2022-07-09 ENCOUNTER — Ambulatory Visit: Payer: BC Managed Care – PPO | Admitting: Family Medicine

## 2022-07-19 ENCOUNTER — Ambulatory Visit: Payer: BC Managed Care – PPO | Admitting: Family Medicine

## 2022-07-19 ENCOUNTER — Encounter: Payer: Self-pay | Admitting: Family Medicine
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
## 2022-08-09 ENCOUNTER — Telehealth: Payer: Self-pay | Admitting: Psychiatry
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
## 2022-09-02 ENCOUNTER — Telehealth (INDEPENDENT_AMBULATORY_CARE_PROVIDER_SITE_OTHER): Payer: BC Managed Care – PPO | Admitting: Psychiatry

## 2022-09-02 ENCOUNTER — Encounter: Payer: Self-pay | Admitting: Psychiatry

## 2022-09-02 DIAGNOSIS — F3341 Major depressive disorder, recurrent, in partial remission: Secondary | ICD-10-CM | POA: Diagnosis not present

## 2022-09-02 DIAGNOSIS — F411 Generalized anxiety disorder: Secondary | ICD-10-CM | POA: Diagnosis not present

## 2022-10-08 ENCOUNTER — Ambulatory Visit (HOSPITAL_COMMUNITY): Payer: BC Managed Care – PPO

## 2022-10-08 ENCOUNTER — Encounter (HOSPITAL_COMMUNITY): Payer: BC Managed Care – PPO

## 2022-11-26 ENCOUNTER — Ambulatory Visit: Payer: BC Managed Care – PPO | Admitting: Psychiatry

## 2022-12-12 ENCOUNTER — Encounter: Payer: Self-pay | Admitting: Radiology

## 2022-12-14 ENCOUNTER — Other Ambulatory Visit: Payer: Self-pay | Admitting: Family Medicine

## 2022-12-16 ENCOUNTER — Telehealth: Payer: Self-pay | Admitting: Orthopaedic Surgery

## 2022-12-16 DIAGNOSIS — M1710 Unilateral primary osteoarthritis, unspecified knee: Secondary | ICD-10-CM

## 2022-12-16 DIAGNOSIS — M76821 Posterior tibial tendinitis, right leg: Secondary | ICD-10-CM

## 2022-12-16 MED ORDER — VITAMIN D (ERGOCALCIFEROL) 1.25 MG (50000 UNIT) PO CAPS: 50000.0000 [IU] | ORAL_CAPSULE | ORAL | 0 refills | Status: DC

## 2022-12-19 ENCOUNTER — Encounter: Payer: BC Managed Care – PPO | Admitting: Family Medicine

## 2022-12-31 ENCOUNTER — Ambulatory Visit: Payer: BC Managed Care – PPO | Admitting: Psychiatry

## 2023-01-03 ENCOUNTER — Other Ambulatory Visit (HOSPITAL_COMMUNITY)
Admission: RE | Admit: 2023-01-03 | Discharge: 2023-01-03 | Disposition: A | Discharge status: Home / Self Care | Payer: BC Managed Care – PPO | Source: Ambulatory Visit | Attending: Family Medicine | Admitting: Family Medicine

## 2023-01-03 ENCOUNTER — Encounter: Payer: Self-pay | Admitting: Family Medicine

## 2023-01-03 ENCOUNTER — Ambulatory Visit (INDEPENDENT_AMBULATORY_CARE_PROVIDER_SITE_OTHER): Payer: BC Managed Care – PPO | Admitting: Family Medicine

## 2023-01-03 VITALS — BP 138/84 | HR 85 | Ht 60.0 in | Wt 307.1 lb

## 2023-01-03 DIAGNOSIS — Z124 Encounter for screening for malignant neoplasm of cervix: Secondary | ICD-10-CM

## 2023-01-03 DIAGNOSIS — E559 Vitamin D deficiency, unspecified: Secondary | ICD-10-CM

## 2023-01-03 DIAGNOSIS — Z0001 Encounter for general adult medical examination with abnormal findings: Secondary | ICD-10-CM | POA: Diagnosis not present

## 2023-01-03 DIAGNOSIS — R7303 Prediabetes: Secondary | ICD-10-CM | POA: Diagnosis not present

## 2023-01-03 DIAGNOSIS — E785 Hyperlipidemia, unspecified: Secondary | ICD-10-CM

## 2023-01-03 DIAGNOSIS — Z0000 Encounter for general adult medical examination without abnormal findings: Secondary | ICD-10-CM

## 2023-01-03 DIAGNOSIS — Z1329 Encounter for screening for other suspected endocrine disorder: Secondary | ICD-10-CM

## 2023-01-03 DIAGNOSIS — D649 Anemia, unspecified: Secondary | ICD-10-CM

## 2023-01-03 MED ORDER — WEGOVY 0.25 MG/0.5ML ~~LOC~~ SOAJ: 0.2500 mg | SUBCUTANEOUS | 0 refills | Status: DC

## 2023-01-03 NOTE — Assessment & Plan Note (Signed)
  Patient re-educated about  the importance of commitment to a  minimum of 150 minutes of exercise per week as able.  The importance of healthy food choices with portion control discussed, as well as eating regularly and within a 12 hour window most days. The need to choose "clean , green" food 50 to 75% of the time is discussed, as well as to make water the primary drink and set a goal of 64 ounces water daily.       01/03/2023    8:36 AM 03/21/2022    8:59 AM 03/06/2021    2:50 PM  Weight /BMI  Weight 307 lb 1.9 oz 311 lb 311 lb  Height 5' (1.524 m) 5' (1.524 m) 5' (1.524 m)  BMI 59.98 kg/m2 60.74 kg/m2 60.74 kg/m2    Rx wegovy

## 2023-01-03 NOTE — Assessment & Plan Note (Signed)

## 2023-01-03 NOTE — Assessment & Plan Note (Signed)
Hyperlipidemia:Low fat diet discussed and encouraged.   Lipid Panel  Lab Results  Component Value Date   CHOL 154 03/21/2022   HDL 43 03/21/2022   LDLCALC 96 03/21/2022   TRIG 80 03/21/2022   CHOLHDL 3.6 03/21/2022     Update lab

## 2023-01-03 NOTE — Patient Instructions (Addendum)
F/U in 3 months, call if you need me sooner  Weight loss goal of 10 pounds  Let Psychiatry know you are not taking your medication  Labs today CBC, iron and ferritin, lipid pamel, hBA1C, TSH and vit D, cmp and EGFR  I Am prescribing new med for weight loss, as we discussed very likely that this will not be covered , you will then get the option to start over with phentermine, also keeping bariatric surgery in mind  It is important that you exercise regularly at least 30 minutes 5 times a week. If you develop chest pain, have severe difficulty breathing, or feel very tired, stop exercising immediately and seek medical attention   Think about what you will eat, plan ahead. Choose " clean, green, fresh or frozen" over canned, processed or packaged foods which are more sugary, salty and fatty. 70 to 75% of food eaten should be vegetables and fruit. Three meals at set times with snacks allowed between meals, but they must be fruit or vegetables. Aim to eat over a 12 hour period , example 7 am to 7 pm, and STOP after  your last meal of the day. Drink water,generally about 64 ounces per day, no other drink is as healthy. Fruit juice is best enjoyed in a healthy way, by EATING the fruit.  Thanks for choosing [facility], we consider it a privelige to serve you.

## 2023-01-03 NOTE — Assessment & Plan Note (Signed)
Updated lab

## 2023-01-03 NOTE — Assessment & Plan Note (Signed)
Check CBC and iron

## 2023-01-03 NOTE — Assessment & Plan Note (Signed)
Patient educated about the importance of limiting  Carbohydrate intake , the need to commit to daily physical activity for a minimum of 30 minutes , and to commit weight loss. The fact that changes in all these areas will reduce or eliminate all together the development of diabetes is stressed.      Latest Ref Rng & Units 03/21/2022    9:43 AM 12/06/2020    2:57 PM 05/02/2020    9:50 AM 05/01/2020    4:47 PM 07/26/2019   11:18 AM  Diabetic Labs  HbA1c 4.8 - 5.6 % 6.2  5.9   5.7  6.1   Chol 100 - 199 mg/dL 154   157   154   HDL >39 mg/dL 43   42   45   Calc LDL 0 - 99 mg/dL 96   100   93   Triglycerides 0 - 149 mg/dL 80   76   75   Creatinine 0.57 - 1.00 mg/dL 0.92   0.88   0.97       01/03/2023    8:36 AM 03/21/2022    8:59 AM 03/06/2021    2:50 PM 12/04/2020    3:24 PM 05/01/2020    4:39 PM 12/16/2019    3:21 PM 10/04/2019    9:13 AM  BP/Weight  Systolic BP 0000000 [id number]   0000000 [id number]  Diastolic BP 84 82   83 73 73  Wt. (Lbs) 307.12 311 311 308 306 311 311  BMI 59.98 kg/m2 60.74 kg/m2 60.74 kg/m2 60.15 kg/m2 59.76 kg/m2 60.74 kg/m2 60.74 kg/m2       No data to display          Updated lab needed at/ before next visit.

## 2023-01-03 NOTE — Progress Notes (Signed)
[name]     MRN: [id number]      DOB: [birth year]  HPI: Patient is in for annual physical exam. Weight concerns are addressed Labs drawn today will be reviewed Immunization is reviewed , and  updated if needed.   PE: BP 138/84 (BP Location: Right Arm, Patient Position: Sitting, Cuff Size: Large)   Pulse 85   Ht 5' (1.524 m)   Wt (!) 307 lb 1.9 oz (139.3 kg)   SpO2 95%   BMI 59.98 kg/m   Pleasant  female, alert and oriented x 3, in no cardio-pulmonary distress. Afebrile. HEENT No facial trauma or asymetry. Sinuses non tender.  Extra occullar muscles intact.. External ears normal, . Neck: supple, no adenopathy,JVD or thyromegaly.No bruits.  Chest: Clear to ascultation bilaterally.No crackles or wheezes. Non tender to palpation  Breast: No asymetry,no masses or lumps. No tenderness. No nipple discharge or inversion. No axillary or supraclavicular adenopathy  Cardiovascular system; Heart sounds normal,  S1 and  S2 ,no S3.  No murmur, or thrill. Apical beat not displaced Peripheral pulses normal.  Abdomen: Soft, non tender, no organomegaly or masses. No bruits. Bowel sounds normal. No guarding, tenderness or rebound.   GU: External genitalia normal female genitalia , normal female distribution of hair. No lesions. Urethral meatus normal in size, no  Prolapse, no lesions visibly  Present. Bladder non tender. Vagina pink and moist , with no visible lesions , discharge present . Adequate pelvic support no  cystocele or rectocele noted Cervix pink and appears healthy, no lesions or ulcerations noted, no discharge noted from os Uterus normal size, no adnexal masses, no cervical motion or adnexal tenderness.   Musculoskeletal exam: Full ROM of spine, hips , shoulders and knees. No deformity ,swelling or crepitus noted. No muscle wasting or atrophy.   Neurologic: Cranial nerves 2 to 12 intact. Power, tone ,sensation and reflexes normal throughout. No  disturbance in gait. No tremor.  Skin: Intact, no ulceration, erythema , scaling or rash noted. Pigmentation normal throughout  Psych; Normal mood and affect. Judgement and concentration normal   Assessment & Plan:  Annual physical exam Annual exam as documented. Counseling done  re healthy lifestyle involving commitment to 150 minutes exercise per week, heart healthy diet, and attaining healthy weight.The importance of adequate sleep also discussed. Regular seat belt use and home safety, is also discussed. Changes in health habits are decided on by the patient with goals and time frames  set for achieving them. Immunization and cancer screening needs are specifically addressed at this visit.   Anemia Check CBC and iron  Dyslipidemia Hyperlipidemia:Low fat diet discussed and encouraged.   Lipid Panel  Lab Results  Component Value Date   CHOL 154 03/21/2022   HDL 43 03/21/2022   LDLCALC 96 03/21/2022   TRIG 80 03/21/2022   CHOLHDL 3.6 03/21/2022     Update lab  Prediabetes Patient educated about the importance of limiting  Carbohydrate intake , the need to commit to daily physical activity for a minimum of 30 minutes , and to commit weight loss. The fact that changes in all these areas will reduce or eliminate all together the development of diabetes is stressed.      Latest Ref Rng & Units 03/21/2022    9:43 AM 12/06/2020    2:57 PM 05/02/2020    9:50 AM 05/01/2020    4:47 PM 07/26/2019   11:18 AM  Diabetic Labs  HbA1c 4.8 - 5.6 % 6.2  5.9  5.7  6.1   Chol 100 - 199 mg/dL 154   157   154   HDL >39 mg/dL 43   42   45   Calc LDL 0 - 99 mg/dL 96   100   93   Triglycerides 0 - 149 mg/dL 80   76   75   Creatinine 0.57 - 1.00 mg/dL 0.92   0.88   0.97       01/03/2023    8:36 AM 03/21/2022    8:59 AM 03/06/2021    2:50 PM 12/04/2020    3:24 PM 05/01/2020    4:39 PM 12/16/2019    3:21 PM 10/04/2019    9:13 AM  BP/Weight  Systolic BP 0000000 [id number]   0000000 [id number]  Diastolic BP  84 82   83 73 73  Wt. (Lbs) 307.12 311 311 308 306 311 311  BMI 59.98 kg/m2 60.74 kg/m2 60.74 kg/m2 60.15 kg/m2 59.76 kg/m2 60.74 kg/m2 60.74 kg/m2       No data to display          Updated lab needed at/ before next visit.   MORBID OBESITY  Patient re-educated about  the importance of commitment to a  minimum of 150 minutes of exercise per week as able.  The importance of healthy food choices with portion control discussed, as well as eating regularly and within a 12 hour window most days. The need to choose "clean , green" food 50 to 75% of the time is discussed, as well as to make water the primary drink and set a goal of 64 ounces water daily.       01/03/2023    8:36 AM 03/21/2022    8:59 AM 03/06/2021    2:50 PM  Weight /BMI  Weight 307 lb 1.9 oz 311 lb 311 lb  Height 5' (1.524 m) 5' (1.524 m) 5' (1.524 m)  BMI 59.98 kg/m2 60.74 kg/m2 60.74 kg/m2    Rx wegovy  Vitamin D deficiency Updated lab .

## 2023-01-07 LAB — LIPID PANEL

## 2023-01-08 LAB — CYTOLOGY - PAP
Adequacy: ABSENT
Comment: NEGATIVE
Diagnosis: NEGATIVE
High risk HPV: NEGATIVE

## 2023-01-10 LAB — CMP14+EGFR
ALT: 19 IU/L (ref 0–32)
AST: 14 IU/L (ref 0–40)
Albumin/Globulin Ratio: 1.3 (ref 1.2–2.2)
Albumin: 3.9 g/dL (ref 3.9–4.9)
Alkaline Phosphatase: 77 IU/L (ref 44–121)
BUN/Creatinine Ratio: 14 (ref 9–23)
BUN: 14 mg/dL (ref 6–24)
Bilirubin Total: 0.4 mg/dL (ref 0.0–1.2)
CO2: 19 mmol/L — ABNORMAL LOW (ref 20–29)
Calcium: 9 mg/dL (ref 8.7–10.2)
Chloride: 103 mmol/L (ref 96–106)
Creatinine, Ser: 0.97 mg/dL (ref 0.57–1.00)
Globulin, Total: 2.9 g/dL (ref 1.5–4.5)
Glucose: 110 mg/dL — ABNORMAL HIGH (ref 70–99)
Potassium: 4.2 mmol/L (ref 3.5–5.2)
Sodium: 140 mmol/L (ref 134–144)
Total Protein: 6.8 g/dL (ref 6.0–8.5)
eGFR: 75 mL/min/{1.73_m2} (ref >59)

## 2023-01-10 LAB — CBC
Hematocrit: 34.7 % (ref 34.0–46.6)
Hemoglobin: 11.3 g/dL (ref 11.1–15.9)
MCH: 27 pg (ref 26.6–33.0)
MCHC: 32.6 g/dL (ref 31.5–35.7)
MCV: 83 fL (ref 79–97)
Platelets: 372 10*3/uL (ref 150–450)
RBC: 4.18 x10E6/uL (ref 3.77–5.28)
RDW: 12.9 % (ref 11.7–15.4)
WBC: 5.5 10*3/uL (ref 3.4–10.8)

## 2023-01-10 LAB — LIPID PANEL
Chol/HDL Ratio: 3.5 ratio (ref 0.0–4.4)
Cholesterol, Total: 151 mg/dL (ref 100–199)
HDL: 43 mg/dL (ref >39)
LDL Chol Calc (NIH): 92 mg/dL (ref 0–99)
Triglycerides: 84 mg/dL (ref 0–149)
VLDL Cholesterol Cal: 16 mg/dL (ref 5–40)

## 2023-01-10 LAB — HEMOGLOBIN A1C
Est. average glucose Bld gHb Est-mCnc: 131 mg/dL
Hgb A1c MFr Bld: 6.2 % — ABNORMAL HIGH (ref 4.8–5.6)

## 2023-01-10 LAB — FERRITIN: Ferritin: 146 ng/mL (ref 15–150)

## 2023-01-10 LAB — VITAMIN D 25 HYDROXY (VIT D DEFICIENCY, FRACTURES): Vit D, 25-Hydroxy: 23.1 ng/mL — ABNORMAL LOW (ref 30.0–100.0)

## 2023-01-10 LAB — TSH: TSH: 0.737 u[IU]/mL (ref 0.450–4.500)

## 2023-01-10 LAB — IRON: Iron: 54 ug/dL (ref 27–159)

## 2023-01-14 ENCOUNTER — Telehealth: Payer: Self-pay | Admitting: Family Medicine

## 2023-01-14 NOTE — Telephone Encounter (Signed)
Called patient to pick up form and was faxed to [phone].

## 2023-01-15 NOTE — Progress Notes (Signed)
Appt scheduled pt aware  

## 2023-01-16 ENCOUNTER — Ambulatory Visit: Payer: BC Managed Care – PPO | Admitting: Family Medicine

## 2023-01-16 ENCOUNTER — Encounter: Payer: Self-pay | Admitting: Family Medicine

## 2023-01-16 VITALS — BP 128/80 | HR 90 | Ht 60.0 in | Wt 306.0 lb

## 2023-01-16 DIAGNOSIS — F411 Generalized anxiety disorder: Secondary | ICD-10-CM

## 2023-01-16 DIAGNOSIS — R7303 Prediabetes: Secondary | ICD-10-CM | POA: Diagnosis not present

## 2023-01-16 DIAGNOSIS — A599 Trichomoniasis, unspecified: Secondary | ICD-10-CM | POA: Diagnosis not present

## 2023-01-16 DIAGNOSIS — E785 Hyperlipidemia, unspecified: Secondary | ICD-10-CM | POA: Diagnosis not present

## 2023-01-16 MED ORDER — METRONIDAZOLE 500 MG PO TABS: 500.0000 mg | ORAL_TABLET | Freq: Two times a day (BID) | ORAL | 0 refills | Status: DC
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
## 2023-01-16 NOTE — Patient Instructions (Signed)
F/U as before , call if you need me sooner  Medication  is sent for infection and your husband will be treated also  Check on medication for weight loss so you can start this please  It is important that you exercise regularly at least 30 minutes 5 times a week. If you develop chest pain, have severe difficulty breathing, or feel very tired, stop exercising immediately and seek medical attention   Thanks for choosing [facility], we consider it a privelige to serve you.
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
## 2023-01-20 ENCOUNTER — Encounter: Payer: Self-pay | Admitting: Family Medicine

## 2023-01-21 ENCOUNTER — Encounter: Payer: Self-pay | Admitting: Psychiatry

## 2023-01-21 ENCOUNTER — Telehealth (INDEPENDENT_AMBULATORY_CARE_PROVIDER_SITE_OTHER): Payer: BC Managed Care – PPO | Admitting: Psychiatry

## 2023-01-21 ENCOUNTER — Other Ambulatory Visit: Payer: Self-pay

## 2023-01-21 DIAGNOSIS — F3341 Major depressive disorder, recurrent, in partial remission: Secondary | ICD-10-CM

## 2023-01-21 DIAGNOSIS — F411 Generalized anxiety disorder: Secondary | ICD-10-CM

## 2023-01-21 MED ORDER — WEGOVY 0.25 MG/0.5ML ~~LOC~~ SOAJ: 0.2500 mg | SUBCUTANEOUS | 0 refills | Status: DC

## 2023-01-21 MED ORDER — SERTRALINE HCL 100 MG PO TABS: ORAL_TABLET | ORAL | 0 refills | Status: DC

## 2023-01-21 NOTE — Telephone Encounter (Signed)
See other tele msg  

## 2023-01-21 NOTE — Patient Instructions (Signed)
Restart sertraline 50 mg at night for one week, then 100 mg at night  Hold Buspar Next appointment: 7/3 at 1 PM

## 2023-01-24 ENCOUNTER — Encounter: Payer: Self-pay | Admitting: Family Medicine

## 2023-01-24 NOTE — Progress Notes (Signed)
[name]     MRN: [id number]      DOB: 07-Jun-1981   HPI [name] is here for follow up and re-evaluation of chronic medical conditions, medication management and review of any available recent lab and radiology data.  Visit is specifically to review lab result with positive trichomonas infection States this has occurred in the past, she has already discussed with her husband and he too will need treatment Still has ot checked into anti obesity medication ROS Denies recent fever or chills. Denies sinus pressure, nasal congestion, ear pain or sore throat. Denies chest congestion, productive cough or wheezing. Denies chest pains, palpitations and leg swelling Denies abdominal pain, nausea, vomiting,diarrhea or constipation.   Denies dysuria, frequency, hesitancy or incontinence. Denies joint pain, swelling and limitation in mobility. Denies headaches, seizures, numbness, or tingling. Denies depression, anxiety or insomnia. Denies skin break down or rash.   PE  BP 128/80   Pulse 90   Ht 5' (1.524 m)   Wt (!) 306 lb (138.8 kg)   SpO2 95%   BMI 59.76 kg/m   Patient alert and oriented and in no cardiopulmonary distress.  HEENT: No facial asymmetry, EOMI,     Neck supple .  Chest: Clear to auscultation bilaterally.  CVS: S1, S2 no murmurs, no S3.Regular rate.  ABD: Soft non tender.   Ext: No edema  MS: Adequate ROM spine, shoulders, hips and knees.  Skin: Intact, no ulcerations or rash noted.  Psych: Good eye contact, normal affect. Memory intact not anxious or depressed appearing.  CNS: CN 2-12 intact, power,  normal throughout.no focal deficits noted.   Assessment & Plan  Trichomonas infection Pt has had in the past, spouse aware, will treat both as they are both my pateinets   Morbid obesity  Patient re-educated about  the importance of commitment to a  minimum of 150 minutes of exercise per week as able.  The importance of healthy food  choices with portion control discussed, as well as eating regularly and within a 12 hour window most days. The need to choose "clean , green" food 50 to 75% of the time is discussed, as well as to make water the primary drink and set a goal of 64 ounces water daily.       01/16/2023    9:29 AM 01/03/2023    8:36 AM 03/21/2022    8:59 AM  Weight /BMI  Weight 306 lb 307 lb 1.9 oz 311 lb  Height 5' (1.524 m) 5' (1.524 m) 5' (1.524 m)  BMI 59.76 kg/m2 59.98 kg/m2 60.74 kg/m2    Needs to check on medication availability and start same  Prediabetes Patient educated about the importance of limiting  Carbohydrate intake , the need to commit to daily physical activity for a minimum of 30 minutes , and to commit weight loss. The fact that changes in all these areas will reduce or eliminate all together the development of diabetes is stressed.      Latest Ref Rng & Units 01/03/2023    9:43 AM 03/21/2022    9:43 AM 12/06/2020    2:57 PM 05/02/2020    9:50 AM 05/01/2020    4:47 PM  Diabetic Labs  HbA1c 4.8 - 5.6 % 6.2  6.2  5.9   5.7   Chol 100 - 199 mg/dL 102  725   366    HDL >44 mg/dL 43  43   42    Calc LDL 0 -  99 mg/dL 92  96   161    Triglycerides 0 - 149 mg/dL 84  80   76    Creatinine 0.57 - 1.00 mg/dL 0.96  0.45   4.09        01/16/2023   10:01 AM 01/16/2023    9:30 AM 01/16/2023    9:29 AM 01/03/2023    8:36 AM 03/21/2022    8:59 AM 03/06/2021    2:50 PM 12/04/2020    3:24 PM  BP/Weight  Systolic BP 128 168 171 138 122    Diastolic BP 80 82 82 84 82    Wt. (Lbs)   306 307.12 311 311 308  BMI   59.76 kg/m2 59.98 kg/m2 60.74 kg/m2 60.74 kg/m2 60.15 kg/m2       No data to display          Updated lab needed at/ before next visit.   Dyslipidemia Hyperlipidemia:Low fat diet discussed and encouraged.   Lipid Panel  Lab Results  Component Value Date   CHOL 151 01/03/2023   HDL 43 01/03/2023   LDLCALC 92 01/03/2023   TRIG 84 01/03/2023   CHOLHDL 3.5 01/03/2023      resolved  GAD (generalized anxiety disorder) Resolved has not been taking medication for months and is asymptomatic, will alert her Psych and she agrees

## 2023-01-24 NOTE — Assessment & Plan Note (Signed)
Hyperlipidemia:Low fat diet discussed and encouraged.   Lipid Panel  Lab Results  Component Value Date   CHOL 151 01/03/2023   HDL 43 01/03/2023   LDLCALC 92 01/03/2023   TRIG 84 01/03/2023   CHOLHDL 3.5 01/03/2023     resolved

## 2023-01-24 NOTE — Progress Notes (Incomplete)
   GLENDOLYN MELIA     MRN: [id number]      DOB: 01-Dec-1980   HPI Ms. Frybarger is here for follow up and re-evaluation of chronic medical conditions, medication management and review of any available recent lab and radiology data.  Visit is specifically to review lab result with positive trichomonas infection States this has occurred in the past, she has already discussed with her husband and he too will need treatment Still has ot checked into anti obesity medication ROS Denies recent fever or chills. Denies sinus pressure, nasal congestion, ear pain or sore throat. Denies chest congestion, productive cough or wheezing. Denies chest pains, palpitations and leg swelling Denies abdominal pain, nausea, vomiting,diarrhea or constipation.   Denies dysuria, frequency, hesitancy or incontinence. Denies joint pain, swelling and limitation in mobility. Denies headaches, seizures, numbness, or tingling. Denies depression, anxiety or insomnia. Denies skin break down or rash.   PE  BP 128/80   Pulse 90   Ht 5' (1.524 m)   Wt (!) 306 lb (138.8 kg)   SpO2 95%   BMI 59.76 kg/m   Patient alert and oriented and in no cardiopulmonary distress.  HEENT: No facial asymmetry, EOMI,     Neck supple .  Chest: Clear to auscultation bilaterally.  CVS: S1, S2 no murmurs, no S3.Regular rate.  ABD: Soft non tender.   Ext: No edema  MS: Adequate ROM spine, shoulders, hips and knees.  Skin: Intact, no ulcerations or rash noted.  Psych: Good eye contact, normal affect. Memory intact not anxious or depressed appearing.  CNS: CN 2-12 intact, power,  normal throughout.no focal deficits noted.   Assessment & Plan  Trichomonas infection Pt jhas had in the past, spouse aware, will treat both as they are both my pateinets

## 2023-01-24 NOTE — Assessment & Plan Note (Signed)
Patient educated about the importance of limiting  Carbohydrate intake , the need to commit to daily physical activity for a minimum of 30 minutes , and to commit weight loss. The fact that changes in all these areas will reduce or eliminate all together the development of diabetes is stressed.      Latest Ref Rng & Units 01/03/2023    9:43 AM 03/21/2022    9:43 AM 12/06/2020    2:57 PM 05/02/2020    9:50 AM 05/01/2020    4:47 PM  Diabetic Labs  HbA1c 4.8 - 5.6 % 6.2  6.2  5.9   5.7   Chol 100 - 199 mg/dL 027  741   287    HDL >86 mg/dL 43  43   42    Calc LDL 0 - 99 mg/dL 92  96   767    Triglycerides 0 - 149 mg/dL 84  80   76    Creatinine 0.57 - 1.00 mg/dL 2.09  4.70   9.62        01/16/2023   10:01 AM 01/16/2023    9:30 AM 01/16/2023    9:29 AM 01/03/2023    8:36 AM 03/21/2022    8:59 AM 03/06/2021    2:50 PM 12/04/2020    3:24 PM  BP/Weight  Systolic BP 128 168 171 138 122    Diastolic BP 80 82 82 84 82    Wt. (Lbs)   306 307.12 311 311 308  BMI   59.76 kg/m2 59.98 kg/m2 60.74 kg/m2 60.74 kg/m2 60.15 kg/m2       No data to display          Updated lab needed at/ before next visit.

## 2023-01-24 NOTE — Assessment & Plan Note (Signed)
Resolved has not been taking medication for months and is asymptomatic, will alert her Psych and she agrees

## 2023-01-24 NOTE — Assessment & Plan Note (Signed)
  Patient re-educated about  the importance of commitment to a  minimum of 150 minutes of exercise per week as able.  The importance of healthy food choices with portion control discussed, as well as eating regularly and within a 12 hour window most days. The need to choose "clean , green" food 50 to 75% of the time is discussed, as well as to make water the primary drink and set a goal of 64 ounces water daily.       01/16/2023    9:29 AM 01/03/2023    8:36 AM 03/21/2022    8:59 AM  Weight /BMI  Weight 306 lb 307 lb 1.9 oz 311 lb  Height 5' (1.524 m) 5' (1.524 m) 5' (1.524 m)  BMI 59.76 kg/m2 59.98 kg/m2 60.74 kg/m2    Needs to check on medication availability and start same

## 2023-02-20 ENCOUNTER — Other Ambulatory Visit: Payer: Self-pay | Admitting: Family Medicine

## 2023-04-11 ENCOUNTER — Other Ambulatory Visit: Payer: Self-pay | Admitting: Family Medicine

## 2023-04-11 ENCOUNTER — Encounter: Payer: Self-pay | Admitting: Family Medicine

## 2023-04-11 ENCOUNTER — Telehealth: Payer: Self-pay | Admitting: Family Medicine

## 2023-04-11 ENCOUNTER — Ambulatory Visit: Payer: BC Managed Care – PPO | Admitting: Family Medicine

## 2023-04-11 DIAGNOSIS — F411 Generalized anxiety disorder: Secondary | ICD-10-CM | POA: Diagnosis not present

## 2023-04-11 DIAGNOSIS — Z1231 Encounter for screening mammogram for malignant neoplasm of breast: Secondary | ICD-10-CM | POA: Diagnosis not present

## 2023-04-11 DIAGNOSIS — N6320 Unspecified lump in the left breast, unspecified quadrant: Secondary | ICD-10-CM

## 2023-04-11 DIAGNOSIS — E559 Vitamin D deficiency, unspecified: Secondary | ICD-10-CM

## 2023-04-11 NOTE — Patient Instructions (Signed)
Follow-up in 12 weeks, call if you need me sooner.  I do recommend attended group sessions per your preference  regarding help with food choices and blood sugar control and weight loss and have given you the information needed.  Nurse will again attempt to get your pre-COVID prior authorized and if you have not had a message about the situation within 1 week please do reach out via My Chart,  so that decision can be made k as to what medication you will use to help with weight loss.  As we discussed phentermine at low-dose is an option that you are willing to take if needed.  Getting rid of fast food and sodas will move you quickly in the right direction, although it is hard I encourage you to make the big decision and work with it.  Mammogram to be scheduled at checkout.  Thanks for choosing [facility], we consider it a privelige to serve you.

## 2023-04-11 NOTE — Telephone Encounter (Signed)
Ordered and pt can call and schedule

## 2023-04-11 NOTE — Assessment & Plan Note (Signed)
Managed by psych, sub optimal control

## 2023-04-11 NOTE — Progress Notes (Signed)
   [name]     MRN: [id number]      DOB: 1981/03/15  Chief Complaint  Patient presents with   Follow-up    Follow up  Complains of increased stress in the past 3 weeks since her 42 year old brother suffered a stroke and still is hospitalized paralyzed.  She has not been able to get the medication prescribed semaglutide for weight loss and states that frankly she has really done nothing with her lifestyle to facilitate weight loss up until this point.  She states that she is ready and knows that she needs to change her habits and to work at losing weight on a consistent level.  HPI [name] is here for follow up and re-evaluation of chronic medical conditions, medication management and review of any available recent lab and radiology data.  Preventive health is updated, specifically  Cancer screening and Immunization.     ROS Denies recent fever or chills. Denies sinus pressure, nasal congestion, ear pain or sore throat. Denies chest congestion, productive cough or wheezing. Denies chest pains, palpitations and leg swelling Denies abdominal pain, nausea, vomiting,diarrhea or constipation.   Denies dysuria, frequency, hesitancy or incontinence. Denies joint pain, swelling and limitation in mobility. Denies headaches, seizures, numbness, or tingling.  Denies skin break down or rash.   PE  BP 124/88   Pulse 85   Ht 5' (1.524 m)   Wt (!) 304 lb (137.9 kg)   SpO2 96%   BMI 59.37 kg/m   Patient alert and oriented and in no cardiopulmonary distress.  HEENT: No facial asymmetry, EOMI,     Neck supple .  Chest: Clear to auscultation bilaterally.  CVS: S1, S2 no murmurs, no S3.Regular rate.  ABD: Soft non tender.   Ext: No edema  MS: Adequate ROM spine, shoulders, hips and knees.  Skin: Intact, no ulcerations or rash noted.  Psych: Good eye contact, normal affect. Memory intact not anxious or depressed appearing.  CNS: CN 2-12 intact, power,  normal  throughout.no focal deficits noted.   Assessment & Plan  Vitamin D deficiency Taking medication as prescribed Updated lab needed in 2025, January to February  Morbid obesity [facility])  Patient re-educated about  the importance of commitment to a  minimum of 150 minutes of exercise per week as able.  The importance of healthy food choices with portion control discussed, as well as eating regularly and within a 12 hour window most days. The need to choose "clean , green" food 50 to 75% of the time is discussed, as well as to make water the primary drink and set a goal of 64 ounces water daily.       04/11/2023    8:26 AM 01/16/2023    9:29 AM 01/03/2023    8:36 AM  Weight /BMI  Weight 304 lb 306 lb 307 lb 1.9 oz  Height 5' (1.524 m) 5' (1.524 m) 5' (1.524 m)  BMI 59.37 kg/m2 59.76 kg/m2 59.98 kg/m2    Unchanged,  will rx phentermine if phentermine not covered  GAD (generalized anxiety disorder) Managed by psych, sub optimal control  Dyslipidemia Hyperlipidemia:Low fat diet discussed and encouraged.   Lipid Panel  Lab Results  Component Value Date   CHOL 151 01/03/2023   HDL 43 01/03/2023   LDLCALC 92 01/03/2023   TRIG 84 01/03/2023   CHOLHDL 3.5 01/03/2023     Controlled, no change in medication

## 2023-04-11 NOTE — Assessment & Plan Note (Signed)
Hyperlipidemia:Low fat diet discussed and encouraged.   Lipid Panel  Lab Results  Component Value Date   CHOL 151 01/03/2023   HDL 43 01/03/2023   LDLCALC 92 01/03/2023   TRIG 84 01/03/2023   CHOLHDL 3.5 01/03/2023     Controlled, no change in medication

## 2023-04-11 NOTE — Telephone Encounter (Signed)
Outbound call placed to schedule screening mammogram. According to radiology, patient needs an order for a diagnostic mammogram which is overdue (should've been received December 2023); needed before patient can have a regular screening mammogram. Please update order and notify patient to schedule.

## 2023-04-11 NOTE — Assessment & Plan Note (Signed)
  Patient re-educated about  the importance of commitment to a  minimum of 150 minutes of exercise per week as able.  The importance of healthy food choices with portion control discussed, as well as eating regularly and within a 12 hour window most days. The need to choose "clean , green" food 50 to 75% of the time is discussed, as well as to make water the primary drink and set a goal of 64 ounces water daily.       04/11/2023    8:26 AM 01/16/2023    9:29 AM 01/03/2023    8:36 AM  Weight /BMI  Weight 304 lb 306 lb 307 lb 1.9 oz  Height 5' (1.524 m) 5' (1.524 m) 5' (1.524 m)  BMI 59.37 kg/m2 59.76 kg/m2 59.98 kg/m2    Unchanged,  will rx phentermine if phentermine not covered

## 2023-04-11 NOTE — Assessment & Plan Note (Signed)
Taking medication as prescribed Updated lab needed in 2025, January to February
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
## 2023-04-15 ENCOUNTER — Ambulatory Visit (HOSPITAL_COMMUNITY): Payer: BC Managed Care – PPO

## 2023-04-15 ENCOUNTER — Encounter (HOSPITAL_COMMUNITY): Payer: BC Managed Care – PPO

## 2023-04-16 ENCOUNTER — Encounter: Payer: Self-pay | Admitting: Psychiatry

## 2023-04-16 ENCOUNTER — Telehealth (INDEPENDENT_AMBULATORY_CARE_PROVIDER_SITE_OTHER): Payer: BC Managed Care – PPO | Admitting: Psychiatry

## 2023-04-16 DIAGNOSIS — F411 Generalized anxiety disorder: Secondary | ICD-10-CM

## 2023-04-16 DIAGNOSIS — F330 Major depressive disorder, recurrent, mild: Secondary | ICD-10-CM | POA: Diagnosis not present

## 2023-04-16 MED ORDER — SERTRALINE HCL 100 MG PO TABS
150.0000 mg | ORAL_TABLET | Freq: Every day | ORAL | 0 refills | Status: DC
Start: 2023-04-16 — End: 2023-05-28

## 2023-04-16 NOTE — Patient Instructions (Signed)
Increase sertraline 150 mg at night Next appointment- 8/14 at 8 am

## 2023-04-21 ENCOUNTER — Encounter: Payer: Self-pay | Admitting: Family Medicine

## 2023-04-21 ENCOUNTER — Other Ambulatory Visit: Payer: Self-pay

## 2023-04-21 MED ORDER — WEGOVY 0.25 MG/0.5ML ~~LOC~~ SOAJ: 0.2500 mg | SUBCUTANEOUS | 0 refills | Status: DC

## 2023-05-13 ENCOUNTER — Encounter (HOSPITAL_COMMUNITY): Payer: BC Managed Care – PPO

## 2023-05-13 ENCOUNTER — Ambulatory Visit (HOSPITAL_COMMUNITY): Admission: RE | Admit: 2023-05-13 | Payer: BC Managed Care – PPO | Source: Ambulatory Visit

## 2023-05-21 MED ORDER — SEMAGLUTIDE(0.25 OR 0.5MG/DOS) 2 MG/3ML ~~LOC~~ SOPN: 0.2500 mg | PEN_INJECTOR | SUBCUTANEOUS | 0 refills | Status: DC
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
## 2023-05-25 ENCOUNTER — Other Ambulatory Visit: Payer: Self-pay | Admitting: Family Medicine

## 2023-05-28 ENCOUNTER — Telehealth: Payer: BC Managed Care – PPO | Admitting: Psychiatry

## 2023-05-28 ENCOUNTER — Encounter: Payer: Self-pay | Admitting: Psychiatry

## 2023-05-28 DIAGNOSIS — F330 Major depressive disorder, recurrent, mild: Secondary | ICD-10-CM

## 2023-05-28 DIAGNOSIS — F411 Generalized anxiety disorder: Secondary | ICD-10-CM | POA: Diagnosis not present

## 2023-05-28 MED ORDER — SERTRALINE HCL 100 MG PO TABS
150.0000 mg | ORAL_TABLET | Freq: Every day | ORAL | 0 refills | Status: DC
Start: 2023-07-15 — End: 2023-08-06

## 2023-06-18 ENCOUNTER — Other Ambulatory Visit: Payer: Self-pay | Admitting: Family Medicine

## 2023-07-04 ENCOUNTER — Ambulatory Visit: Payer: BC Managed Care – PPO | Admitting: Family Medicine

## 2023-07-04 ENCOUNTER — Encounter: Payer: Self-pay | Admitting: Family Medicine

## 2023-07-04 VITALS — BP 126/78 | HR 92 | Ht 60.0 in | Wt 296.1 lb

## 2023-07-04 DIAGNOSIS — Z23 Encounter for immunization: Secondary | ICD-10-CM

## 2023-07-04 DIAGNOSIS — F411 Generalized anxiety disorder: Secondary | ICD-10-CM | POA: Diagnosis not present

## 2023-07-04 DIAGNOSIS — M25562 Pain in left knee: Secondary | ICD-10-CM

## 2023-07-04 DIAGNOSIS — E559 Vitamin D deficiency, unspecified: Secondary | ICD-10-CM

## 2023-07-04 DIAGNOSIS — J309 Allergic rhinitis, unspecified: Secondary | ICD-10-CM | POA: Diagnosis not present

## 2023-07-04 DIAGNOSIS — R7303 Prediabetes: Secondary | ICD-10-CM

## 2023-07-04 MED ORDER — AZELASTINE HCL 0.1 % NA SOLN: 1.0000 | Freq: Two times a day (BID) | NASAL | 1 refills | Status: DC

## 2023-07-04 MED ORDER — ZEPBOUND 2.5 MG/0.5ML ~~LOC~~ SOAJ: 2.5000 mg | SUBCUTANEOUS | 0 refills | Status: DC

## 2023-07-04 NOTE — Patient Instructions (Addendum)
F/U in early January call if you need me sooner  HBA1C today  Flu vaccine today  Plsreschedule your mammogram  Zepbound prescribed for weight loss , will see if covered, if not then will start phentermine 15 mg  daily  Thanks for choosing [facility], we consider it a privelige to serve you.

## 2023-07-05 LAB — HEMOGLOBIN A1C
Est. average glucose Bld gHb Est-mCnc: 120 mg/dL
Hgb A1c MFr Bld: 5.8 % — ABNORMAL HIGH (ref 4.8–5.6)

## 2023-07-06 ENCOUNTER — Encounter: Payer: Self-pay | Admitting: Family Medicine

## 2023-07-06 NOTE — Assessment & Plan Note (Signed)
Once weekly vit D supplement

## 2023-07-06 NOTE — Progress Notes (Signed)
[name]     MRN: [id number]      DOB: 09-Oct-1981  Chief Complaint  Patient presents with   Follow-up    Follow up sinus drainage    HPI [name] is here for follow up and re-evaluation of chronic medical conditions, medication management and review of any available recent lab and radiology data.  Preventive health is updated, specifically  Cancer screening and Immunization.   Questions or concerns regarding consultations or procedures which the PT has had in the interim are  addressed. The PT denies any adverse reactions to current medications since the last visit.  Will use phentermine if injectable med not covered C/o increased nasal cogestion and post nasal drainage over the past 4 to 6 weeks, drainage is clear, no fevr or chills  ROS Denies recent fever or chills. Denies  ear pain or sore throat. Denies chest congestion, productive cough or wheezing. Denies chest pains, palpitations and leg swelling Denies abdominal pain, nausea, vomiting,diarrhea or constipation.   Denies dysuria, frequency, hesitancy or incontinence. Denies joint pain, swelling and limitation in mobility. Denies headaches, seizures, numbness, or tingling. Denies depression, anxiety or insomnia. Denies skin break down or rash.   PE  BP 126/78 (BP Location: Right Arm, Patient Position: Sitting, Cuff Size: Large)   Pulse 92   Ht 5' (1.524 m)   Wt 296 lb 1.9 oz (134.3 kg)   SpO2 98%   BMI 57.83 kg/m   Patient alert and oriented and in no cardiopulmonary distress.  HEENT: No facial asymmetry, EOMI,     Neck supple .  Chest: Clear to auscultation bilaterally.  CVS: S1, S2 no murmurs, no S3.Regular rate.  ABD: Soft non tender.   Ext: No edema  MS: Adequate ROM spine, shoulders, hips and knees.  Skin: Intact, no ulcerations or rash noted.  Psych: Good eye contact, normal affect. Memory intact not anxious or depressed appearing.  CNS: CN 2-12 intact, power,  normal  throughout.no focal deficits noted.   Assessment & Plan  Prediabetes Improved Patient educated about the importance of limiting  Carbohydrate intake , the need to commit to daily physical activity for a minimum of 30 minutes , and to commit weight loss. The fact that changes in all these areas will reduce or eliminate all together the development of diabetes is stressed.      Latest Ref Rng & Units 07/04/2023    4:29 PM 01/03/2023    9:43 AM 03/21/2022    9:43 AM 12/06/2020    2:57 PM 05/02/2020    9:50 AM  Diabetic Labs  HbA1c 4.8 - 5.6 % 5.8  6.2  6.2  5.9    Chol 100 - 199 mg/dL  086  578   469   HDL >62 mg/dL  43  43   42   Calc LDL 0 - 99 mg/dL  92  96   952   Triglycerides 0 - 149 mg/dL  84  80   76   Creatinine 0.57 - 1.00 mg/dL  8.41  3.24   4.01       07/04/2023    4:05 PM 04/11/2023    8:41 AM 04/11/2023    8:28 AM 04/11/2023    8:26 AM 01/16/2023   10:01 AM 01/16/2023    9:30 AM 01/16/2023    9:29 AM  BP/Weight  Systolic BP 126 124 131 141 128 168 171  Diastolic BP 78 88 83 82 80 82 82  Wt. (Lbs)  296.12   304   306  BMI 57.83 kg/m2   59.37 kg/m2   59.76 kg/m2       No data to display            Morbid obesity (HCC)  Patient re-educated about  the importance of commitment to a  minimum of 150 minutes of exercise per week as able.  The importance of healthy food choices with portion control discussed, as well as eating regularly and within a 12 hour window most days. The need to choose "clean , green" food 50 to 75% of the time is discussed, as well as to make water the primary drink and set a goal of 64 ounces water daily.       07/04/2023    4:05 PM 04/11/2023    8:26 AM 01/16/2023    9:29 AM  Weight /BMI  Weight 296 lb 1.9 oz 304 lb 306 lb  Height 5' (1.524 m) 5' (1.524 m) 5' (1.524 m)  BMI 57.83 kg/m2 59.37 kg/m2 59.76 kg/m2    Improved, zepbound prescribed  GAD (generalized anxiety disorder) Controlled, gets counseling from Psych howveer not taking  meds prescribed  Vitamin D deficiency Once weekly vit D supplement  Left anterior knee pain Uses meloxicam as needed  Allergic rhinitis Increased and uncontrolled symptoms in past 4 to 6 weeks, Astelin prescribed

## 2023-07-06 NOTE — Assessment & Plan Note (Signed)
Controlled, gets counseling from Psych howveer not taking meds prescribed

## 2023-07-06 NOTE — Assessment & Plan Note (Signed)
Uses meloxicam as needed

## 2023-07-06 NOTE — Assessment & Plan Note (Signed)
Increased and uncontrolled symptoms in past 4 to 6 weeks, Astelin prescribed

## 2023-07-06 NOTE — Assessment & Plan Note (Signed)
Improved Patient educated about the importance of limiting  Carbohydrate intake , the need to commit to daily physical activity for a minimum of 30 minutes , and to commit weight loss. The fact that changes in all these areas will reduce or eliminate all together the development of diabetes is stressed.      Latest Ref Rng & Units 07/04/2023    4:29 PM 01/03/2023    9:43 AM 03/21/2022    9:43 AM 12/06/2020    2:57 PM 05/02/2020    9:50 AM  Diabetic Labs  HbA1c 4.8 - 5.6 % 5.8  6.2  6.2  5.9    Chol 100 - 199 mg/dL  010  932   355   HDL >73 mg/dL  43  43   42   Calc LDL 0 - 99 mg/dL  92  96   220   Triglycerides 0 - 149 mg/dL  84  80   76   Creatinine 0.57 - 1.00 mg/dL  2.54  2.70   6.23       07/04/2023    4:05 PM 04/11/2023    8:41 AM 04/11/2023    8:28 AM 04/11/2023    8:26 AM 01/16/2023   10:01 AM 01/16/2023    9:30 AM 01/16/2023    9:29 AM  BP/Weight  Systolic BP 126 124 131 141 128 168 171  Diastolic BP 78 88 83 82 80 82 82  Wt. (Lbs) 296.12   304   306  BMI 57.83 kg/m2   59.37 kg/m2   59.76 kg/m2       No data to display

## 2023-07-06 NOTE — Assessment & Plan Note (Signed)
  Patient re-educated about  the importance of commitment to a  minimum of 150 minutes of exercise per week as able.  The importance of healthy food choices with portion control discussed, as well as eating regularly and within a 12 hour window most days. The need to choose "clean , green" food 50 to 75% of the time is discussed, as well as to make water the primary drink and set a goal of 64 ounces water daily.       07/04/2023    4:05 PM 04/11/2023    8:26 AM 01/16/2023    9:29 AM  Weight /BMI  Weight 296 lb 1.9 oz 304 lb 306 lb  Height 5' (1.524 m) 5' (1.524 m) 5' (1.524 m)  BMI 57.83 kg/m2 59.37 kg/m2 59.76 kg/m2    Improved, zepbound prescribed
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
## 2023-08-06 ENCOUNTER — Encounter: Payer: Self-pay | Admitting: Psychiatry

## 2023-08-06 ENCOUNTER — Telehealth (INDEPENDENT_AMBULATORY_CARE_PROVIDER_SITE_OTHER): Payer: BC Managed Care – PPO | Admitting: Psychiatry

## 2023-08-06 DIAGNOSIS — F3341 Major depressive disorder, recurrent, in partial remission: Secondary | ICD-10-CM

## 2023-08-06 DIAGNOSIS — F330 Major depressive disorder, recurrent, mild: Secondary | ICD-10-CM | POA: Diagnosis not present

## 2023-08-06 DIAGNOSIS — F411 Generalized anxiety disorder: Secondary | ICD-10-CM | POA: Diagnosis not present

## 2023-08-06 MED ORDER — SERTRALINE HCL 100 MG PO TABS: 150.0000 mg | ORAL_TABLET | Freq: Every day | ORAL | 0 refills | Status: DC

## 2023-08-06 NOTE — Patient Instructions (Signed)
Continue sertraline 150 mg at night Next appointment- 1/22 at 8 20

## 2023-10-16 IMAGING — MG DIGITAL DIAGNOSTIC BILAT W/ TOMO W/ CAD
6 of 12 series · 6 of 36 positions shown · non-contrast
Comparison: Screening mammogram dated 03/25/2022.

CLINICAL DATA: Screening recall for bilateral breast asymmetries.

EXAM:
DIGITAL DIAGNOSTIC BILATERAL MAMMOGRAM WITH TOMOSYNTHESIS AND CAD;
ULTRASOUND LEFT BREAST LIMITED
TECHNIQUE: Bilateral digital diagnostic mammography and breast tomosynthesis
was performed. The images were evaluated with computer-aided
detection.; Targeted ultrasound examination of the left breast was
performed.

[L CC synth-2D (1 of 2)]
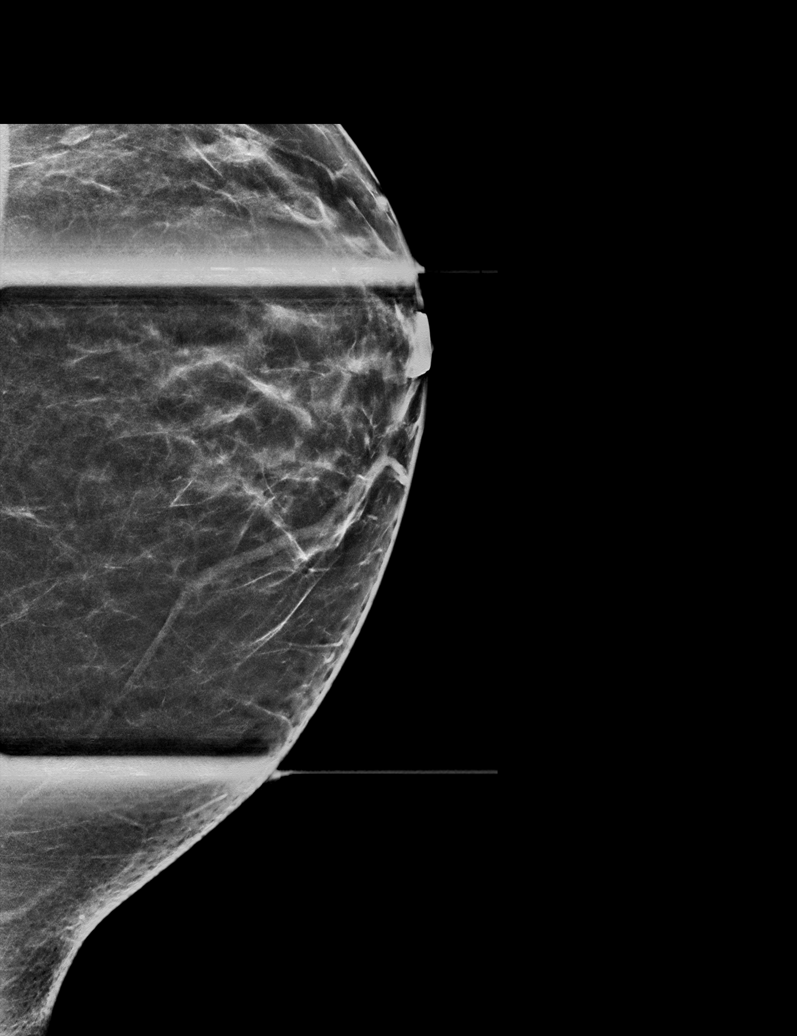

[R CC synth-2D]
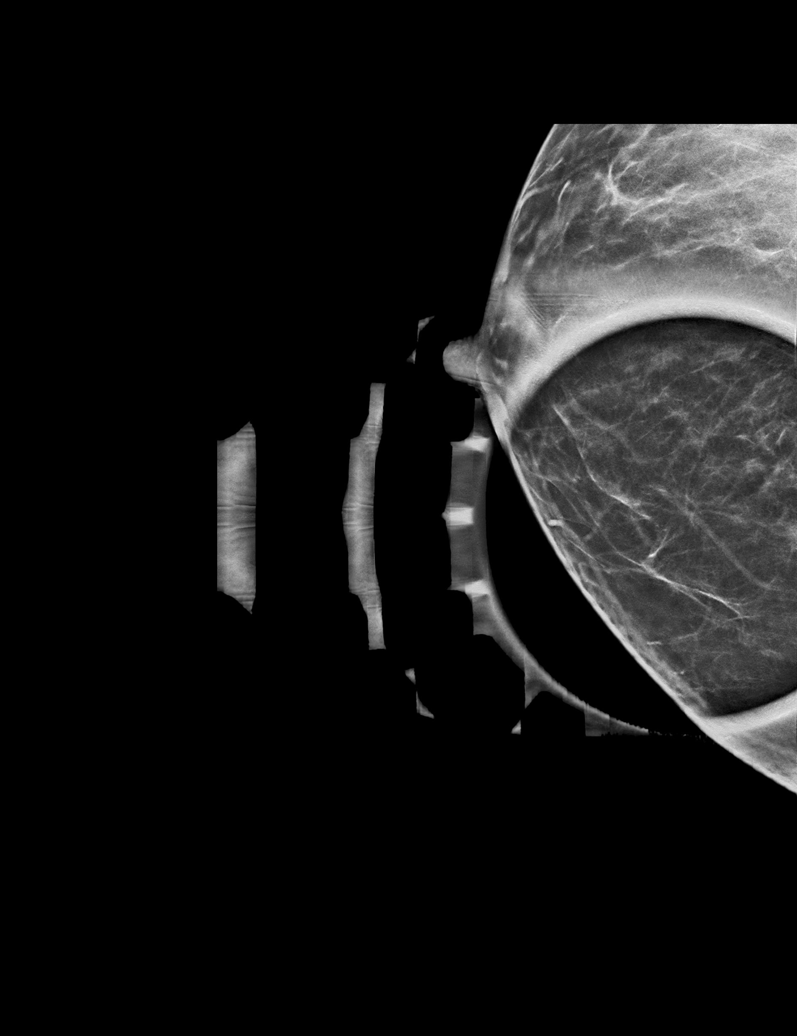

[R ML synth-2D (1 of 2)]
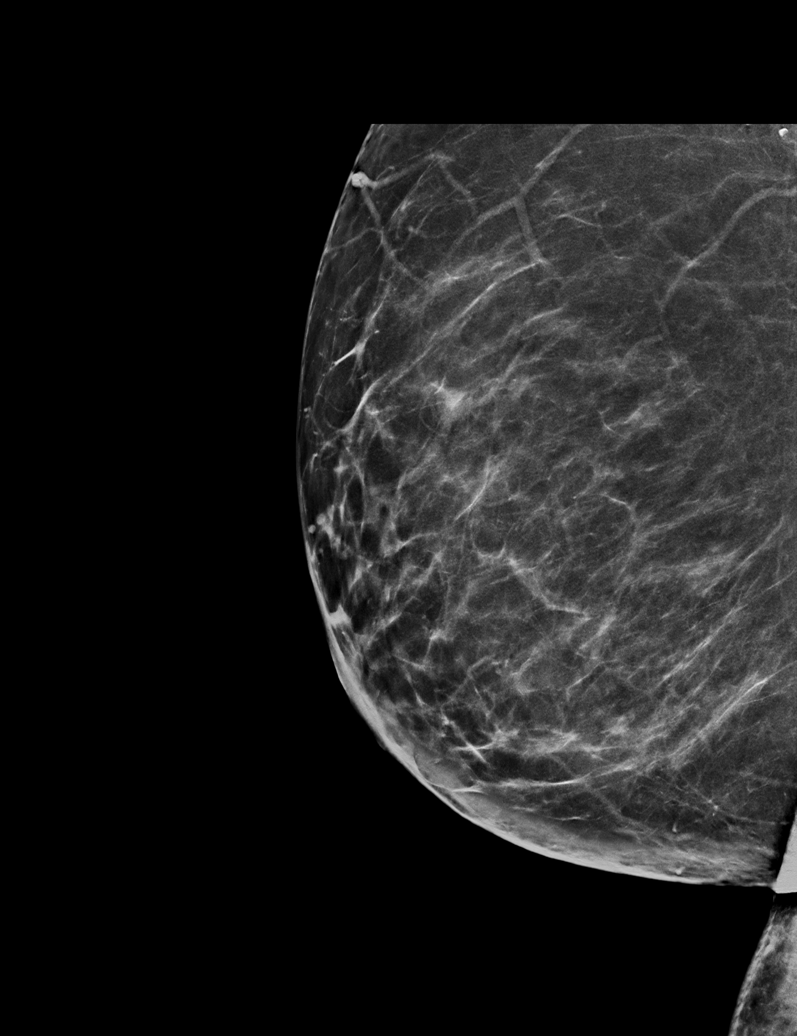

[L CC synth-2D (2 of 2)]
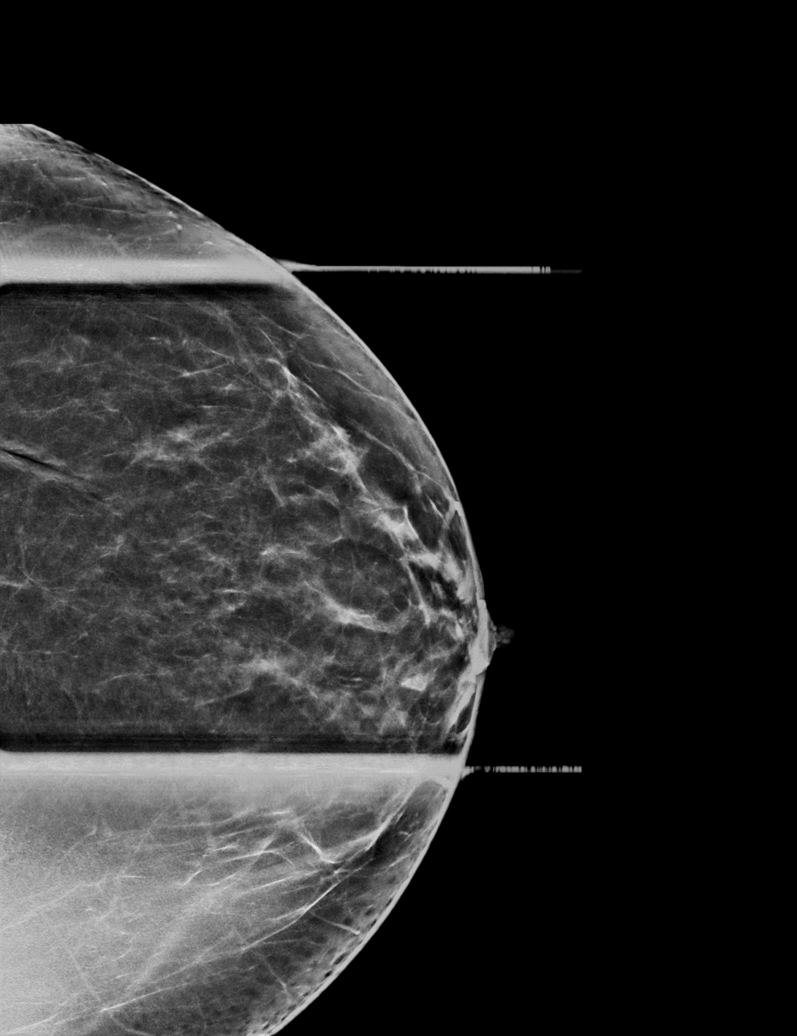

[L MLO synth-2D]
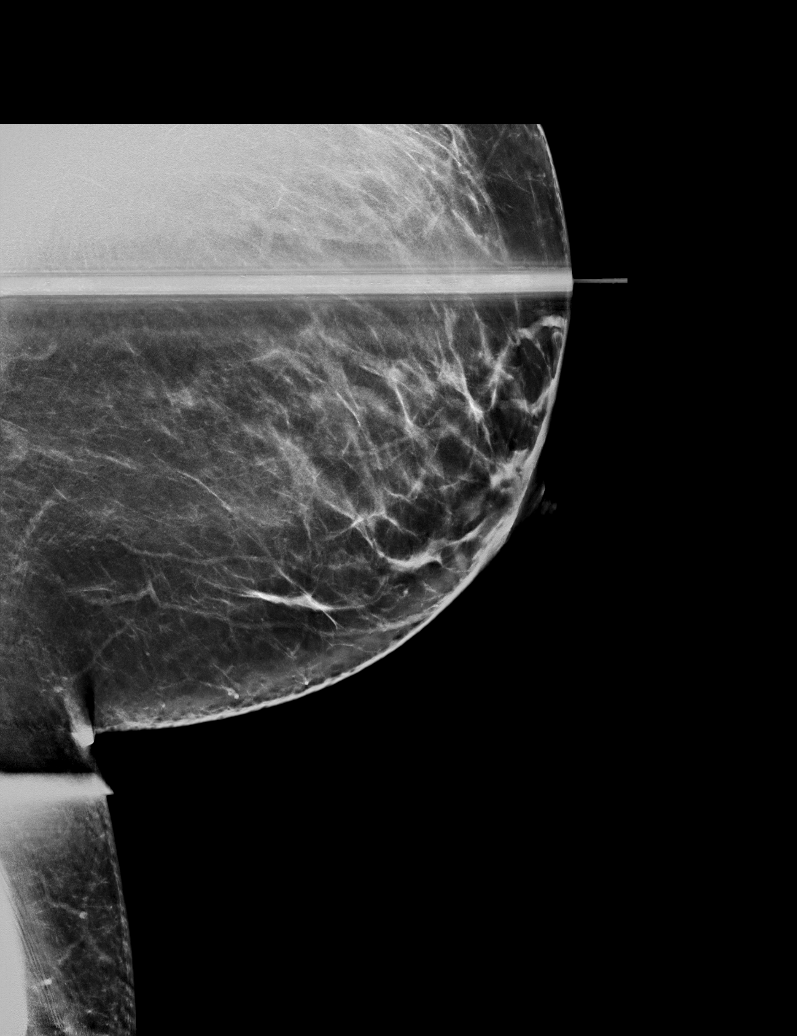

[R ML synth-2D (2 of 2)]
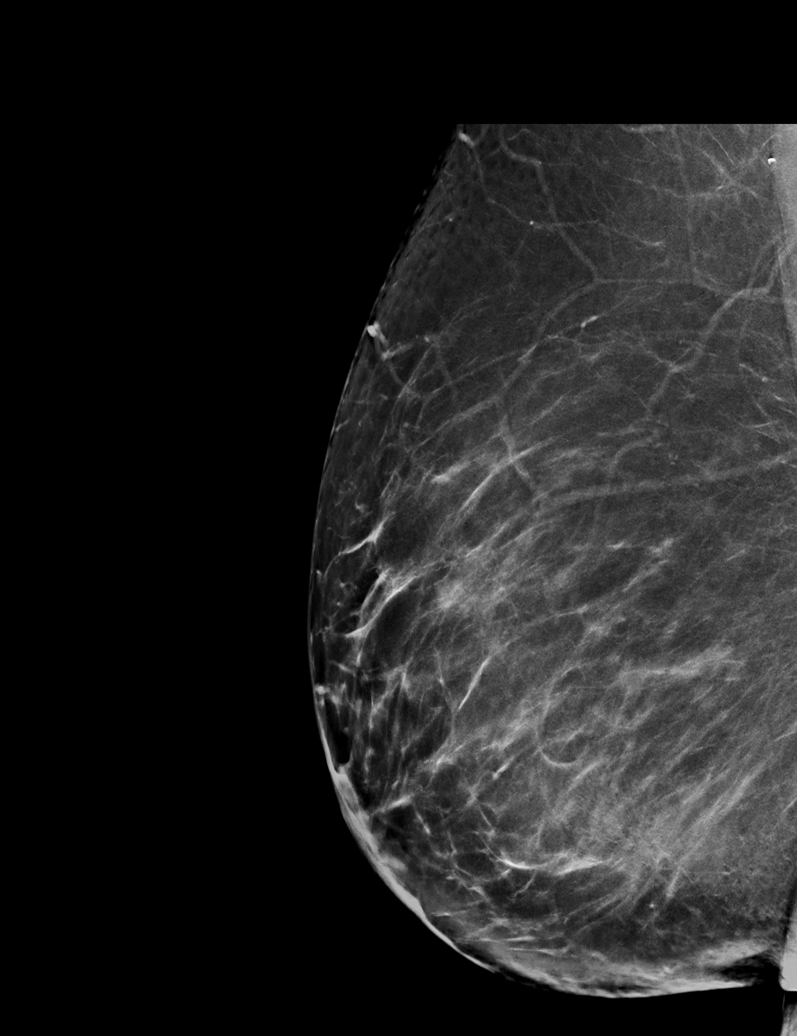

[6 of 36 positions shown; findings below may reference images not displayed]

ACR Breast Density Category b: There are scattered areas of
fibroglandular density.
FINDINGS: Additional tomograms were performed of the bilateral breast. The
initially questioned possible right breast asymmetry resolves on the
additional imaging with findings compatible with an area of
overlapping fibroglandular tissue. There is an oval circumscribed
low-density mass in the slightly inner left breast measuring 0.6 cm.
There is an oval mass in the upper-outer left breast measuring
cm.

Targeted ultrasound of the left breast was performed. There is a
cluster of complicated cysts in the left breast at 2 o'clock 3 cm
from nipple measuring 0.6 x 0.3 x 0.8 cm. This corresponds well the
mass seen in the upper-outer left breast at mammography. There is an
oval circumscribed hypoechoic mass in the left breast 11 o'clock 2
cm from nipple measuring 0.7 x 0.3 x 0.6 cm. This likely corresponds
with the mass seen in the inner left breast at mammography.
IMPRESSION: Probably benign left breast masses.

RECOMMENDATION:
Recommend six-month follow-up diagnostic mammography with ultrasound
to demonstrate stability of the probably benign left breast masses.

I have discussed the findings and recommendations with the patient.
If applicable, a reminder letter will be sent to the patient
regarding the next appointment.

BI-RADS CATEGORY  3: Probably benign.

## 2023-10-16 IMAGING — US US BREAST*L* LIMITED INC AXILLA
1 series · 13 of 16 positions shown · non-contrast
Comparison: Screening mammogram dated 03/25/2022.

CLINICAL DATA: Screening recall for bilateral breast asymmetries.

EXAM:
DIGITAL DIAGNOSTIC BILATERAL MAMMOGRAM WITH TOMOSYNTHESIS AND CAD;
ULTRASOUND LEFT BREAST LIMITED
TECHNIQUE: Bilateral digital diagnostic mammography and breast tomosynthesis
was performed. The images were evaluated with computer-aided
detection.; Targeted ultrasound examination of the left breast was
performed.

[Series 1: us breast ltd uni left inc axilla · 13 of 16 slices shown]
[im 1/16]
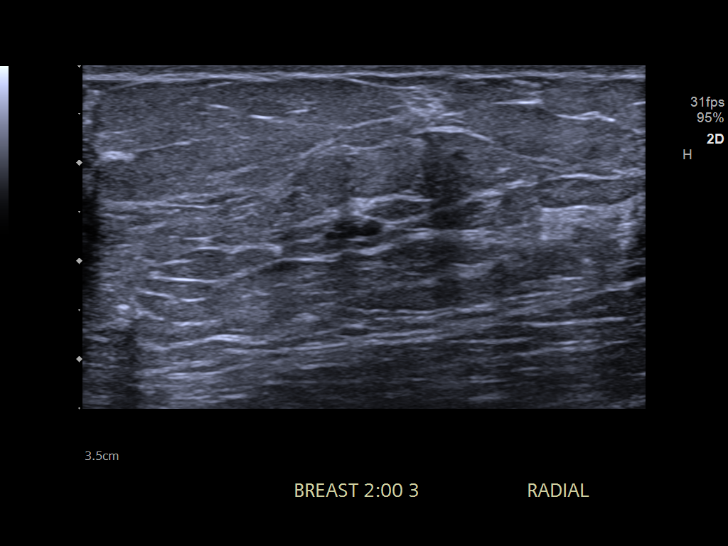
[im 2/16]
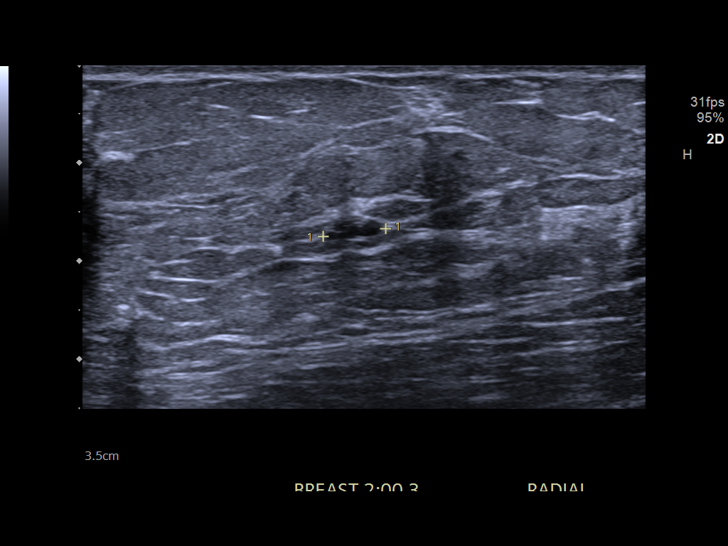
[im 4/16]
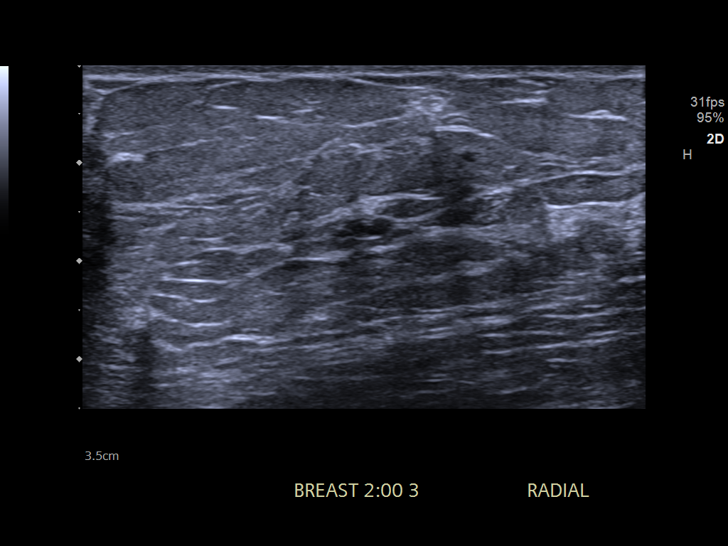
[im 5/16]
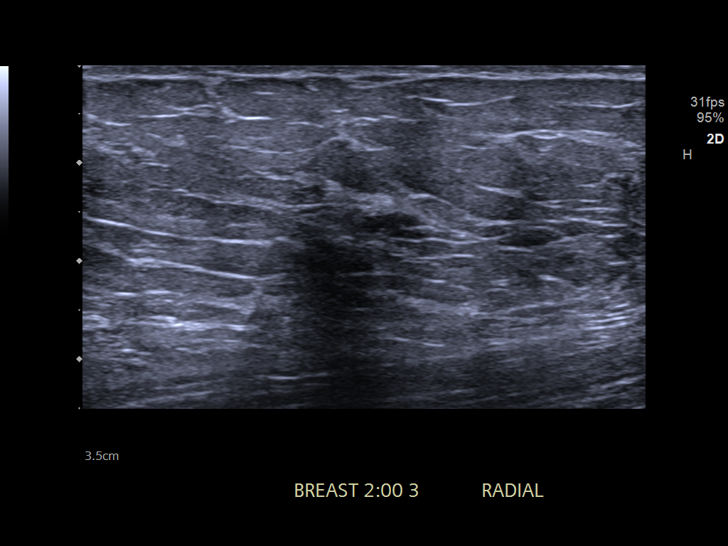
[im 6/16]
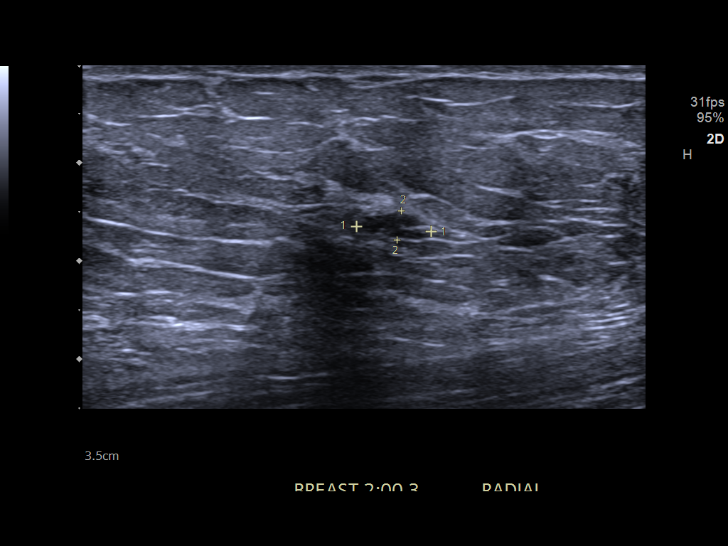
[im 7/16]
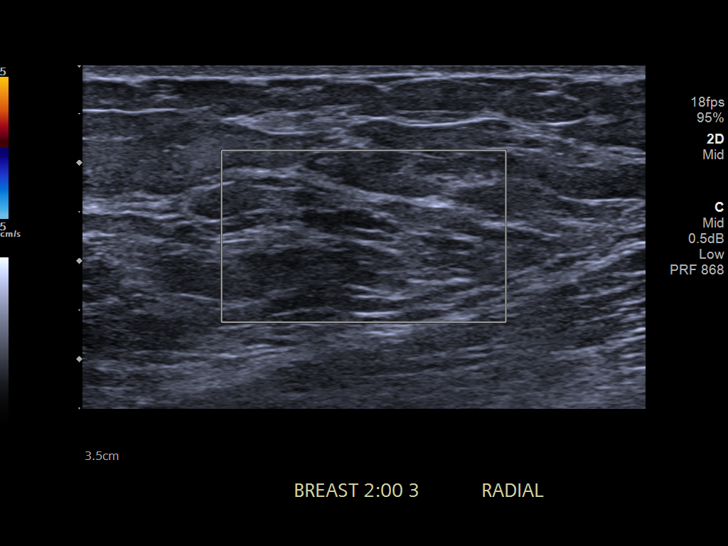
[im 9/16]
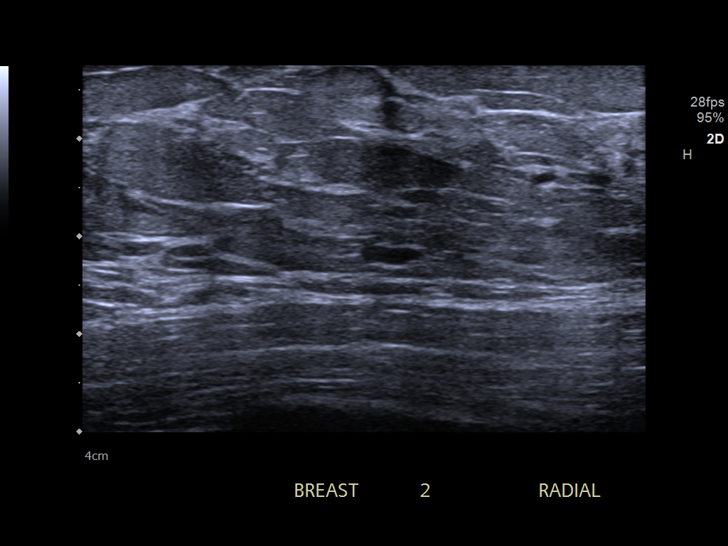
[im 10/16]
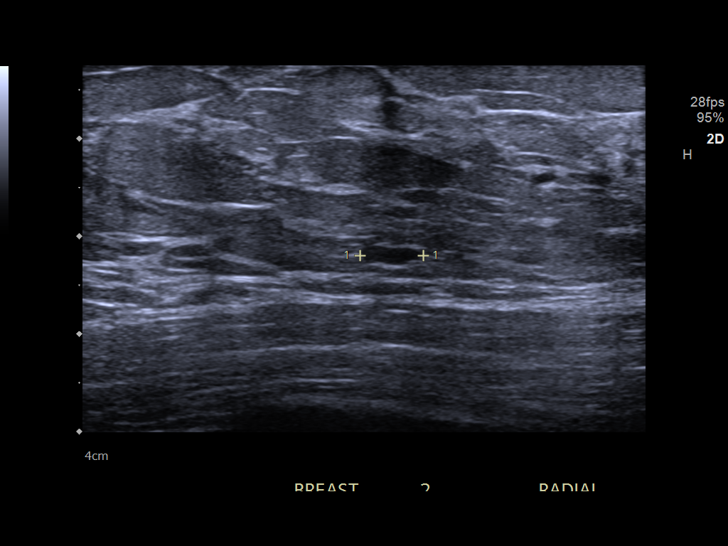
[im 11/16]
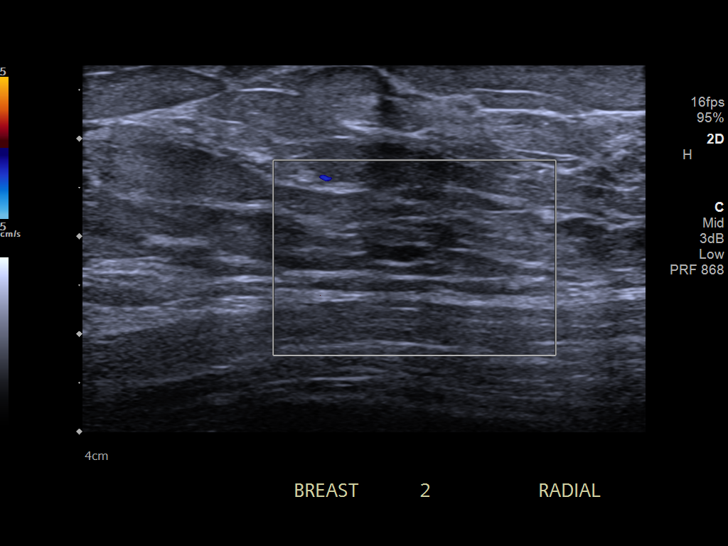
[im 12/16]
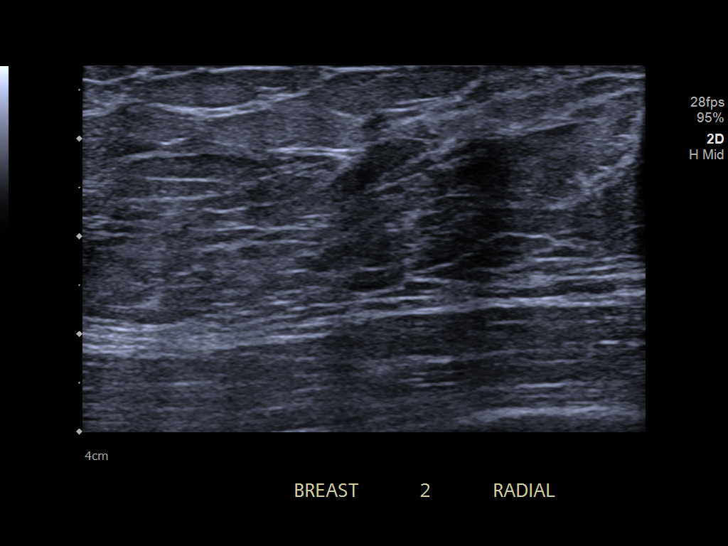
[im 13/16]
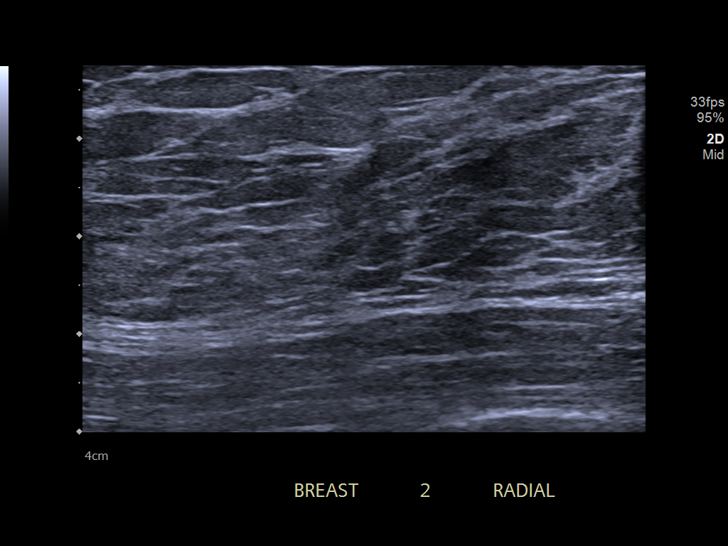
[im 15/16]
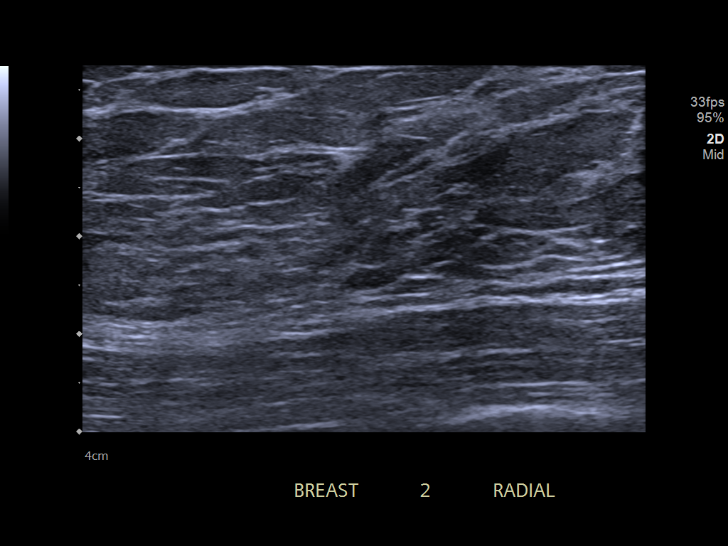
[im 16/16]
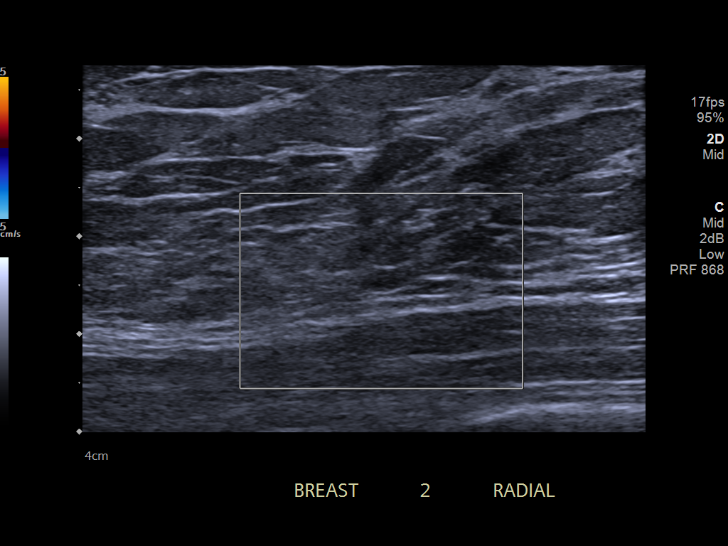

[13 of 16 positions shown; findings below may reference images not displayed]

ACR Breast Density Category b: There are scattered areas of
fibroglandular density.
FINDINGS: Additional tomograms were performed of the bilateral breast. The
initially questioned possible right breast asymmetry resolves on the
additional imaging with findings compatible with an area of
overlapping fibroglandular tissue. There is an oval circumscribed
low-density mass in the slightly inner left breast measuring 0.6 cm.
There is an oval mass in the upper-outer left breast measuring
cm.

Targeted ultrasound of the left breast was performed. There is a
cluster of complicated cysts in the left breast at 2 o'clock 3 cm
from nipple measuring 0.6 x 0.3 x 0.8 cm. This corresponds well the
mass seen in the upper-outer left breast at mammography. There is an
oval circumscribed hypoechoic mass in the left breast 11 o'clock 2
cm from nipple measuring 0.7 x 0.3 x 0.6 cm. This likely corresponds
with the mass seen in the inner left breast at mammography.
IMPRESSION: Probably benign left breast masses.

RECOMMENDATION:
Recommend six-month follow-up diagnostic mammography with ultrasound
to demonstrate stability of the probably benign left breast masses.

I have discussed the findings and recommendations with the patient.
If applicable, a reminder letter will be sent to the patient
regarding the next appointment.

BI-RADS CATEGORY  3: Probably benign.

## 2023-11-01 NOTE — Progress Notes (Unsigned)
Sent a video visit link through Epic, but the patient didn't sign in. Tried calling for today's appointment. She mentioned that she forgot the appointment and is currently driving. She asked to reschedule the appointment but was informed that it would be considered a no-show. Will be rescheduled 1/31 at 8 am.

## 2023-11-05 ENCOUNTER — Telehealth (INDEPENDENT_AMBULATORY_CARE_PROVIDER_SITE_OTHER): Payer: BC Managed Care – PPO | Admitting: Psychiatry

## 2023-11-05 DIAGNOSIS — Z91199 Patient's noncompliance with other medical treatment and regimen due to unspecified reason: Secondary | ICD-10-CM

## 2023-11-07 ENCOUNTER — Ambulatory Visit: Payer: BC Managed Care – PPO | Admitting: Family Medicine

## 2023-11-07 VITALS — BP 137/83 | HR 91 | Ht 60.0 in | Wt 304.0 lb

## 2023-11-07 DIAGNOSIS — E559 Vitamin D deficiency, unspecified: Secondary | ICD-10-CM | POA: Diagnosis not present

## 2023-11-07 DIAGNOSIS — E8881 Metabolic syndrome: Secondary | ICD-10-CM

## 2023-11-07 DIAGNOSIS — R7303 Prediabetes: Secondary | ICD-10-CM | POA: Diagnosis not present

## 2023-11-07 DIAGNOSIS — M25571 Pain in right ankle and joints of right foot: Secondary | ICD-10-CM

## 2023-11-07 DIAGNOSIS — E785 Hyperlipidemia, unspecified: Secondary | ICD-10-CM

## 2023-11-07 DIAGNOSIS — F331 Major depressive disorder, recurrent, moderate: Secondary | ICD-10-CM | POA: Insufficient documentation

## 2023-11-07 DIAGNOSIS — G8929 Other chronic pain: Secondary | ICD-10-CM

## 2023-11-07 DIAGNOSIS — F411 Generalized anxiety disorder: Secondary | ICD-10-CM | POA: Diagnosis not present

## 2023-11-07 NOTE — Patient Instructions (Addendum)
Annual exam in 3 months  Labs past due and  needed, HBA1cM CMP AND EgfR, Tsh, VIT d, Cbc, lipid panel fasting, in next 2 weeks  Mammogram to be scheduled past   at [facility] , pls schedule and message pt / call with appt info next week  It is important that you exercise regularly at least 30 minutes 5 times a week. If you develop chest pain, have severe difficulty breathing, or feel very tired, stop exercising immediately and seek medical attention  Think about what you will eat, plan ahead. Choose " clean, green, fresh or frozen" over canned, processed or packaged foods which are more sugary, salty and fatty. 70 to 75% of food eaten should be vegetables and fruit. Three meals at set times with snacks allowed between meals, but they must be fruit or vegetables. Aim to eat over a 12 hour period , example 7 am to 7 pm, and STOP after  your last meal of the day. Drink water,generally about 64 ounces per day, no other drink is as healthy. Fruit juice is best enjoyed in a healthy way, by EATING the fruit.   Thanks for choosing [facility], we consider it a privelige to serve you.

## 2023-11-10 NOTE — Assessment & Plan Note (Signed)
  Patient re-educated about  the importance of commitment to a  minimum of 150 minutes of exercise per week as able.  The importance of healthy food choices with portion control discussed, as well as eating regularly and within a 12 hour window most days. The need to choose "clean , green" food 50 to 75% of the time is discussed, as well as to make water the primary drink and set a goal of 64 ounces water daily.       11/07/2023    4:07 PM 07/04/2023    4:05 PM 04/11/2023    8:26 AM  Weight /BMI  Weight 304 lb 0.6 oz 296 lb 1.9 oz 304 lb  Height 5' (1.524 m) 5' (1.524 m) 5' (1.524 m)  BMI 59.38 kg/m2 57.83 kg/m2 59.37 kg/m2    Worsened, attempt to engage pt in seeing this as a health concern that needs attention, not very committed currently to making any changes

## 2023-11-10 NOTE — Progress Notes (Signed)
[name]     MRN: [id number]      DOB: 08-16-1981  Chief Complaint  Patient presents with   Follow-up    Follow up    HPI [name] is here for follow up and re-evaluation of chronic medical conditions, medication management and review of any available recent lab and radiology data.  Preventive health is updated, specifically  Cancer screening and Immunization.   Questions or concerns regarding consultations or procedures which the PT has had in the interim are  addressed. The PT denies any adverse reactions to current medications since the last visit.  There are no new concerns.  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 Denies sinus pressure, nasal congestion, ear pain or sore throat. Denies chest congestion, productive cough or wheezing. Denies chest pains, palpitations and leg swelling Denies abdominal pain, nausea, vomiting,diarrhea or constipation.   Denies dysuria, frequency, hesitancy or incontinence. Denies joint pain, swelling and limitation in mobility. Denies headaches, seizures, numbness, or tingling. Denies depression, anxiety or insomnia. Denies skin break down or rash.   PE  BP 137/83 (BP Location: Right Arm, Patient Position: Sitting, Cuff Size: Large)   Pulse 91   Ht 5' (1.524 m)   Wt (!) 304 lb 0.6 oz (137.9 kg)   SpO2 93%   BMI 59.38 kg/m   Patient alert and oriented and in no cardiopulmonary distress.  HEENT: No facial asymmetry, EOMI,     Neck supple .  Chest: Clear to auscultation bilaterally.  CVS: S1, S2 no murmurs, no S3.Regular rate.  ABD: Soft non tender.   Ext: No edema  MS: Adequate ROM spine, shoulders, hips and knees.  Skin:  Intact, no ulcerations or rash noted.  Psych: Good eye contact, normal affect. Memory intact not anxious or depressed appearing.  CNS: CN 2-12 intact, power,  normal throughout.no focal deficits noted.   Assessment & Plan  Morbid obesity [facility])  Patient re-educated about  the importance of commitment to a  minimum of 150 minutes of exercise per week as able.  The importance of healthy food choices with portion control discussed, as well as eating regularly and within a 12 hour window most days. The need to choose "clean , green" food 50 to 75% of the time is discussed, as well as to make water the primary drink and set a goal of 64 ounces water daily.       11/07/2023    4:07 PM 07/04/2023    4:05 PM 04/11/2023    8:26 AM  Weight /BMI  Weight 304 lb 0.6 oz 296 lb 1.9 oz 304 lb  Height 5' (1.524 m) 5' (1.524 m) 5' (1.524 m)  BMI 59.38 kg/m2 57.83 kg/m2 59.37 kg/m2    Worsened, attempt to engage pt in seeing this as a health concern that needs attention, not very committed currently to making any changes  Prediabetes Patient educated about the importance of limiting  Carbohydrate intake , the need to commit to daily physical activity for a minimum of 30 minutes , and to commit weight loss. The fact that changes in all these areas will reduce or eliminate all together the development of diabetes is stressed.  Latest Ref Rng & Units 07/04/2023    4:29 PM 01/03/2023    9:43 AM 03/21/2022    9:43 AM 12/06/2020    2:57 PM 05/02/2020    9:50 AM  Diabetic Labs  HbA1c 4.8 - 5.6 % 5.8  6.2  6.2  5.9    Chol 100 - 199 mg/dL  161  096   045   HDL >40 mg/dL  43  43   42   Calc LDL 0 - 99 mg/dL  92  96   981   Triglycerides 0 - 149 mg/dL  84  80   76   Creatinine 0.57 - 1.00 mg/dL  1.91  4.78   2.95       11/07/2023    4:07 PM 07/04/2023    4:05 PM 04/11/2023    8:41 AM 04/11/2023    8:28 AM 04/11/2023    8:26 AM 01/16/2023   10:01 AM 01/16/2023    9:30 AM  BP/Weight  Systolic BP 137 126  124 131 141 128 168  Diastolic BP 83 78 88 83 82 80 82  Wt. (Lbs) 304.04 296.12   304    BMI 59.38 kg/m2 57.83 kg/m2   59.37 kg/m2         No data to display          Updated lab needed at/ before next visit.   Dyslipidemia Hyperlipidemia:Low fat diet discussed and encouraged.   Lipid Panel  Lab Results  Component Value Date   CHOL 151 01/03/2023   HDL 43 01/03/2023   LDLCALC 92 01/03/2023   TRIG 84 01/03/2023   CHOLHDL 3.5 01/03/2023     Updated lab needed at/ before next visit.   Ankle pain, right Improved , uses meloxicam as needed, current script has expired states she  still has med  GAD (generalized anxiety disorder) Marked improvement , managed by Psych , non compliant with meds, does not feel she needs med and is fucnctionig well  Metabolic syndrome X The increased risk of cardiovascular disease associated with this diagnosis, and the need to consistently work on lifestyle to change this is discussed. Following  a  heart healthy diet ,commitment to 30 minutes of exercise at least 5 days per week, as well as control of blood sugar and cholesterol , and achieving a healthy weight are all the areas to be addressed .

## 2023-11-10 NOTE — Assessment & Plan Note (Signed)
Marked improvement , managed by Psych , non compliant with meds, does not feel she needs med and is fucnctionig well

## 2023-11-10 NOTE — Assessment & Plan Note (Signed)
Patient educated about the importance of limiting  Carbohydrate intake , the need to commit to daily physical activity for a minimum of 30 minutes , and to commit weight loss. The fact that changes in all these areas will reduce or eliminate all together the development of diabetes is stressed.      Latest Ref Rng & Units 07/04/2023    4:29 PM 01/03/2023    9:43 AM 03/21/2022    9:43 AM 12/06/2020    2:57 PM 05/02/2020    9:50 AM  Diabetic Labs  HbA1c 4.8 - 5.6 % 5.8  6.2  6.2  5.9    Chol 100 - 199 mg/dL  782  956   213   HDL >08 mg/dL  43  43   42   Calc LDL 0 - 99 mg/dL  92  96   657   Triglycerides 0 - 149 mg/dL  84  80   76   Creatinine 0.57 - 1.00 mg/dL  8.46  9.62   9.52       11/07/2023    4:07 PM 07/04/2023    4:05 PM 04/11/2023    8:41 AM 04/11/2023    8:28 AM 04/11/2023    8:26 AM 01/16/2023   10:01 AM 01/16/2023    9:30 AM  BP/Weight  Systolic BP 137 126 124 131 141 128 168  Diastolic BP 83 78 88 83 82 80 82  Wt. (Lbs) 304.04 296.12   304    BMI 59.38 kg/m2 57.83 kg/m2   59.37 kg/m2         No data to display          Updated lab needed at/ before next visit.

## 2023-11-10 NOTE — Assessment & Plan Note (Signed)
Updated lab needed at/ before next visit.

## 2023-11-10 NOTE — Assessment & Plan Note (Addendum)
Improved , uses meloxicam as needed, current script has expired states she  still has med

## 2023-11-10 NOTE — Assessment & Plan Note (Signed)
Hyperlipidemia:Low fat diet discussed and encouraged.   Lipid Panel  Lab Results  Component Value Date   CHOL 151 01/03/2023   HDL 43 01/03/2023   LDLCALC 92 01/03/2023   TRIG 84 01/03/2023   CHOLHDL 3.5 01/03/2023     Updated lab needed at/ before next visit.

## 2023-11-10 NOTE — Assessment & Plan Note (Signed)
The increased risk of cardiovascular disease associated with this diagnosis, and the need to consistently work on lifestyle to change this is discussed. Following  a  heart healthy diet ,commitment to 30 minutes of exercise at least 5 days per week, as well as control of blood sugar and cholesterol , and achieving a healthy weight are all the areas to be addressed .
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
## 2023-11-12 ENCOUNTER — Encounter: Payer: Self-pay | Admitting: Family Medicine

## 2023-11-12 LAB — CBC
Hematocrit: 36.2 % (ref 34.0–46.6)
Hemoglobin: 11.4 g/dL (ref 11.1–15.9)
MCH: 27.1 pg (ref 26.6–33.0)
MCHC: 31.5 g/dL (ref 31.5–35.7)
MCV: 86 fL (ref 79–97)
Platelets: 369 10*3/uL (ref 150–450)
RBC: 4.2 x10E6/uL (ref 3.77–5.28)
RDW: 13.2 % (ref 11.7–15.4)
WBC: 6.3 10*3/uL (ref 3.4–10.8)

## 2023-11-12 LAB — CMP14+EGFR
ALT: 14 [IU]/L (ref 0–32)
AST: 16 [IU]/L (ref 0–40)
Albumin: 3.8 g/dL — ABNORMAL LOW (ref 3.9–4.9)
Alkaline Phosphatase: 75 [IU]/L (ref 44–121)
BUN/Creatinine Ratio: 11 (ref 9–23)
BUN: 12 mg/dL (ref 6–24)
Bilirubin Total: 0.3 mg/dL (ref 0.0–1.2)
CO2: 21 mmol/L (ref 20–29)
Calcium: 8.6 mg/dL — ABNORMAL LOW (ref 8.7–10.2)
Chloride: 105 mmol/L (ref 96–106)
Creatinine, Ser: 1.07 mg/dL — ABNORMAL HIGH (ref 0.57–1.00)
Globulin, Total: 3.1 g/dL (ref 1.5–4.5)
Glucose: 117 mg/dL — ABNORMAL HIGH (ref 70–99)
Potassium: 4.1 mmol/L (ref 3.5–5.2)
Sodium: 140 mmol/L (ref 134–144)
Total Protein: 6.9 g/dL (ref 6.0–8.5)
eGFR: 66 mL/min/{1.73_m2} (ref >59)

## 2023-11-12 LAB — TSH: TSH: 1.4 u[IU]/mL (ref 0.450–4.500)

## 2023-11-12 LAB — HEMOGLOBIN A1C
Est. average glucose Bld gHb Est-mCnc: 128 mg/dL
Hgb A1c MFr Bld: 6.1 % — ABNORMAL HIGH (ref 4.8–5.6)

## 2023-11-12 LAB — VITAMIN D 25 HYDROXY (VIT D DEFICIENCY, FRACTURES): Vit D, 25-Hydroxy: 19.8 ng/mL — ABNORMAL LOW (ref 30.0–100.0)

## 2023-11-12 MED ORDER — VITAMIN D (ERGOCALCIFEROL) 1.25 MG (50000 UNIT) PO CAPS: 50000.0000 [IU] | ORAL_CAPSULE | ORAL | 2 refills | Status: DC

## 2023-11-12 NOTE — Addendum Note (Signed)
Addended by: Kerri Perches on: 11/12/2023 06:31 AM   Modules accepted: Orders

## 2023-11-14 ENCOUNTER — Telehealth (INDEPENDENT_AMBULATORY_CARE_PROVIDER_SITE_OTHER): Payer: BC Managed Care – PPO | Admitting: Psychiatry

## 2023-11-14 ENCOUNTER — Encounter: Payer: Self-pay | Admitting: Psychiatry

## 2023-11-14 DIAGNOSIS — F3341 Major depressive disorder, recurrent, in partial remission: Secondary | ICD-10-CM | POA: Diagnosis not present

## 2023-11-14 DIAGNOSIS — F411 Generalized anxiety disorder: Secondary | ICD-10-CM

## 2023-11-14 MED ORDER — SERTRALINE HCL 100 MG PO TABS: 150.0000 mg | ORAL_TABLET | Freq: Every day | ORAL | 0 refills | Status: DC

## 2024-01-07 ENCOUNTER — Other Ambulatory Visit (HOSPITAL_COMMUNITY): Payer: Self-pay

## 2024-01-08 ENCOUNTER — Encounter: Payer: Self-pay | Admitting: Pharmacy Technician

## 2024-01-08 NOTE — Telephone Encounter (Signed)
ERROR

## 2024-01-14 ENCOUNTER — Other Ambulatory Visit (HOSPITAL_COMMUNITY): Payer: Self-pay | Admitting: Family Medicine

## 2024-01-14 ENCOUNTER — Ambulatory Visit (INDEPENDENT_AMBULATORY_CARE_PROVIDER_SITE_OTHER): Payer: BC Managed Care – PPO | Admitting: Family Medicine

## 2024-01-14 ENCOUNTER — Encounter: Payer: Self-pay | Admitting: Family Medicine

## 2024-01-14 VITALS — BP 137/81 | HR 85 | Resp 16 | Ht 60.0 in | Wt 310.0 lb

## 2024-01-14 DIAGNOSIS — Z0000 Encounter for general adult medical examination without abnormal findings: Secondary | ICD-10-CM | POA: Diagnosis not present

## 2024-01-14 DIAGNOSIS — Z1231 Encounter for screening mammogram for malignant neoplasm of breast: Secondary | ICD-10-CM

## 2024-01-14 DIAGNOSIS — J309 Allergic rhinitis, unspecified: Secondary | ICD-10-CM

## 2024-01-14 DIAGNOSIS — N63 Unspecified lump in unspecified breast: Secondary | ICD-10-CM

## 2024-01-14 MED ORDER — MONTELUKAST SODIUM 10 MG PO TABS: 10.0000 mg | ORAL_TABLET | Freq: Every day | ORAL | 5 refills | Status: DC

## 2024-01-14 MED ORDER — PHENTERMINE-TOPIRAMATE ER 3.75-23 MG PO CP24: ORAL_CAPSULE | ORAL | 1 refills | Status: DC

## 2024-01-14 NOTE — Assessment & Plan Note (Signed)
Annual exam as documented. Counseling done  re healthy lifestyle involving commitment to 150 minutes exercise per week, heart healthy diet, and attaining healthy weight.The importance of adequate sleep also discussed. Changes in health habits are decided on by the patient with goals and time frames  set for achieving them. Immunization and cancer screening needs are specifically addressed at this visit. 

## 2024-01-14 NOTE — Patient Instructions (Signed)
 Follow-up in 3 months, call if you need me sooner.  Very important that you change food choices to be successful in weight loss which you need.  Qysmia  is prescribed, let me know if this is covered.  If not I will prescribe phentermine as we discussed.  Weight loss goal of 10 pounds in the next 12 weeks.  Please commit to physical activity/exercise for 30 minutes at least 5 days/week.  Mammogram to be scheduled at checkout.  Late afternoon appointment preferred.  I recommend using a health coach for support through your insurance  Nurse pls provide 1500 cal diet sheet  Form completed and returned at visit.  Thanks for choosing [facility], we consider it a privelige to serve you.  Best for spring into Summer!  Thanks for choosing [facility], we consider it a privelige to serve you.

## 2024-01-14 NOTE — Assessment & Plan Note (Signed)
 Increased symptoms with season change anticipated, singulair is prescribed

## 2024-01-14 NOTE — Assessment & Plan Note (Signed)
 Deteriorated  Patient re-educated about  the importance of commitment to a  minimum of 150 minutes of exercise per week as able.  The importance of healthy food choices with portion control discussed, as well as eating regularly and within a 12 hour window most days. The need to choose "clean , green" food 50 to 75% of the time is discussed, as well as to make water the primary drink and set a goal of 64 ounces water daily.       01/14/2024    8:06 AM 11/07/2023    4:07 PM 07/04/2023    4:05 PM  Weight /BMI  Weight 310 lb 304 lb 0.6 oz 296 lb 1.9 oz  Height 5' (1.524 m) 5' (1.524 m) 5' (1.524 m)  BMI 60.54 kg/m2 59.38 kg/m2 57.83 kg/m2    Qysmia prescribed, will change to phentermine if not covered

## 2024-01-14 NOTE — Progress Notes (Signed)
    [name]     MRN: [id number]      DOB: 12/09/1980  Chief Complaint  Patient presents with   Annual Exam    Annual Exam     HPI: Patient is in for annual physical exam. Obesity and allergies are addressed. Recent labs,  are reviewed. Immunization is reviewed , and  updated if needed.   PE: BP 137/81   Pulse 85   Resp 16   Ht 5' (1.524 m)   Wt (!) 310 lb (140.6 kg)   SpO2 97%   BMI 60.54 kg/m   Pleasant  female, alert and oriented x 3, in no cardio-pulmonary distress. Afebrile. HEENT No facial trauma or asymetry. Sinuses non tender.  Extra occullar muscles intact.. External ears normal, . Neck: supple, no adenopathy,JVD or thyromegaly.No bruits.  Chest: Clear to ascultation bilaterally.No crackles or wheezes. Non tender to palpation  Breast: Not examined, asymptomatic  Cardiovascular system; Heart sounds normal,  S1 and  S2 ,no S3.  No murmur, or thrill. Apical beat not displaced Peripheral pulses normal.  Abdomen: Soft, non tender, no organomegaly or masses. No bruits. Bowel sounds normal. No guarding, tenderness or rebound.    Musculoskeletal exam: Full ROM of spine, hips , shoulders and knees. No deformity ,swelling or crepitus noted. No muscle wasting or atrophy.   Neurologic: Cranial nerves 2 to 12 intact. Power, tone ,sensation and reflexes normal throughout. No disturbance in gait. No tremor.  Skin: Intact, no ulceration, erythema , scaling or rash noted. Pigmentation normal throughout  Psych; Normal mood and affect. Judgement and concentration normal   Assessment & Plan:  Annual physical exam Annual exam as documented. Counseling done  re healthy lifestyle involving commitment to 150 minutes exercise per week, heart healthy diet, and attaining healthy weight.The importance of adequate sleep also discussed. Changes in health habits are decided on by the patient with goals and time frames  set for achieving  them. Immunization and cancer screening needs are specifically addressed at this visit.   Morbid obesity (HCC) Deteriorated  Patient re-educated about  the importance of commitment to a  minimum of 150 minutes of exercise per week as able.  The importance of healthy food choices with portion control discussed, as well as eating regularly and within a 12 hour window most days. The need to choose "clean , green" food 50 to 75% of the time is discussed, as well as to make water the primary drink and set a goal of 64 ounces water daily.       01/14/2024    8:06 AM 11/07/2023    4:07 PM 07/04/2023    4:05 PM  Weight /BMI  Weight 310 lb 304 lb 0.6 oz 296 lb 1.9 oz  Height 5' (1.524 m) 5' (1.524 m) 5' (1.524 m)  BMI 60.54 kg/m2 59.38 kg/m2 57.83 kg/m2    Qysmia prescribed, will change to phentermine if not covered  Allergic rhinitis Increased symptoms with season change anticipated, singulair is prescribed
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
## 2024-02-06 ENCOUNTER — Encounter: Payer: Self-pay | Admitting: Family Medicine

## 2024-02-06 ENCOUNTER — Telehealth: Payer: BC Managed Care – PPO | Admitting: Psychiatry

## 2024-02-06 ENCOUNTER — Encounter: Payer: Self-pay | Admitting: Psychiatry

## 2024-02-06 DIAGNOSIS — F3341 Major depressive disorder, recurrent, in partial remission: Secondary | ICD-10-CM

## 2024-02-06 DIAGNOSIS — F411 Generalized anxiety disorder: Secondary | ICD-10-CM

## 2024-02-06 DIAGNOSIS — R632 Polyphagia: Secondary | ICD-10-CM

## 2024-02-06 MED ORDER — TOPIRAMATE 25 MG PO TABS: 25.0000 mg | ORAL_TABLET | Freq: Every day | ORAL | 1 refills | Status: DC

## 2024-02-06 NOTE — Patient Instructions (Signed)
 Continue sertraline  150 mg at night Start topiramate  25 mg at night  Next appointment- 6/6 at 8 20

## 2024-02-10 MED ORDER — PHENTERMINE HCL 37.5 MG PO TABS: 37.5000 mg | ORAL_TABLET | Freq: Every day | ORAL | 1 refills | Status: DC

## 2024-02-12 ENCOUNTER — Encounter: Payer: BC Managed Care – PPO | Admitting: Family Medicine

## 2024-02-17 ENCOUNTER — Ambulatory Visit (HOSPITAL_COMMUNITY)

## 2024-02-17 ENCOUNTER — Encounter (HOSPITAL_COMMUNITY)
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
## 2024-03-19 ENCOUNTER — Telehealth: Admitting: Psychiatry

## 2024-03-23 ENCOUNTER — Ambulatory Visit (HOSPITAL_COMMUNITY)
Admission: RE | Admit: 2024-03-23 | Discharge: 2024-03-23 | Disposition: A | Discharge status: Home / Self Care | Source: Ambulatory Visit | Attending: Family Medicine | Admitting: Family Medicine

## 2024-03-23 DIAGNOSIS — N63 Unspecified lump in unspecified breast: Secondary | ICD-10-CM | POA: Insufficient documentation
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
## 2024-03-31 ENCOUNTER — Telehealth (INDEPENDENT_AMBULATORY_CARE_PROVIDER_SITE_OTHER): Admitting: Psychiatry

## 2024-03-31 ENCOUNTER — Encounter: Payer: Self-pay | Admitting: Psychiatry

## 2024-03-31 DIAGNOSIS — F411 Generalized anxiety disorder: Secondary | ICD-10-CM | POA: Diagnosis not present

## 2024-03-31 DIAGNOSIS — R632 Polyphagia: Secondary | ICD-10-CM | POA: Diagnosis not present

## 2024-03-31 DIAGNOSIS — F3341 Major depressive disorder, recurrent, in partial remission: Secondary | ICD-10-CM

## 2024-03-31 MED ORDER — SERTRALINE HCL 100 MG PO TABS: 150.0000 mg | ORAL_TABLET | Freq: Every day | ORAL | 0 refills | Status: DC

## 2024-03-31 MED ORDER — TOPIRAMATE 50 MG PO TABS: 50.0000 mg | ORAL_TABLET | Freq: Every day | ORAL | 1 refills | Status: DC

## 2024-03-31 NOTE — Patient Instructions (Signed)
 Continue sertraline  150 mg at night Increase topiramate  50 mg at night  Next appointment- 8/13 at 4 30

## 2024-04-14 ENCOUNTER — Encounter: Payer: Self-pay | Admitting: Family Medicine

## 2024-04-14 ENCOUNTER — Ambulatory Visit: Admitting: Family Medicine

## 2024-04-14 DIAGNOSIS — Z23 Encounter for immunization: Secondary | ICD-10-CM

## 2024-04-14 DIAGNOSIS — R7303 Prediabetes: Secondary | ICD-10-CM

## 2024-04-14 DIAGNOSIS — Z13220 Encounter for screening for lipoid disorders: Secondary | ICD-10-CM | POA: Diagnosis not present

## 2024-04-14 MED ORDER — VITAMIN D (ERGOCALCIFEROL) 1.25 MG (50000 UNIT) PO CAPS: 50000.0000 [IU] | ORAL_CAPSULE | ORAL | 2 refills | Status: AC

## 2024-04-14 MED ORDER — PHENTERMINE HCL 37.5 MG PO TABS: 37.5000 mg | ORAL_TABLET | Freq: Every day | ORAL | 3 refills | Status: DC

## 2024-04-14 NOTE — Assessment & Plan Note (Signed)
 Patient educated about the importance of limiting  Carbohydrate intake , the need to commit to daily physical activity for a minimum of 30 minutes , and to commit weight loss. The fact that changes in all these areas will reduce or eliminate all together the development of diabetes is stressed.      Latest Ref Rng & Units 11/11/2023    8:57 AM 07/04/2023    4:29 PM 01/03/2023    9:43 AM 03/21/2022    9:43 AM 12/06/2020    2:57 PM  Diabetic Labs  HbA1c 4.8 - 5.6 % 6.1  5.8  6.2  6.2  5.9   Chol 100 - 199 mg/dL   848  845    HDL >60 mg/dL   43  43    Calc LDL 0 - 99 mg/dL   92  96    Triglycerides 0 - 149 mg/dL   84  80    Creatinine 0.57 - 1.00 mg/dL 8.92   9.02  9.07        04/14/2024    8:10 AM 01/14/2024    8:06 AM 11/07/2023    4:07 PM 07/04/2023    4:05 PM 04/11/2023    8:41 AM 04/11/2023    8:28 AM 04/11/2023    8:26 AM  BP/Weight  Systolic BP 134 137 137 126 124 131 141  Diastolic BP 83 81 83 78 88 83 82  Wt. (Lbs) 303 310 304.04 296.12   304  BMI 59.18 kg/m2 60.54 kg/m2 59.38 kg/m2 57.83 kg/m2   59.37 kg/m2       No data to display          Updated lab needed at/ before next visit.

## 2024-04-14 NOTE — Progress Notes (Signed)
 [name]     MRN: [id number]      DOB: 1980-11-23  Chief Complaint  Patient presents with   Medical Management of Chronic Issues    3 month follow up     HPI [name] is here for follow up and re-evaluation of chronic medical conditions, medication management and review of any available recent lab and radiology data.  Preventive health is updated, specifically  Cancer screening and Immunization.   Questions or concerns regarding consultations or procedures which the PT has had in the interim are  addressed. The PT denies any adverse reactions to current medications since the last visit.  There are no new concerns.  There are no specific complaints   ROS Denies recent fever or chills. Denies sinus pressure, nasal congestion, ear pain or sore throat. Denies chest congestion, productive cough or wheezing. Denies chest pains, palpitations and leg swelling Denies abdominal pain, nausea, vomiting,diarrhea or constipation.   Denies dysuria, frequency, hesitancy or incontinence. Denies joint pain, swelling and limitation in mobility. Denies headaches, seizures, numbness, or tingling. Denies depression, anxiety or insomnia. Denies skin break down or rash.   PE  BP 134/83   Pulse 87   Resp 18   Ht 5' (1.524 m)   Wt (!) 303 lb (137.4 kg)   LMP 04/09/2024 (Approximate)   SpO2 99%   BMI 59.18 kg/m   Patient alert and oriented and in no cardiopulmonary distress.  HEENT: No facial asymmetry, EOMI,     Neck supple .  Chest: Clear to auscultation bilaterally.  CVS: S1, S2 no murmurs, no S3.Regular rate.  ABD: Soft non tender.   Ext: No edema  MS: Adequate ROM spine, shoulders, hips and knees.  Skin: Intact, no ulcerations or rash noted.  Psych: Good eye contact, normal affect. Memory intact not anxious or depressed appearing.  CNS: CN 2-12 intact, power,  normal throughout.no focal deficits noted.   Assessment & Plan  Lipid  screening Hyperlipidemia:Low fat diet discussed and encouraged.   Lipid Panel  Lab Results  Component Value Date   CHOL 151 01/03/2023   HDL 43 01/03/2023   LDLCALC 92 01/03/2023   TRIG 84 01/03/2023   CHOLHDL 3.5 01/03/2023     Updated lab needed at/ before next visit.   Prediabetes Patient educated about the importance of limiting  Carbohydrate intake , the need to commit to daily physical activity for a minimum of 30 minutes , and to commit weight loss. The fact that changes in all these areas will reduce or eliminate all together the development of diabetes is stressed.      Latest Ref Rng & Units 11/11/2023    8:57 AM 07/04/2023    4:29 PM 01/03/2023    9:43 AM 03/21/2022    9:43 AM 12/06/2020    2:57 PM  Diabetic Labs  HbA1c 4.8 - 5.6 % 6.1  5.8  6.2  6.2  5.9   Chol 100 - 199 mg/dL   848  845    HDL >60 mg/dL   43  43    Calc LDL 0 - 99 mg/dL   92  96    Triglycerides 0 - 149 mg/dL   84  80    Creatinine 0.57 - 1.00 mg/dL 8.92   9.02  9.07        04/14/2024    8:10 AM 01/14/2024    8:06 AM 11/07/2023    4:07 PM 07/04/2023    4:05 PM 04/11/2023  8:41 AM 04/11/2023    8:28 AM 04/11/2023    8:26 AM  BP/Weight  Systolic BP 134 137 137 126 124 131 141  Diastolic BP 83 81 83 78 88 83 82  Wt. (Lbs) 303 310 304.04 296.12   304  BMI 59.18 kg/m2 60.54 kg/m2 59.38 kg/m2 57.83 kg/m2   59.37 kg/m2       No data to display          Updated lab needed at/ before next visit.   Morbid obesity (HCC)  Patient re-educated about  the importance of commitment to a  minimum of 150 minutes of exercise per week as able.  The importance of healthy food choices with portion control discussed, as well as eating regularly and within a 12 hour window most days. The need to choose clean , green food 50 to 75% of the time is discussed, as well as to make water the primary drink and set a goal of 64 ounces water daily.       04/14/2024    8:10 AM 01/14/2024    8:06 AM 11/07/2023     4:07 PM  Weight /BMI  Weight 303 lb 310 lb 304 lb 0.6 oz  Height 5' (1.524 m) 5' (1.524 m) 5' (1.524 m)  BMI 59.18 kg/m2 60.54 kg/m2 59.38 kg/m2    Continue current regime  Immunization due After obtaining informed consent, the  HPV and Hep B #1 vaccines are  administered , with no adverse effect noted at the time of administration.

## 2024-04-14 NOTE — Patient Instructions (Addendum)
 F/U in 14 to 16 weeks, call if you need me sooner  NURSE visit in 2 months for Hep B #2 and also HPV #2  CONGRATS  Continue taking meds daily and weekly as prescribed  Continue small but meaningful changes , CONSISTENTLY  Weight loss goal of 10 to 15 pounds   Labs today  It is important that you exercise regularly at least 30 minutes 5 times a week. If you develop chest pain, have severe difficulty breathing, or feel very tired, stop exercising immediately and seek medical attention    Think about what you will eat, plan ahead. Choose  clean, green, fresh or frozen over canned, processed or packaged foods which are more sugary, salty and fatty. 70 to 75% of food eaten should be vegetables and fruit. Three meals at set times with snacks allowed between meals, but they must be fruit or vegetables. Aim to eat over a 12 hour period , example 7 am to 7 pm, and STOP after  your last meal of the day. Drink water,generally about 64 ounces per day, no other drink is as healthy. Fruit juice is best enjoyed in a healthy way, by EATING the fruit. Thanks for choosing [facility], we consider it a privelige to serve you.   Hep B #1 and HPV #1 today.  Thanks for choosing [facility], we consider it a privelige to serve you.

## 2024-04-14 NOTE — Assessment & Plan Note (Signed)
 Hyperlipidemia:Low fat diet discussed and encouraged.   Lipid Panel  Lab Results  Component Value Date   CHOL 151 01/03/2023   HDL 43 01/03/2023   LDLCALC 92 01/03/2023   TRIG 84 01/03/2023   CHOLHDL 3.5 01/03/2023     Updated lab needed at/ before next visit.

## 2024-04-14 NOTE — Assessment & Plan Note (Signed)
  Patient re-educated about  the importance of commitment to a  minimum of 150 minutes of exercise per week as able.  The importance of healthy food choices with portion control discussed, as well as eating regularly and within a 12 hour window most days. The need to choose clean , green food 50 to 75% of the time is discussed, as well as to make water the primary drink and set a goal of 64 ounces water daily.       04/14/2024    8:10 AM 01/14/2024    8:06 AM 11/07/2023    4:07 PM  Weight /BMI  Weight 303 lb 310 lb 304 lb 0.6 oz  Height 5' (1.524 m) 5' (1.524 m) 5' (1.524 m)  BMI 59.18 kg/m2 60.54 kg/m2 59.38 kg/m2    Continue current regime

## 2024-04-15 ENCOUNTER — Ambulatory Visit: Payer: Self-pay | Admitting: Family Medicine

## 2024-04-15 LAB — CMP14+EGFR
ALT: 21 IU/L (ref 0–32)
AST: 18 IU/L (ref 0–40)
Albumin: 3.8 g/dL — ABNORMAL LOW (ref 3.9–4.9)
Alkaline Phosphatase: 75 IU/L (ref 44–121)
BUN/Creatinine Ratio: 12 (ref 9–23)
BUN: 12 mg/dL (ref 6–24)
Bilirubin Total: 0.3 mg/dL (ref 0.0–1.2)
CO2: 18 mmol/L — ABNORMAL LOW (ref 20–29)
Calcium: 8.6 mg/dL — ABNORMAL LOW (ref 8.7–10.2)
Chloride: 104 mmol/L (ref 96–106)
Creatinine, Ser: 1.01 mg/dL — ABNORMAL HIGH (ref 0.57–1.00)
Globulin, Total: 3.3 g/dL (ref 1.5–4.5)
Glucose: 113 mg/dL — ABNORMAL HIGH (ref 70–99)
Potassium: 4.3 mmol/L (ref 3.5–5.2)
Sodium: 138 mmol/L (ref 134–144)
Total Protein: 7.1 g/dL (ref 6.0–8.5)
eGFR: 71 mL/min/{1.73_m2} (ref >59)

## 2024-04-15 LAB — LIPID PANEL W/O CHOL/HDL RATIO
Cholesterol, Total: 154 mg/dL (ref 100–199)
HDL: 43 mg/dL (ref >39)
LDL Chol Calc (NIH): 96 mg/dL (ref 0–99)
Triglycerides: 79 mg/dL (ref 0–149)
VLDL Cholesterol Cal: 15 mg/dL (ref 5–40)

## 2024-04-15 LAB — HEMOGLOBIN A1C
Est. average glucose Bld gHb Est-mCnc: 120 mg/dL
Hgb A1c MFr Bld: 5.8 % — ABNORMAL HIGH (ref 4.8–5.6)

## 2024-04-25 DIAGNOSIS — Z23 Encounter for immunization: Secondary | ICD-10-CM | POA: Insufficient documentation

## 2024-04-25 NOTE — Assessment & Plan Note (Signed)
 After obtaining informed consent, the  HPV and Hep B #1 vaccines are  administered , with no adverse effect noted at the time of administration.
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
## 2024-05-26 ENCOUNTER — Telehealth: Admitting: Psychiatry

## 2024-06-04 ENCOUNTER — Encounter: Payer: Self-pay | Admitting: Radiology

## 2024-06-15 ENCOUNTER — Ambulatory Visit (INDEPENDENT_AMBULATORY_CARE_PROVIDER_SITE_OTHER)

## 2024-06-15 DIAGNOSIS — Z23 Encounter for immunization: Secondary | ICD-10-CM

## 2024-06-15 NOTE — Progress Notes (Signed)
 Patient is in office today for a nurse visit for Immunization HEP B. Patient Injection was given in the  Left arm. Patient tolerated injection well.  Patient is in office today for a nurse visit for Immunization HPV. Patient Injection was given in the  right arm. Patient tolerated injection well.
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
## 2024-07-14 ENCOUNTER — Encounter: Payer: Self-pay | Admitting: Psychiatry

## 2024-07-14 ENCOUNTER — Telehealth: Admitting: Psychiatry

## 2024-07-14 DIAGNOSIS — F411 Generalized anxiety disorder: Secondary | ICD-10-CM

## 2024-07-14 DIAGNOSIS — R632 Polyphagia: Secondary | ICD-10-CM | POA: Diagnosis not present

## 2024-07-14 DIAGNOSIS — F3341 Major depressive disorder, recurrent, in partial remission: Secondary | ICD-10-CM

## 2024-07-14 MED ORDER — TOPIRAMATE 50 MG PO TABS: 50.0000 mg | ORAL_TABLET | Freq: Every day | ORAL | 0 refills | Status: DC

## 2024-07-14 NOTE — Patient Instructions (Signed)
 Continue sertraline  150 mg at night Restart topiramate  50 mg at night  Next appointment- 12/10 at 4 pm

## 2024-07-21 ENCOUNTER — Ambulatory Visit: Payer: Self-pay | Admitting: Family Medicine

## 2024-08-16 ENCOUNTER — Encounter: Payer: Self-pay | Admitting: Radiology
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
## 2024-09-22 ENCOUNTER — Encounter: Payer: Self-pay | Admitting: Psychiatry

## 2024-09-22 ENCOUNTER — Telehealth (INDEPENDENT_AMBULATORY_CARE_PROVIDER_SITE_OTHER): Admitting: Psychiatry

## 2024-09-22 DIAGNOSIS — F3341 Major depressive disorder, recurrent, in partial remission: Secondary | ICD-10-CM | POA: Diagnosis not present

## 2024-09-22 DIAGNOSIS — F411 Generalized anxiety disorder: Secondary | ICD-10-CM

## 2024-09-22 DIAGNOSIS — R632 Polyphagia: Secondary | ICD-10-CM | POA: Diagnosis not present

## 2024-09-22 MED ORDER — TOPIRAMATE 100 MG PO TABS: 100.0000 mg | ORAL_TABLET | Freq: Every day | ORAL | 0 refills | Status: AC

## 2024-09-22 MED ORDER — SERTRALINE HCL 100 MG PO TABS: 100.0000 mg | ORAL_TABLET | Freq: Every day | ORAL | 0 refills | Status: AC

## 2024-09-22 NOTE — Patient Instructions (Signed)
 Decrease sertraline  100 mg at night due to concern of drowsiness Increase topiramate  100 mg at night  Next appointment- 1/23 at 8:40
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
## 2024-11-03 ENCOUNTER — Telehealth: Admitting: Psychiatry

## 2024-11-05 ENCOUNTER — Ambulatory Visit: Admitting: Family Medicine

## 2024-11-05 ENCOUNTER — Encounter: Payer: Self-pay | Admitting: Family Medicine

## 2024-11-05 DIAGNOSIS — Z13220 Encounter for screening for lipoid disorders: Secondary | ICD-10-CM

## 2024-11-05 DIAGNOSIS — F411 Generalized anxiety disorder: Secondary | ICD-10-CM

## 2024-11-05 DIAGNOSIS — E559 Vitamin D deficiency, unspecified: Secondary | ICD-10-CM

## 2024-11-05 DIAGNOSIS — Z1231 Encounter for screening mammogram for malignant neoplasm of breast: Secondary | ICD-10-CM

## 2024-11-05 DIAGNOSIS — B369 Superficial mycosis, unspecified: Secondary | ICD-10-CM

## 2024-11-05 DIAGNOSIS — R7303 Prediabetes: Secondary | ICD-10-CM | POA: Diagnosis not present

## 2024-11-05 DIAGNOSIS — F331 Major depressive disorder, recurrent, moderate: Secondary | ICD-10-CM

## 2024-11-05 DIAGNOSIS — M1710 Unilateral primary osteoarthritis, unspecified knee: Secondary | ICD-10-CM | POA: Diagnosis not present

## 2024-11-05 DIAGNOSIS — Z23 Encounter for immunization: Secondary | ICD-10-CM | POA: Diagnosis not present

## 2024-11-05 DIAGNOSIS — E0789 Other specified disorders of thyroid: Secondary | ICD-10-CM

## 2024-11-05 MED ORDER — WEGOVY 1.5 MG PO TABS: 1.5000 mg | ORAL_TABLET | Freq: Every day | ORAL | 0 refills | Status: AC

## 2024-11-05 MED ORDER — MELOXICAM 7.5 MG PO TABS: ORAL_TABLET | ORAL | 2 refills | Status: AC

## 2024-11-05 MED ORDER — NYSTATIN 100000 UNIT/GM EX CREA: TOPICAL_CREAM | CUTANEOUS | 1 refills | Status: AC

## 2024-11-05 MED ORDER — CLOTRIMAZOLE-BETAMETHASONE 1-0.05 % EX CREA: 1.0000 | TOPICAL_CREAM | Freq: Two times a day (BID) | CUTANEOUS | 1 refills | Status: AC

## 2024-11-05 MED ORDER — TERBINAFINE HCL 250 MG PO TABS: 250.0000 mg | ORAL_TABLET | Freq: Every day | ORAL | 0 refills | Status: AC

## 2024-11-05 NOTE — Assessment & Plan Note (Signed)
 After obtaining informed consent, the influenza and HPV  vaccines are   administered , with no adverse effect noted at the time of administration.

## 2024-11-05 NOTE — Progress Notes (Incomplete)
" ° °  [name]     MRN: [id number]      DOB: [birth year]  Chief Complaint  Patient presents with   Follow-up    14-16 week f/u    Rash    Rash on left side of chest has been coming and going ongoing for 1 month and a half.    HPI [name] is here for follow up and re-evaluation of chronic medical conditions, medication management and review of any available recent lab and radiology data.  Preventive health is updated, specifically  Cancer screening and Immunization.   Questions or concerns regarding consultations or procedures which the PT has had in the interim are  addressed. The PT denies any adverse reactions to current medications since the last visit.  Concern as above   ROS Denies recent fever or chills. Denies sinus pressure, nasal congestion, ear pain or sore throat. Denies chest congestion, productive cough or wheezing. Denies chest pains, palpitations and leg swelling Denies abdominal pain, nausea, vomiting,diarrhea or constipation.   Denies dysuria, frequency, hesitancy or incontinence. Denies joint pain, swelling and limitation in mobility. Denies headaches, seizures, numbness, or tingling. Denies depression, anxiety or insomnia. Itchy rash on left breast upper outer, around nipple and under breast.   PE  BP 134/84   Pulse 74   Ht 5' (1.524 m)   Wt (!) 303 lb 0.6 oz (137.5 kg)   SpO2 96%   BMI 59.18 kg/m   Patient alert and oriented and in no cardiopulmonary distress.  HEENT: No facial asymmetry, EOMI,     Neck supple .  Chest: Clear to auscultation bilaterally.  CVS: S1, S2 no murmurs, no S3.Regular rate.  ABD: Soft non tender.   Ext: No edema  MS: Adequate ROM spine, shoulders, hips and knees.  Skin: Intact, no ulcerations or rash noted.  Psych: Good eye contact, normal affect. Memory intact not anxious or depressed appearing.  CNS: CN 2-12 intact, power,  normal throughout.no focal deficits noted.   Assessment & Plan  No  problem-specific Assessment & Plan notes found for this encounter.  "

## 2024-11-05 NOTE — Patient Instructions (Addendum)
 Annual exam  in 10 to 12 weeks  Please schedule June mammogram at checkout [facility])   Flu vaccine and HPV vaccine today  New is medication for rash, 2 creams amnnd 1 tablet  New is once daily tablet, wegovy  for weight loss  Text SAVE to 83757 to activate wegovy  virtual savings  card  Labs today CBC, TSH, cmp and EGFR , hBA1C  It is important that you exercise regularly at least 30 minutes 5 times a week. If you develop chest pain, have severe difficulty breathing, or feel very tired, stop exercising immediately and seek medical attention   Think about what you will eat, plan ahead. Choose  clean, green, fresh or frozen over canned, processed or packaged foods which are more sugary, salty and fatty. 70 to 75% of food eaten should be vegetables and fruit. Three meals at set times with snacks allowed between meals, but they must be fruit or vegetables. Aim to eat over a 12 hour period , example 7 am to 7 pm, and STOP after  your last meal of the day. Drink water,generally about 64 ounces per day, no other drink is as healthy. Fruit juice is best enjoyed in a healthy way, by EATING the fruit.

## 2024-11-05 NOTE — Assessment & Plan Note (Signed)
Managed by Psych and controlled on meds

## 2024-11-05 NOTE — Assessment & Plan Note (Signed)
Managed by Psych and controlled 

## 2024-11-05 NOTE — Progress Notes (Signed)
 "  [name]     MRN: [id number]      DOB: [birth year]  Chief Complaint  Patient presents with   Follow-up    14-16 week f/u    Rash    Rash on left side of chest has been coming and going ongoing for 1 month and a half.    HPI Ms. Seppala is here for follow up and re-evaluation of chronic medical conditions, medication management and review of any available recent lab and radiology data.  Preventive health is updated, specifically  Cancer screening and Immunization.   Questions or concerns regarding consultations or procedures which the PT has had in the interim are  addressed. The PT denies any adverse reactions to current medications since the last visit.  Concern as above   ROS Denies recent fever or chills. Denies sinus pressure, nasal congestion, ear pain or sore throat. Denies chest congestion, productive cough or wheezing. Denies chest pains, palpitations and leg swelling Denies abdominal pain, nausea, vomiting,diarrhea or constipation.   Denies dysuria, frequency, hesitancy or incontinence. Denies joint pain, swelling and limitation in mobility. Denies headaches, seizures, numbness, or tingling. Denies depression, anxiety or insomnia. Itchy rash on left breast upper outer, around nipple and under breast.   PE  BP 134/84   Pulse 74   Ht 5' (1.524 m)   Wt (!) 303 lb 0.6 oz (137.5 kg)   SpO2 96%   BMI 59.18 kg/m   Patient alert and oriented and in no cardiopulmonary distress.  HEENT: No facial asymmetry, EOMI,     Neck supple .  Chest: Clear to auscultation bilaterally.  CVS: S1, S2 no murmurs, no S3.Regular rate.  ABD: Soft non tender.   Ext: No edema  MS: Adequate ROM spine, shoulders, hips and knees.  Skin: funga rash noted.on left breast   Psych: Good eye contact, normal affect. Memory intact not anxious or depressed appearing.  CNS: CN 2-12 intact, power,  normal throughout.no focal deficits noted.   Assessment & Plan  Immunization  due After obtaining informed consent, the  influenza and HPV vaccines are   administered , with no adverse effect noted at the time of administration.   GAD (generalized anxiety disorder) Managed by Psych and controlled  MDD (major depressive disorder), recurrent episode, moderate (HCC) Managed by Psych and controlled on meds  Morbid obesity (HCC)  Patient re-educated about  the importance of commitment to a  minimum of 150 minutes of exercise per week as able.  The importance of healthy food choices with portion control discussed, as well as eating regularly and within a 12 hour window most days. The need to choose clean , green food 50 to 75% of the time is discussed, as well as to make water the primary drink and set a goal of 64 ounces water daily.       11/05/2024    8:31 AM 04/14/2024    8:10 AM 01/14/2024    8:06 AM  Weight /BMI  Weight 303 lb 0.6 oz 303 lb 310 lb  Height 5' (1.524 m) 5' (1.524 m) 5' (1.524 m)  BMI 59.18 kg/m2 59.18 kg/m2 60.54 kg/m2    Unchanged, start wegovy  tab if covered  Vitamin D  deficiency Updated lab needed at/ before next visit.   Lipid screening Hyperlipidemia:Low fat diet discussed and encouraged.   Lipid Panel  Lab Results  Component Value Date   CHOL 154 04/14/2024   HDL 43 04/14/2024   LDLCALC 96 04/14/2024  TRIG 79 04/14/2024   CHOLHDL 3.5 01/03/2023       Dermatomycosis Clotrimazole  and nystatin  prescribed  "

## 2024-11-05 NOTE — Assessment & Plan Note (Signed)
" °  Patient re-educated about  the importance of commitment to a  minimum of 150 minutes of exercise per week as able.  The importance of healthy food choices with portion control discussed, as well as eating regularly and within a 12 hour window most days. The need to choose clean , green food 50 to 75% of the time is discussed, as well as to make water the primary drink and set a goal of 64 ounces water daily.       11/05/2024    8:31 AM 04/14/2024    8:10 AM 01/14/2024    8:06 AM  Weight /BMI  Weight 303 lb 0.6 oz 303 lb 310 lb  Height 5' (1.524 m) 5' (1.524 m) 5' (1.524 m)  BMI 59.18 kg/m2 59.18 kg/m2 60.54 kg/m2    Unchanged, start wegovy  tab if covered "

## 2024-11-06 ENCOUNTER — Ambulatory Visit: Payer: Self-pay | Admitting: Family Medicine

## 2024-11-06 DIAGNOSIS — M1710 Unilateral primary osteoarthritis, unspecified knee: Secondary | ICD-10-CM | POA: Insufficient documentation

## 2024-11-06 DIAGNOSIS — B369 Superficial mycosis, unspecified: Secondary | ICD-10-CM | POA: Insufficient documentation

## 2024-11-06 LAB — CMP14+EGFR
ALT: 19 [IU]/L (ref 0–32)
AST: 16 [IU]/L (ref 0–40)
Albumin: 4 g/dL (ref 3.9–4.9)
Alkaline Phosphatase: 74 [IU]/L (ref 41–116)
BUN/Creatinine Ratio: 11 (ref 9–23)
BUN: 12 mg/dL (ref 6–24)
Bilirubin Total: 0.3 mg/dL (ref 0.0–1.2)
CO2: 21 mmol/L (ref 20–29)
Calcium: 8.9 mg/dL (ref 8.7–10.2)
Chloride: 104 mmol/L (ref 96–106)
Creatinine, Ser: 1.05 mg/dL — ABNORMAL HIGH (ref 0.57–1.00)
Globulin, Total: 3 g/dL (ref 1.5–4.5)
Glucose: 103 mg/dL — ABNORMAL HIGH (ref 70–99)
Potassium: 4.7 mmol/L (ref 3.5–5.2)
Sodium: 137 mmol/L (ref 134–144)
Total Protein: 7 g/dL (ref 6.0–8.5)
eGFR: 67 mL/min/{1.73_m2}

## 2024-11-06 LAB — CBC WITH DIFFERENTIAL/PLATELET
Basophils Absolute: 0.1 10*3/uL (ref 0.0–0.2)
Basos: 1 %
EOS (ABSOLUTE): 0.2 10*3/uL (ref 0.0–0.4)
Eos: 2 %
Hematocrit: 38.1 % (ref 34.0–46.6)
Hemoglobin: 11.5 g/dL (ref 11.1–15.9)
Immature Grans (Abs): 0 10*3/uL (ref 0.0–0.1)
Immature Granulocytes: 0 %
Lymphocytes Absolute: 2.7 10*3/uL (ref 0.7–3.1)
Lymphs: 41 %
MCH: 26.1 pg — ABNORMAL LOW (ref 26.6–33.0)
MCHC: 30.2 g/dL — ABNORMAL LOW (ref 31.5–35.7)
MCV: 87 fL (ref 79–97)
Monocytes Absolute: 0.6 10*3/uL (ref 0.1–0.9)
Monocytes: 9 %
Neutrophils Absolute: 3.1 10*3/uL (ref 1.4–7.0)
Neutrophils: 47 %
Platelets: 371 10*3/uL (ref 150–450)
RBC: 4.4 x10E6/uL (ref 3.77–5.28)
RDW: 13.1 % (ref 11.7–15.4)
WBC: 6.6 10*3/uL (ref 3.4–10.8)

## 2024-11-06 LAB — HEMOGLOBIN A1C
Est. average glucose Bld gHb Est-mCnc: 123 mg/dL
Hgb A1c MFr Bld: 5.9 % — ABNORMAL HIGH (ref 4.8–5.6)

## 2024-11-06 LAB — TSH: TSH: 1.12 u[IU]/mL (ref 0.450–4.500)

## 2024-11-06 NOTE — Assessment & Plan Note (Signed)
 Clotrimazole  and nystatin  prescribed

## 2024-11-06 NOTE — Assessment & Plan Note (Signed)
 Updated lab needed at/ before next visit.

## 2024-11-06 NOTE — Assessment & Plan Note (Signed)
 Weight loss recommended and meloxicam  precribed

## 2024-11-06 NOTE — Assessment & Plan Note (Signed)
 Hyperlipidemia:Low fat diet discussed and encouraged.   Lipid Panel  Lab Results  Component Value Date   CHOL 154 04/14/2024   HDL 43 04/14/2024   LDLCALC 96 04/14/2024   TRIG 79 04/14/2024   CHOLHDL 3.5 01/03/2023

## 2024-11-09 ENCOUNTER — Other Ambulatory Visit (HOSPITAL_COMMUNITY): Payer: Self-pay

## 2024-11-09 ENCOUNTER — Telehealth: Payer: Self-pay | Admitting: Pharmacist

## 2024-11-09 MED ORDER — PHENTERMINE HCL 37.5 MG PO TABS: ORAL_TABLET | ORAL | 1 refills | Status: AC

## 2024-11-09 NOTE — Addendum Note (Signed)
 Addended by: ANTONETTA ROLLENE BRAVO on: 11/09/2024 02:45 PM   Modules accepted: Orders

## 2024-11-09 NOTE — Telephone Encounter (Signed)
 Pharmacy Patient Advocate Encounter   Received notification from [facility] that prior authorization for Wegovy  1.5mg  tablets is required/requested.   Insurance verification completed.      Per test claim: Per test claim, medication is not covered due to plan/benefit exclusion, PA not submitted at this time

## 2024-11-09 NOTE — Telephone Encounter (Signed)
 Pals let p[t know ,wegovy  tab not covered and I have precribed phentermine  half tab daily

## 2024-12-14 ENCOUNTER — Telehealth: Admitting: Psychiatry

## 2025-01-21 ENCOUNTER — Encounter: Payer: Self-pay | Admitting: Family Medicine

## 2025-03-25 ENCOUNTER — Ambulatory Visit (HOSPITAL_COMMUNITY)
